# Patient Record
Sex: Male | Born: 1935 | Race: White | Hispanic: No | Marital: Married | Smoking: Former smoker
Health system: Southern US, Community
[De-identification: ages and names within clinical notes are randomized; demographics above are authoritative.]

## PROBLEM LIST (undated history)

## (undated) DIAGNOSIS — I10 Essential (primary) hypertension: Secondary | ICD-10-CM

## (undated) DIAGNOSIS — I471 Supraventricular tachycardia, unspecified: Secondary | ICD-10-CM

## (undated) DIAGNOSIS — Z860100 Personal history of colon polyps, unspecified: Secondary | ICD-10-CM

## (undated) DIAGNOSIS — M1990 Unspecified osteoarthritis, unspecified site: Secondary | ICD-10-CM

## (undated) DIAGNOSIS — Z9289 Personal history of other medical treatment: Secondary | ICD-10-CM

## (undated) DIAGNOSIS — J302 Other seasonal allergic rhinitis: Secondary | ICD-10-CM

## (undated) DIAGNOSIS — C61 Malignant neoplasm of prostate: Secondary | ICD-10-CM

## (undated) DIAGNOSIS — K219 Gastro-esophageal reflux disease without esophagitis: Secondary | ICD-10-CM

## (undated) DIAGNOSIS — K449 Diaphragmatic hernia without obstruction or gangrene: Secondary | ICD-10-CM

## (undated) DIAGNOSIS — E7800 Pure hypercholesterolemia, unspecified: Secondary | ICD-10-CM

## (undated) DIAGNOSIS — Z950 Presence of cardiac pacemaker: Secondary | ICD-10-CM

## (undated) DIAGNOSIS — L309 Dermatitis, unspecified: Secondary | ICD-10-CM

## (undated) DIAGNOSIS — I440 Atrioventricular block, first degree: Secondary | ICD-10-CM

## (undated) HISTORY — DX: Supraventricular tachycardia: I47.1

## (undated) HISTORY — DX: Pure hypercholesterolemia, unspecified: E78.00

## (undated) HISTORY — DX: Supraventricular tachycardia, unspecified: I47.10

## (undated) HISTORY — DX: Essential (primary) hypertension: I10

## (undated) HISTORY — PX: AV NODE ABLATION: SHX1209

## (undated) HISTORY — DX: Personal history of colonic polyps: Z86.010

## (undated) HISTORY — DX: Malignant neoplasm of prostate: C61

## (undated) HISTORY — PX: PROSTATECTOMY: SHX69

## (undated) HISTORY — DX: Unspecified osteoarthritis, unspecified site: M19.90

## (undated) HISTORY — DX: Personal history of colon polyps, unspecified: Z86.0100

## (undated) HISTORY — PX: CATARACT EXTRACTION W/ INTRAOCULAR LENS  IMPLANT, BILATERAL: SHX1307

## (undated) HISTORY — DX: Atrioventricular block, first degree: I44.0

## (undated) HISTORY — DX: Dermatitis, unspecified: L30.9

## (undated) HISTORY — DX: Diaphragmatic hernia without obstruction or gangrene: K44.9

## (undated) HISTORY — PX: TONSILLECTOMY: SUR1361

---

## 1995-07-26 DIAGNOSIS — C61 Malignant neoplasm of prostate: Secondary | ICD-10-CM

## 1995-07-26 HISTORY — DX: Malignant neoplasm of prostate: C61

## 1995-07-26 HISTORY — PX: PROSTATECTOMY: SHX69

## 1999-01-27 ENCOUNTER — Inpatient Hospital Stay (HOSPITAL_COMMUNITY): Admission: EM | Admit: 1999-01-27 | Discharge: 1999-01-28 | Payer: Self-pay | Admitting: Emergency Medicine

## 2001-02-09 ENCOUNTER — Ambulatory Visit (HOSPITAL_COMMUNITY): Admission: RE | Admit: 2001-02-09 | Discharge: 2001-02-10 | Payer: Self-pay | Admitting: Internal Medicine

## 2004-07-06 ENCOUNTER — Encounter: Admission: RE | Admit: 2004-07-06 | Discharge: 2004-07-06 | Payer: Self-pay | Admitting: Family Medicine

## 2005-05-02 ENCOUNTER — Ambulatory Visit: Payer: Self-pay | Admitting: Internal Medicine

## 2005-06-17 ENCOUNTER — Ambulatory Visit: Payer: Self-pay | Admitting: Internal Medicine

## 2005-07-01 ENCOUNTER — Ambulatory Visit: Payer: Self-pay | Admitting: Internal Medicine

## 2005-07-07 ENCOUNTER — Ambulatory Visit: Payer: Self-pay | Admitting: Internal Medicine

## 2005-07-07 ENCOUNTER — Ambulatory Visit (HOSPITAL_COMMUNITY): Admission: AD | Admit: 2005-07-07 | Discharge: 2005-07-08 | Payer: Self-pay | Admitting: Internal Medicine

## 2005-07-12 ENCOUNTER — Ambulatory Visit: Payer: Self-pay | Admitting: Cardiovascular Disease

## 2005-07-12 ENCOUNTER — Ambulatory Visit: Payer: Self-pay

## 2005-07-25 HISTORY — PX: COLONOSCOPY: SHX174

## 2005-07-25 LAB — HM COLONOSCOPY

## 2005-08-23 ENCOUNTER — Ambulatory Visit: Payer: Self-pay | Admitting: Internal Medicine

## 2005-10-17 ENCOUNTER — Encounter: Payer: Self-pay | Admitting: Pulmonary Disease

## 2006-03-06 ENCOUNTER — Ambulatory Visit: Payer: Self-pay | Admitting: Internal Medicine

## 2006-03-09 ENCOUNTER — Ambulatory Visit: Payer: Self-pay | Admitting: Cardiology

## 2006-03-10 ENCOUNTER — Ambulatory Visit: Payer: Self-pay

## 2006-03-22 ENCOUNTER — Ambulatory Visit: Payer: Self-pay | Admitting: Internal Medicine

## 2006-03-31 ENCOUNTER — Ambulatory Visit: Payer: Self-pay | Admitting: Family Medicine

## 2006-04-14 ENCOUNTER — Ambulatory Visit (HOSPITAL_COMMUNITY): Admission: RE | Admit: 2006-04-14 | Discharge: 2006-04-14 | Payer: Self-pay | Admitting: Family Medicine

## 2006-05-23 ENCOUNTER — Ambulatory Visit: Payer: Self-pay | Admitting: Family Medicine

## 2006-10-04 ENCOUNTER — Ambulatory Visit: Payer: Self-pay | Admitting: Family Medicine

## 2006-10-20 ENCOUNTER — Ambulatory Visit: Payer: Self-pay | Admitting: Internal Medicine

## 2007-07-13 ENCOUNTER — Ambulatory Visit: Payer: Self-pay | Admitting: Family Medicine

## 2007-10-09 ENCOUNTER — Ambulatory Visit: Payer: Self-pay | Admitting: Family Medicine

## 2008-03-18 ENCOUNTER — Encounter: Payer: Self-pay | Admitting: Pulmonary Disease

## 2008-03-18 ENCOUNTER — Ambulatory Visit: Payer: Self-pay | Admitting: Family Medicine

## 2008-04-14 ENCOUNTER — Encounter: Payer: Self-pay | Admitting: Pulmonary Disease

## 2008-04-18 DIAGNOSIS — I471 Supraventricular tachycardia, unspecified: Secondary | ICD-10-CM | POA: Insufficient documentation

## 2008-04-18 DIAGNOSIS — R001 Bradycardia, unspecified: Secondary | ICD-10-CM | POA: Insufficient documentation

## 2008-04-18 DIAGNOSIS — I10 Essential (primary) hypertension: Secondary | ICD-10-CM | POA: Insufficient documentation

## 2008-04-21 ENCOUNTER — Ambulatory Visit: Payer: Self-pay | Admitting: Pulmonary Disease

## 2008-04-21 DIAGNOSIS — J309 Allergic rhinitis, unspecified: Secondary | ICD-10-CM | POA: Insufficient documentation

## 2008-04-21 DIAGNOSIS — R05 Cough: Secondary | ICD-10-CM

## 2008-04-21 DIAGNOSIS — E785 Hyperlipidemia, unspecified: Secondary | ICD-10-CM | POA: Insufficient documentation

## 2008-04-21 DIAGNOSIS — R059 Cough, unspecified: Secondary | ICD-10-CM | POA: Insufficient documentation

## 2008-05-05 ENCOUNTER — Ambulatory Visit: Payer: Self-pay | Admitting: Pulmonary Disease

## 2008-05-09 ENCOUNTER — Ambulatory Visit: Payer: Self-pay | Admitting: Pulmonary Disease

## 2008-06-06 ENCOUNTER — Ambulatory Visit: Payer: Self-pay | Admitting: Internal Medicine

## 2008-07-25 HISTORY — PX: TESTICLE SURGERY: SHX794

## 2008-08-01 ENCOUNTER — Ambulatory Visit: Payer: Self-pay | Admitting: Internal Medicine

## 2008-08-26 ENCOUNTER — Ambulatory Visit: Payer: Self-pay | Admitting: Family Medicine

## 2008-09-10 ENCOUNTER — Encounter (INDEPENDENT_AMBULATORY_CARE_PROVIDER_SITE_OTHER): Payer: Self-pay | Admitting: Urology

## 2008-09-10 ENCOUNTER — Ambulatory Visit (HOSPITAL_BASED_OUTPATIENT_CLINIC_OR_DEPARTMENT_OTHER): Admission: RE | Admit: 2008-09-10 | Discharge: 2008-09-10 | Payer: Self-pay | Admitting: Urology

## 2008-12-09 ENCOUNTER — Ambulatory Visit: Payer: Self-pay | Admitting: Family Medicine

## 2009-09-24 ENCOUNTER — Encounter: Admission: RE | Admit: 2009-09-24 | Discharge: 2009-09-24 | Payer: Self-pay | Admitting: Allergy and Immunology

## 2009-12-15 ENCOUNTER — Ambulatory Visit: Payer: Self-pay | Admitting: Family Medicine

## 2010-11-09 LAB — CBC
HCT: 48.2 % (ref 39.0–52.0)
Hemoglobin: 16.1 g/dL (ref 13.0–17.0)
MCHC: 33.5 g/dL (ref 30.0–36.0)
MCV: 90.1 fL (ref 78.0–100.0)
Platelets: 182 10*3/uL (ref 150–400)
RBC: 5.35 MIL/uL (ref 4.22–5.81)
RDW: 13.2 % (ref 11.5–15.5)
WBC: 6.7 10*3/uL (ref 4.0–10.5)

## 2010-11-09 LAB — BASIC METABOLIC PANEL
BUN: 21 mg/dL (ref 6–23)
CO2: 30 mEq/L (ref 19–32)
Calcium: 10.1 mg/dL (ref 8.4–10.5)
Chloride: 101 mEq/L (ref 96–112)
Creatinine, Ser: 1.04 mg/dL (ref 0.4–1.5)
GFR calc Af Amer: >60 mL/min (ref >60)
GFR calc non Af Amer: >60 mL/min (ref >60)
Glucose, Bld: 69 mg/dL — ABNORMAL LOW (ref 70–99)
Potassium: 4.6 mEq/L (ref 3.5–5.1)
Sodium: 139 mEq/L (ref 135–145)

## 2010-11-09 LAB — URINALYSIS, ROUTINE W REFLEX MICROSCOPIC
Bilirubin Urine: NEGATIVE
Glucose, UA: NEGATIVE mg/dL
Hgb urine dipstick: NEGATIVE
Ketones, ur: NEGATIVE mg/dL
Nitrite: NEGATIVE
Protein, ur: NEGATIVE mg/dL
Specific Gravity, Urine: 1.005 (ref 1.005–1.030)
Urobilinogen, UA: 0.2 mg/dL (ref 0.0–1.0)
pH: 5.5 (ref 5.0–8.0)

## 2010-12-07 NOTE — Procedures (Signed)
[facility]                              EXERCISE TREADMILL   NAME:[name], [name]                     MRN:          [id number]  DATE:08/01/2008                            DOB:          08-09-1931    PROCEDURE PERFORMED:  Exercise treadmill testing.   INDICATIONS:  Symptomatic palpitations and exercise-related dizziness  and lightheadedness.   INTRODUCTION:  The patient is a very pleasant 77 year old man with a  long history of SVT, status post ablation x2.  He has marked first-  degree heart block and PR prolongation of 500 milliseconds.  The patient  has recently developed exercise-related dizziness, the etiology of which  is unclear.  He is now referred for exercise treadmill testing, so that  we can see if we can reproduce his symptoms and better understand what  the mechanism of that might be.   PROCEDURE:  After informed consent was obtained, the patient was prepped  in the usual manner.  He was placed on the treadmill where his initial  heart rate was in the 60s and the blood pressure was 170/100.  He  exercised on a Bruce protocol completing stage III, a total of 9 minutes  exercise duration.  The patient's heart rate increased normally until  approximately 8 minutes into exercise when he went into atrial  fibrillation with a rapid ventricular response.  His blood pressure  which initially increased up to over 200 mmHg remained stable and he  after 9 minutes was allowed to stop exercising secondary to MB  discretion and that he had reached his target heart rate.  He was  allowed to recover in the usual manner and within 2 minutes of rest, the  patient went back into sinus rhythm.  His blood pressure decreased in  the normal manner.  His EKG segments demonstrated no acute ST-T wave  changes consistent with ischemia.   COMPLICATIONS:  There were no immediate procedure complications.   RESULTS:  This exercise treadmill test demonstrates no  evidence of  inducible ischemia.  It does demonstrate inducible AFib which stopped  spontaneously within a couple of minutes of exercise completion.  It  also demonstrates a somewhat hypertensive response to exercise.     Doylene Canning. Ladona Ridgel, MD  Electronically Signed    GWT/MedQ  DD: 08/01/2008  DT: 08/02/2008  Job #: [id number]

## 2010-12-07 NOTE — Op Note (Signed)
NAME:  [name], [name]              ACCOUNT NO.:  [id number]   MEDICAL RECORD NO.:  [id number]          PATIENT TYPE:  AMB   LOCATION:  [facility]                         FACILITY:  [facility]   PHYSICIAN:  Courtney Paris, M.D.DATE OF BIRTH:  Sep 30, 1931   DATE OF PROCEDURE:  09/10/2008  DATE OF DISCHARGE:                               OPERATIVE REPORT   PREOPERATIVE DIAGNOSIS:  Left spermatocele.   POSTOPERATIVE DIAGNOSES:  Left spermatocele.   OPERATION:  Left spermatocelectomy.   ANESTHESIA:  General.   SURGEON:  Courtney Paris, M.D.   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  He has had no  evidence of disease since then.  He enters now for an enlarging left  scrotal mass which, on ultrasound, seems to be a spermatocele.  It is  now 10 cm and he wants to have this corrected at this time.   The patient was placed on the operating table in the supine position.  After satisfactory induction of general endotracheal anesthesia, was  prepped and draped in the usual sterile fashion.  He was given IV Ancef.  Time-out was then performed and the patient and the procedure then  reconfirmed.   A midline incision was made over the left hemiscrotal compartment.  The  testis and large spermatocele superior to the testis was delivered.  The  cystic degeneration of the epididymis was noted.  This was completely  excised off the cord, down to the base, but the epididymis was not  removed.  The base was ligated with a 2-0 chromic tie and the sac sent  for pathological examination.   Hemostasis was carefully achieved as we were dissecting with the Bovie.  I placed the left testis back into the left hemiscrotum.  It looked  quite normal.  Closed the tunica vaginalis with a running 3-0 chromic  catgut suture and interrupted 4-0 chromic catgut for the skin.  5 mL of  0.25% Marcaine was injected into the  incision for postoperative pain  relief and  collodion applied.  Light fluff dressings were applied and a  scrotal support.   Sponge, needle and instrument counts were correct.   He was taken to the recovery room in good condition to be later  discharged as an outpatient.      Courtney Paris, M.D.  Electronically Signed    HMK/MEDQ  D:  09/10/2008  T:  09/10/2008  Job:  [id number]

## 2010-12-07 NOTE — Assessment & Plan Note (Signed)
[facility]                         ELECTROPHYSIOLOGY OFFICE NOTE   NAME:BARNESEzekiah, Massie                     MRN:          [id number]  DATE:06/06/2008                            DOB:          1932-04-28    Mr. Mcnulty returns today for followup.  He is a very pleasant 76-year-  old male with a history of SVT status post catheter ablation x2.  He has  prolongation of the PR interval, which continues to prolong.  His PR  interval today was 520 milliseconds.  The patient has not been seen by  me in over a year and half.  He has been stable, but notes over the last  several months, he has had increasing dyspnea and dizziness when he  walks uphill.  The patient plays golf and has a pull cart that he pulls  his golf uphill and when he gets this, he feels dizzy and lightheaded,  and is bothered by a cough for which he has been evaluated by the  pulmonologist, not clear quite with the etiologies.  He had no other  specific complaints today.   MEDICATIONS:  1. Pravachol 40 a day.  2. Norvasc 5 a day.  3. Allegra 180 a day.  4. Multivitamin.  5. Aspirin 81 a day.  6. Fish oil.  7. Astelin nasal spray.   PHYSICAL EXAMINATION:  GENERAL:  The patient is a pleasant 77 year old  man in no distress.  VITAL SIGNS:  Blood pressure was 126/82, the pulse is 48 and regular,  the respirations were 18, and the weight was 161 pounds.  NECK:  No jugular venous distention.  LUNGS:  Clear bilaterally to auscultation.  No wheezes, rales, or  rhonchi are present.  There is no increased work of breathing.  CARDIOVASCULAR:  Regular bradycardia.  Normal S1 and S2.  ABDOMEN:  Soft and nontender.  EXTREMITIES:  No edema.   The EKG demonstrated marked sinus bradycardia at 48 beats per minute.  The PR interval was previously noted at 520 milliseconds.   IMPRESSION:  1. Dyspnea with exertion and dizziness, question secondary to      atrioventricular Wenckebach.  2. History  of hypertension.  3. Dyslipidemia.   DISCUSSION:  Mr. Boger appeared to be present when he is exerting  himself.  It is unclear what is actually going on.  I have asked that he  undergo exercise treadmill testing to see what  happens with his AV conduction when he exercises.  I suspect he has  diastolic MR from his  long PR interval and he may ultimately require permanent pacemaker  insertion.  I will plan to see the patient back in the office after his  treadmill test.     Doylene Canning. Ladona Ridgel, MD  Electronically Signed    GWT/MedQ  DD: 06/06/2008  DT: 06/07/2008  Job #: [id number]

## 2010-12-10 NOTE — Procedures (Signed)
[facility]  Patient:    [name], [name]                     MRN: [id number] Proc. Date: 02/09/01 Adm. Date:  [id number] Attending:  Lewayne Bunting CC:         Ronnald Nian, M.D.  Madolyn Frieze Jens Som, M.D. [facility]   Procedure Report  PROCEDURE:  Electrophysiologic study and radiofrequency catheter ablation of inducible AV node reentrant tachycardia.  INTRODUCTION:  The patient is a very pleasant 77 year old man with a history of hypertension and SVT.  He has a long PR interval at baseline.  His family history is notable for hypertension.  He has had multiple episodes of SVT, increasing in frequency and severity over the last several months, and is now referred for catheter ablation.  DESCRIPTION OF PROCEDURE:  After informed consent was obtained, the patient was taken to the diagnostic EP lab in the fasted state.  After the usual preparation and draping, intravenous fentanyl and midazolam were given for sedation.  A 6 French hexapolar catheter was inserted percutaneously in the right jugular vein and advanced to the coronary sinus.  A 5 French quadripolar catheter was inserted percutaneously in the right femoral vein and advanced to the RV apex.  A 5 French quadripolar catheter was inserted percutaneously in the right femoral vein and advanced to this His bundle region.  After measurement of the basic intervals, rapid ventricular pacing was carried out from the RV apex, and this demonstrated VA dissociation.  This was at a cycle length of 600 milliseconds.  Next, rapid atrial pacing was carried out from the coronary sinus at a pacing cycle length of 490 milliseconds and stepwise decreased down to 420 milliseconds, where AV Wenckebach was observed.  During rapid atrial pacing, the PR interval remained longer than the RR interval, but there was no inducible SVT.  Next, programmed atrial stimulation was carried out from the coronary sinus at a basic  drive cycle length of 562 milliseconds. The S1-S2 interval was stepwise decreased down to 400 milliseconds, and the AV node ERP was observed.  At this point isoproterenol was given at 2 mcg/min. and additional rapid atrial pacing was carried out.  At this time, the AV Wenckebach cycle length was 350 milliseconds.  Once again the PR interval was greater than the RR interval, but there was no SVT.  Programmed atrial stimulation was carried out at a basic drive cycle length of 130 milliseconds and then stepwise decreased down to 300 milliseconds, where the ERP of the AV node was observed.  Next, rapid ventricular pacing was carried out from the RV apex at a pacing cycle length of 500 milliseconds and stepwise decreased down to 400 milliseconds, where a narrow QRS tachycardia was induced.  This was a short VA tachycardia with earliest activation occurring along the left portion of the His bundle region.  This was adjacent to the proximal coronary sinus catheter.  PVCs placed at the time of His bundle refractoriness did not demonstrate pre-excitation, and the atrial activation sequence during SVT was VAV.  At this point, the ablation catheter was maneuvered into Kochs triangle and a total of five RF energy applications were delivered from the sites 6 through 9 in Kochs triangle.  This resulted in an accelerated junctional rhythm.  At this point, additional rapid ventricular pacing and rapid atrial pacing were carried out for over 30 minutes.  This was on isoproterenol. There was no inducible  SVT, and the PR interval was now less than the RR interval.  At this point, the catheters were removed, hemostasis assured, and the patient returned to his room in satisfactory condition.  COMPLICATIONS:  None.  RESULTS:  A. BASELINE ELECTROCARDIOGRAM:  The baseline EKG demonstrates normal sinus    rhythm with first degree AV block.  B. BASELINE INTERVALS:  Sinus node cycle length was 1311 milliseconds.   The    PR interval was 320 milliseconds, the QRS duration 100 milliseconds, the    AH interval 236 milliseconds, and the HV interval 59 milliseconds.  C. RAPID ATRIAL PACING:  Rapid atrial pacing was carried out from the coronary    sinus, demonstrating an AV Wenckebach cycle length of 420 milliseconds.  On    isoproterenol, the AV Wenckebach cycle length was 350 milliseconds.  D. PROGRAMMED ATRIAL STIMULATION:  Programmed atrial stimulation was carried    out from the coronary sinus at basic drive cycle length of 604    milliseconds.  The S1-S2 interval was stepwise decreased down to 400    milliseconds, where the ERP of the AV node was observed.  It should be    noted that during programmed atrial stimulation, there was no evidence of    any fast pathway antegrade conduction until Isuprel was initiated.  E. RAPID VENTRICULAR PACING:  Rapid ventricular pacing was carried out from    the RV apex at a pacing cycle length of 600 milliseconds, and this    demonstrated VA dissociation at baseline.  Following isoproteronol    infusion, rapid atrial pacing was carried out and stepwise decreased down    to 400 milliseconds, where SVT was initiated.  Following catheter ablation    of SVT, the AV Wenckebach cycle length on isoproterenol was 330    milliseconds.  F. PROGRAMMED VENTRICULAR STIMULATION:  Programmed ventricular stimulation    during isoproterenol infusion at a basic drive cycle length of 540    milliseconds was carried out.  The S1-S2 interval was stepwise decreased    down to 240 milliseconds, where ventricular refractoriness was observed.  G. ARRHYTHMIAS OBSERVED:  AV node reentrant tachycardia.  Initiation:  Rapid    ventricular pacing.  Duration:  Sustained.  Termination:  With catheter    ablation and pacing.  H. MAPPING:  Mapping of the patients SVT demonstrated the usual Kochs    triangle anatomy.  I. RADIOFREQUENCY ABLATION:  A total of five RF energy applicatoins  were    subsequently delivered to the slow pathway region, which resulted in     accelerated junctional rhythm and the termination and inability to reinduce    SVT.  CONCLUSION:  This study demonstrates successful RF catheter ablation in a patient with symptomatic, recurrent, medically refractory SVT, with a total of five RF energy applications delivered to Kochs triangle, rendering the tachycardia noninducible.  In addition, there was no residual dual AV nodal physiology. DD:  02/09/01 TD:  02/09/01 Job: [id number]

## 2010-12-10 NOTE — Assessment & Plan Note (Signed)
 [facility]                           ELECTROPHYSIOLOGY OFFICE NOTE   NAME:BARNESAnvay, [name]                     MRN:          [id number]  DATE:03/22/2006                            DOB:          01-11-1932    [name] returns today for followup.  He is a very pleasant 77 year old  male with a history of SVT, status post two ablations with his most recent P-  R interval approximately 400 Msec.  The patient had recurrent chest pain  after I saw him two weeks ago and underwent stress testing by Dr. Antoine Poche  which demonstrated no ischemia or scar, preserved left ventricular function.  The patient did have what looks like Wenckebach and sinus rhythm while he  was exercising.  We cannot exclude a short RP tachycardia simply because we  do not have any evidence where the heart racing actually abruptly stopped or  slowed down and he has baseline marked first-degree AV block so it is  difficult to tell whether his short RP interval is from SVT or from sinus  tachycardia with marked first-degree AV block.  That all being said, he  returns for followup and overall has been well except he does continue to  have palpitations while he is on the golf course.  He denies chest pain.  He  denies peripheral edema.   PHYSICAL EXAMINATION:  GENERAL:  He is a pleasant 77 year old man in no  acute distress.  VITAL SIGNS: Blood pressure 133/75, pulse 64 and regular, respirations 18.  NECK:  No jugular venous distension.  LUNGS:  Clear bilaterally to auscultation.  CARDIOVASCULAR:  Regular rate and rhythm. Normal S1 and S2.  EXTREMITIES:  Demonstrate no edema.   LABORATORY DATA:  EKG demonstrates sinus rhythm of marked first-degree AV  block.   IMPRESSION:  1. Recurrent supraventricular tachycardia.  2. Palpitations.  3. Chest pain with exertion.  4. Status post negative exercise treadmill test.   DISCUSSION:  I suspect much of the patient's symptoms are related  to  intermittent Wenckebach with exertion though I cannot rule out bursts of  nonsustained SVT.  He does have a very markedly long P-R interval but  because he has really not been that much in the way of symptomatic.  I would  like to recommend a period of watchful waiting. If he develops recurrent  palpitations or sensation of rapid heart racing,  then I would recommend him  wear a 30-day monitor.  I will plan to see this patient back in several  months.                                   Doylene Canning. Ladona Ridgel, MD  GWT/MedQ  DD:  03/22/2006 DT:  03/23/2006 Job #:  [id number]   cc:   Sharlot Gowda, MD

## 2010-12-10 NOTE — Discharge Summary (Signed)
[name], [name] NO.:  [id number]   MEDICAL RECORD NO.:  [id number]          PATIENT TYPE:  OIB   LOCATION:  6524                         FACILITY:  [facility]   PHYSICIAN:  Duke Salvia, M.D.  DATE OF BIRTH:  05-Jul-1932   DATE OF ADMISSION:  07/07/2005  DATE OF DISCHARGE:  07/08/2005                                 DISCHARGE SUMMARY   DISCHARGING/ATTENDING:  Dr. Sherryl Manges.   ELECTROPHYSIOLOGIST:  Dr. Lewayne Bunting.   PRIMARY CARE GIVER:  Dr. Sharlot Gowda.   PRINCIPAL DIAGNOSES:  1.  Recurrence of supraventricular tachyarrhythmia (patient is status post      radiofrequency catheter ablation of AV nodal reentry tachycardia in the      past).  2.  Symptomatic tachyarrhythmia with chest pressure, dizziness, and dyspnea.  3.  Discharging day one, status post electrophysiology study, radiofrequency      catheter ablation of AV nodal reentry tachycardia.   SECONDARY DIAGNOSES:  1.  Dyslipidemia.  2.  Hypertension.   ALLERGIES:  CODEINE.   PROCEDURE THIS ADMISSION:  July 07, 2005:  Electrophysiology study,  radiofrequency catheter ablation of AV nodal reentry tachycardia without  complaint and restoring sinus rhythm.  Isopro was used to induce  supraventricular tachycardia, and slow P wave modification was effected.  The patient has had no post-procedural complications.  He does complain of a  stiff back after being in bed all night.   HISTORY AND PHYSICAL:  [name] is a 77 year old male.  He has a history  in the past of tachypalpitations and in the past underwent electrophysiology  study and a radiofrequency catheter ablation of AV nodal reentry  tachycardia.  This was about four years ago.  The patient has had recurrence  of palpitations; these are symptomatic.  They are increasing in frequency  and severity that last from 5 to 30 minutes.  He has no frank syncope, but  they are associated with shortness of breath and some back pressure.  The  patient returns for followup.  Although his symptoms are not quite so  severe, he is symptomatic with his SVT.  The patient has marked first-degree  AV block and beta-blocker and calcium-blocker therapy is contraindicated.  The patient is at risk for bradycardia after any planned ablation and may  require a pacemaker with a trans of 1 to 100.   HOSPITAL COURSE:  Patient presented electively July 07, 2005.  He  underwent EPS/RFCA with a slow P-wave modification by Dr. Lewayne Bunting.  The  patient is in sinus rhythm.  Heart rate in the 50s.  He has been  asymptomatic 16 hours after procedure.  Oxygen saturating 94% on room air.  The patient has no laboratory studies this admission.   DISCHARGE MEDICATIONS:  1.  Pravachol 40 mg daily.  2.  Norvasc 5 mg daily.  3.  Allegra 180 mg daily.  4.  Aspirin 81 mg daily.  5.  Astelin spray twice daily.   FOLLOWUP:  He has followup at [facility], [address]  Dr. Ladona Ridgel, August 23, 2005, 11:45 in the morning.  He may shower.  He is  to call [phone] if he experiences pain or swelling at the cath site.  He is  asked not to drive for the next two days and not lift any heavy weight of  more than 10 pounds for the next two weeks.   DISCHARGE DIET:  Low-salt, low-cholesterol diet.   Thirty minutes for dictation and discussion with patient.      Maple Mirza, P.A.    ______________________________  Duke Salvia, M.D.    GM/MEDQ  D:  07/08/2005  T:  07/11/2005  Job:  [id number]   cc:   Doylene Canning. Ladona Ridgel, M.D.  [address]  [address]  [place]  [place] [address]   Sharlot Gowda, M.D.  Fax: [phone]

## 2010-12-10 NOTE — Assessment & Plan Note (Signed)
[facility]                         ELECTROPHYSIOLOGY OFFICE NOTE   NAME:BARNESSecundino, Ellithorpe                     MRN:          [id number]  DATE:10/20/2006                            DOB:          1932-02-12    Mr. Petrovic returns today for followup.  He is a very pleasant 77-year-  old man with a history of symptomatic SVT status post electrophysiologic  setting catheter ablation x2, who had very marked first degree block  with a PR interval for approximately 440 milliseconds.  The patient  returns today for followup.  He has recently seen Dr. Susann Givens.  The  patient denies chest pain.  He denies shortness of breath.  He has very  minimal amount of palpitations.  He denies any rapid heart racing above  100 beats per minute.  He does have some sinus node dysfunction along  with his AV conduction system disease.   MEDICATIONS:  Include Pravachol, Norvasc, Allegra, multivitamin,  aspirin, methylsulfonymethane, fish oil, and Astelin nasal spray.   PHYSICAL EXAMINATION:  He is a pleasant, well-appearing man in no acute  distress.  The blood pressure was 129/76, the pulse 62 and regular,  respirations 18.  Weight was 159 pounds.  HEENT:  Normocephalic and atraumatic.  Pupils equal and round.  Oropharynx is moist, sclerae are anicteric.  NECK:  No jugular venous distention.  There is no thyromegaly.  Trachea  is midline.  Carotids are two plus and symmetric.  LUNGS:  No wheezes, rales or rhonchi.  There is no increased work of  breathing.  CARDIOVASCULAR:  Regular rate and rhythm with a soft S1 and a normal S2.   IMPRESSION:  1. Palpitations.  2. Sinus node dysfunction.  3. High-grade first degree block.   DISCUSSION:  Overall, Mr. Sheek is stable.  I have asked that he  continue a period of watchful waiting.  He will continue on his present  medical therapy.  There is presently no indication for a permanent  pacemaker insertion, although there may well  be a time soon rather than  later when this is required.     Doylene Canning. Ladona Ridgel, MD  Electronically Signed    GWT/MedQ  DD: 10/20/2006  DT: 10/20/2006  Job #: [id number]   cc:   Sharlot Gowda, M.D.

## 2010-12-10 NOTE — Discharge Summary (Signed)
[facility]  Patient:    [name], [name]                     MRN: [id number] Adm. Date:  [id number] Disc. Date: 02/10/01 Attending:  Lewayne Bunting Dictator:   Delton See, P.A. CC:         Madolyn Frieze. Jens Som, M.D. [facility]  Ronnald Nian, M.D.   Referring Physician Discharge [facility]  DATE OF BIRTH:  2031-12-15  HISTORY ON ADMISSION:  This is a 77 year old male, a patient of Arlys John S. Jens Som, M.D., and Ronnald Nian, M.D., who was referred to Doylene Canning. Ladona Ridgel, M.D., for evaluation of supraventricular tachycardia.  The patient had his first episode approximately three years ago.  In April of 2002, he developed increasing frequency of symptoms.  He was referred to Doylene Canning. Ladona Ridgel, M.D., for further evaluation.  He has not been on AV nodal blocking drugs secondary to his baseline first degree heart block with a PR interval of approximately 300 msec.  Radiofrequency ablation was discussed and arrangements were made to admit the patient for that procedure.  PAST MEDICAL HISTORY:  Significant for hypertension.  SOCIAL HISTORY:  The patient is married.  He is retired.  He does not use alcohol or tobacco at this time.  ALLERGIES:  No known drug allergies.  HOSPITAL COURSE:  As noted, this patient was admitted to [facility] for radiofrequency ablation of supraventricular tachycardia. He was admitted on February 09, 2001.  The procedure was performed on that day. There were no immediate complications and the procedure appeared to be successful.  Arrangements were made to discharge the patient home the following day.  Prior to discharge, the patient did have some bradycardia and some occasional accelerated junctional ventricular rhythms.  These were reviewed by Veneda Melter, M.D., prior to discharge.  It was felt that the patient was probably okay for discharge.  Follow-up was recommended with Doylene Canning. Ladona Ridgel, M.D., at which time the  patient would have an EKG to further evaluate his heart rhythm.  DISCHARGE MEDICATIONS: 1. Norvasc 5 mg daily. 2. Altace 5 mg daily. 3. Pravachol 20 mg daily. 4. Clarinex 5 mg p.r.n. 5. Aspirin 81 mg daily.  SPECIAL INSTRUCTIONS:  It was recommended that the patient be on antibiotics prior to any procedures, including dental work, bowel procedures, or surgery for at least three months.  ACTIVITY:  He was to avoid any heavy lifting or strenuous activity for four days.  He was not to drive for two days.  DIET:  He was to be on a low-salt, low-fat, low-cholesterol diet.  WOUND CARE:  He was to call the office if there were any problems with his groin.  FOLLOW-UP:  He was to follow up with his primary physician as needed or as scheduled.  Doylene Canning. Ladona Ridgel, M.D., was to see the patient on March 12, 2001, at 3:30 p.m.  PROBLEM LIST AT THE TIME OF DISCHARGE: 1. Status post radiofrequency ablation for supraventricular tachycardia. 2. History of hypertension. 3. History of elevated lipids. 4. Postprocedural bradycardia, asymptomatic, as well as occasional accelerated    junctional ventricular rhythms.  The patient was hemodynamically stable at    the time of discharge. DD:  02/10/01 TD:  02/10/01 Job: [id number]

## 2010-12-10 NOTE — Assessment & Plan Note (Signed)
[facility]                              CARDIOLOGY OFFICE NOTE   NAME:[name], [name]                     MRN:          [id number]  DATE:03/09/2006                            DOB:          09/23/1931    PRIMARY PHYSICIAN:  Dr. Sharlot Gowda.   CARDIOLOGIST:  Dr. Ladona Ridgel.   REASON FOR PRESENTATION:  Patient with neck discomfort.   HISTORY OF PRESENT ILLNESS:  The patient is a very pleasant 77 year old  gentleman who saw Dr. Ladona Ridgel earlier this week for palpitations.  He has had  a long history of supraventricular tachycardia.  He is also describing some  chest pressure that had happened about a month before.  He comes back today  added to my scheduled.  He was walking on the golf course yesterday.  He had  pain in the back of his neck.  He had a strange feeling in his chest that  was not like his palpitations.  He felt his heart was a little bit  irregular.  He then had a little difficulty walking up the hill and felt  somewhat short of breath and a little fatigued.  He was slightly dizzy  bending over to pick things up.  He did not have any presyncope or syncope.  He did not have any of his classic palpitations.  He had no chest pressure,  neck discomfort, or arm discomfort.  He felt a little bit poorly the rest of  the day and feels better today.  He had no PND or orthopnea.   PAST MEDICAL HISTORY:  1. Hypertension.  2. Supraventricular tachycardia, ablated.  3. Bradycardia.   ALLERGIES:  CODEINE.   MEDICATIONS:  1. Pravachol 40 mg.  2. Norvasc 5 mg.  3. Allegra 180 mg.  4. Multivitamin.  5. Aspirin 81 mg.  6. Glucosamine chondroitin.  7. Fish oil.  8. Flonase.   REVIEW OF SYSTEMS:  As stated in HPI, otherwise negative for all other  systems.   PHYSICAL EXAMINATION:  GENERAL:  The patient is in no distress.  VITALS:  Blood pressure 140/86.  Heart rate 54 and regular.  Weight 157  pounds.  HEENT:  Eyelids unremarkable.  Pupils  equal round and reactive to light.  Fundi not visualized.  NECK:  No jugular venous distention.  Carotid upstrokes brisk and symmetric.  No bruits or thyromegaly.  LYMPHATICS:  No adenopathy.  LUNGS:  Clear to auscultation bilaterally.  BACK:  No costovertebral angle tenderness.  CHEST:  Unremarkable.  HEART:  PMI not displaced or sustained.  S1 and S2 within normal limits.  No  S3.  No murmurs.  ABDOMEN:  Flat.  Positive bowel sounds.  Normal in frequency and pitch.  No  bruits.  No rebound, no guarding.  Normal midline pulse.  No mass or  organomegaly.  SKIN:  No rashes.  EXTREMITIES:  Pulses 2+, no edema.  EKG sinus bradycardia, rate 54.  Profound first-degree AV block, otherwise  intervals within normal limits.  No acute ST wave changes.   ASSESSMENT AND PLAN:  1. Neck discomfort.  The patient had  neck discomfort and some other      atypical symptoms.  He has minimal cardiovascular risk factors.  He has      had two negative stress perfusion studies in 2000 and 2002.  At this      point I think there is the possibility of coronary disease but think      the __________  probability is only moderate.  Therefore, he would do      best with a perfusion study.  I do not want to give adenosine with his      bradyarrhythmia but will give him an exercise test.  He had been able      to get his heart rate up to 135 a few years ago.  If he cannot exercise      adequately, we will probably have to do a cardiac catheterization.  2. Bradyarrhythmia.  He will follow up soon with Dr. Ladona Ridgel for this and      for his SVT.  3. Dr. Ladona Ridgel will see the patient back.                               Rollene Rotunda, MD, [facility]    JH/MedQ  DD:  03/09/2006  DT:  03/09/2006  Job #:  [id number]   cc:   Doylene Canning. Ladona Ridgel, MD  Sharlot Gowda, MD

## 2010-12-10 NOTE — Op Note (Signed)
[name], [name] NO.:  [id number]   MEDICAL RECORD NO.:  [id number]          PATIENT TYPE:  OIB   LOCATION:  6524                         FACILITY:  [facility]   PHYSICIAN:  Doylene Canning. Ladona Ridgel, M.D.  DATE OF BIRTH:  1931/12/10   DATE OF PROCEDURE:  07/07/2005  DATE OF DISCHARGE:  07/08/2005                                 OPERATIVE REPORT   PROCEDURE PERFORMED:  Electrophysiologic study and radiofrequency catheter  ablation of AV node reentrant tachycardia.   CARDIOLOGIST:  Doylene Canning. Ladona Ridgel, M.D.   INTRODUCTION:  The patient is a 77 year old male with a history of recurrent  tachy palpitations and documented SVT in the past.  In addition, he has had  a prolonged PR interval and underwent electrophysiologic study and catheter  ablation of his SVT several years ago.  At that time inducible AV node  reentry tachycardia at 160 beats per minute and slow pathway modification  was carried out, which demonstrated successful ablation. Following this, the  patient did well for several years but developed recurrent palpitations and  had documented SVT at rates of 130 beats per minute.  This appeared to be a  short RP tachycardia.  He is now referred for redo EP study and RF catheter  ablation all in the setting of first-degree A-V block and a PR interval 340  milliseconds.   DESCRIPTION OF PROCEDURE:  After informed consent was obtained, the patient  was taken to the diagnostic EP Lab in the fasting state.  After the usual  preparation and draping, intravenous fentanyl and midazolam was given for  sedation.  A 6-French Hexapolar catheter was inserted percutaneously in the  right jugular vein and advanced to coronary sinus.  A 5-French quadripolar  catheter was inserted percutaneously in the right femoral vein and advanced  to the RV apex.  A 5-French quadripolar catheter was inserted percutaneously  in the right femoral vein and advanced to the his bundle region.  After  measurement of the basic intervals, rapid ventricular pacing was carried out  from the RV apex demonstrating VA dissociation at 600 milliseconds. Next,  rapid atrial pacing was carried out the coronary sinus demonstrating AV  Wenckebach at 550 milliseconds.  Next programmed atrial stimulation was  carried out from the coronary sinus at base drive cycle length of 782  milliseconds, stepwise decreased down to 440 milliseconds where AV node ERP  was observed.  At this point, isoproterenol  was infused at a rate of 2 mcg  per minute and then 3 mcg per minute.  Additional rapid ventricular pacing  was carried out and then stepwise decreased down to 320 milliseconds where  VA Wenckebach was observed.  During rapid ventricular pacemaker the atrial  activation was midline and decremental.  Programmed ventricular stimulation  was carried out from the RV apex on isoproterenol at a base drive cycle  length of 956 milliseconds and stepwise decreased down to 300 milliseconds  where a retrograde AV node ERP was observed.  During programmed ventricular  stimulation, the atrial activation was midline and decremental. Next, rapid  atrial pacing was carried  out from the coronary sinus at a pacing cycle  length of 600 milliseconds and stepwise decreased down to 320 milliseconds  where AV Wenckebach was observed.  During rapid atrial pacing the PR  interval was equal to R interval but there initially no SVT.  Next,  programmed atrial stimulation was carried out the coronary sinus at a base  drive cycle length of 045 milliseconds.  The S1, S2 interval was stepwise  decreased down to 400 milliseconds resulting in the induction of SVT.  During SVT, rapid ventricular pacing demonstrated the _________  conduction  sequence and PVCs did not preexite the atrium.  Mapping demonstrated midline  atrial activation with a very short VA interval.  With all this  demonstrated, the ablation catheter was maneuvered into the  usual slow  pathway region.  This was markedly decreased in size.  A total of 10 RF  energy applications were delivered during which time there were prolonged  episodes of accelerated junctional rhythm.  Initially following ablation  there was recurrent SVT but after the final RF energy application there was  no inducible SVT and the PR interval was not significantly increased.  The  catheters were removed.  Hemostasis was assured, and the patient was  returned to his room in satisfactory condition.   COMPLICATIONS:  There were no immediate procedure complications.   RESULTS:  1.  Baseline ECG:  The baseline ECG demonstrates a sinus rhythm with marked      first-degree A-V block, 1/4 beat baseline intervals, sinus node cycle      length was 1109 milliseconds, the PR interval 362 milliseconds, the QRS      duration 128 milliseconds.  2.  Rapid ventricular pacing:  Rapid atrial pacing was carried out from the      RV apex at baseline, demonstrating VA dissociation.  On isoproterenol      rapid ventricular pacing demonstrated VA Wenckebach cycle length of 400      and 300 milliseconds.  During rapid ventricular pacing, the atrial      activation was midline decremental on isoproterenol.  3.  Programmed ventricular stimulation:  Programmed ventricular stimulation      on isoproterenol was carried out at base drive cycle length 409      milliseconds. The S1 and S2 interval was stepwise decreased down to 300      milliseconds where the retrograde AV node ERP was observed. During      programmed ventricular stimulation, the atrial activation was midline      and decremental.  4.  Rapid atrial pacing:  Rapid atrial pacing was carried out the coronary      sinus in the high right atrium with a base drive cycle length of 811      milliseconds, stepwise decreased down to 550 milliseconds where AV      Wenckebach was observed.  On isoproterenol, additional rapid atrial     pacing was carried out  demonstrating no inducible SVT.  5.  Programmed atrial stimulation:  Programmed atrial stimulation was      initially carried out the coronary sinus in the high right atrium with a      base drive cycle length of 914 milliseconds.  The S1, S2 interval was      stepwise decreased down to 440 msec where AV node ARP was observed.      During programmed atrial stimulation, there were multiple AH jumps and      echo beats noted.  Following ablation echo beats remained.  On      isoproterenol  programmed atrial stimulation at 500-420 resulted in the      initiation of SVT.  6.  Arrhythmias observed:  AV node reentrant tachycardia.  Initiation      programmed atrial stimulation on isoproterenol.  Duration was sustained.      Cycle length was 423 milliseconds and decreased down to 380      milliseconds.  The duration was sustained.  Termination was with rapid      ventricular pacing.  7.  AH mapping:  Mapping of Koch's triangle demonstrated unusually small      Koch's triangle.  8.  Radiofrequency energy application:  A total of 11 RF energy applications      were delivered which resulted in accelerated junctional rhythm.      Following ablation there is no inducible SVT.   CONCLUSION:  Study demonstrates apparent successful slow pathway  modification in a patient with marked first-degree A-V block at baseline  with inducible SVT on isoproterenol.           ______________________________  Doylene Canning. Ladona Ridgel, M.D.     GWT/MEDQ  D:  07/07/2005  T:  07/10/2005  Job:  [id number]   cc:   Sharlot Gowda, M.D.  Fax: [phone]

## 2010-12-10 NOTE — Assessment & Plan Note (Signed)
[facility]                           ELECTROPHYSIOLOGY OFFICE NOTE   NAME:[name], [name]                     MRN:          [id number]  DATE:03/06/2006                            DOB:          1931-11-14    [name] returns today for followup.  He is a very pleasant 77 year old  male with a history of recurrent SVT in the setting of a very long PR  interval.  He underwent initial catheter ablation back in 2002 and then  developed recurrent SVT back in 2006 and had re-do catheter ablation. He  returns today for followup.  He has rare palpitations lasting 10-15 seconds  at a time. He also noted he had one episode of atypical chest pressure  several weeks ago.  He did not seek medical attention. It was not related to  exertion and he remains quite active and has had no recurrent symptoms. He  denies shortness of breath.  He denies nausea or vomiting.   PHYSICAL EXAMINATION:  GENERAL:  On exam is a pleasant, well-appearing 39-  year-old man in no distress.  VITAL SIGNS:  The blood pressure was 144/90, pulse 56 and regular,  respirations 18, weight 156 pounds.  NECK:  No jugular venous distention.  LUNGS:  Clear bilaterally to auscultation.  CARDIOVASCULAR:  Regular bradycardia, normal S1 and split S2.  EXTREMITIES:  No edema.   The EKG demonstrates sinus bradycardia with marked first degree AV block.   IMPRESSION:  1. Recurrent supraventricular tachycardia status post catheter ablation      x2.  2. Recurrent palpitations (short lived and infrequent).  3. Chest pressure (atypical in a patient with multiple cardiac risk      factors).   DISCUSSION:  [name] is stable at the present time.  If he has recurrent  problems with chest pressure, particularly if they become exertional, then I  would recommend proceeding with catheterization.  He has had two stress  tests, the most recent one five years ago which demonstrated no obvious  blockage  although I suspect exercise-induced chest pressure would equal  ischemia until proven otherwise. If his heart racing returns, we would first  having him wear a cardiac monitor to see if we could capture this and I  would probably lean toward pacing him and trying more beta blockers which he  is unable to  take now as his AV conduction would certainly go away with any  additional catheter ablations for supraventricular tachycardia.                                  Doylene Canning. Ladona Ridgel, MD   GWT/MedQ  DD:  03/06/2006  DT:  03/06/2006  Job #:  [id number]   cc:   Sharlot Gowda, MD

## 2010-12-14 ENCOUNTER — Other Ambulatory Visit: Payer: Self-pay | Admitting: Family Medicine

## 2010-12-14 NOTE — Telephone Encounter (Signed)
Called pt explained he had 2 refill that were sent in 09/17/10 to call pharmacy if they dont have it to call me back

## 2010-12-21 ENCOUNTER — Encounter: Payer: Self-pay | Admitting: Family Medicine

## 2010-12-25 ENCOUNTER — Encounter: Payer: Self-pay | Admitting: Family Medicine

## 2010-12-25 DIAGNOSIS — C61 Malignant neoplasm of prostate: Secondary | ICD-10-CM | POA: Insufficient documentation

## 2010-12-25 DIAGNOSIS — Z86010 Personal history of colonic polyps: Secondary | ICD-10-CM | POA: Insufficient documentation

## 2010-12-25 DIAGNOSIS — K449 Diaphragmatic hernia without obstruction or gangrene: Secondary | ICD-10-CM | POA: Insufficient documentation

## 2010-12-25 DIAGNOSIS — E7800 Pure hypercholesterolemia, unspecified: Secondary | ICD-10-CM | POA: Insufficient documentation

## 2010-12-27 ENCOUNTER — Encounter: Payer: Self-pay | Admitting: Family Medicine

## 2010-12-27 ENCOUNTER — Ambulatory Visit (INDEPENDENT_AMBULATORY_CARE_PROVIDER_SITE_OTHER): Payer: Medicare Other | Admitting: Family Medicine

## 2010-12-27 VITALS — BP 140/90 | HR 62 | Ht 67.0 in | Wt 158.0 lb

## 2010-12-27 DIAGNOSIS — Z0000 Encounter for general adult medical examination without abnormal findings: Secondary | ICD-10-CM

## 2010-12-27 DIAGNOSIS — H259 Unspecified age-related cataract: Secondary | ICD-10-CM

## 2010-12-27 DIAGNOSIS — M1990 Unspecified osteoarthritis, unspecified site: Secondary | ICD-10-CM

## 2010-12-27 DIAGNOSIS — J301 Allergic rhinitis due to pollen: Secondary | ICD-10-CM

## 2010-12-27 DIAGNOSIS — M129 Arthropathy, unspecified: Secondary | ICD-10-CM

## 2010-12-27 DIAGNOSIS — E785 Hyperlipidemia, unspecified: Secondary | ICD-10-CM | POA: Insufficient documentation

## 2010-12-27 DIAGNOSIS — I10 Essential (primary) hypertension: Secondary | ICD-10-CM

## 2010-12-27 DIAGNOSIS — Z79899 Other long term (current) drug therapy: Secondary | ICD-10-CM

## 2010-12-27 DIAGNOSIS — Z9849 Cataract extraction status, unspecified eye: Secondary | ICD-10-CM | POA: Insufficient documentation

## 2010-12-27 DIAGNOSIS — Z8546 Personal history of malignant neoplasm of prostate: Secondary | ICD-10-CM

## 2010-12-27 LAB — COMPREHENSIVE METABOLIC PANEL
ALT: 23 U/L (ref 0–53)
BUN: 20 mg/dL (ref 6–23)
CO2: 28 mEq/L (ref 19–32)
Chloride: 103 mEq/L (ref 96–112)
Creat: 1.02 mg/dL (ref 0.50–1.35)
Glucose, Bld: 88 mg/dL (ref 70–99)
Potassium: 3.9 mEq/L (ref 3.5–5.3)
Sodium: 140 mEq/L (ref 135–145)
Total Bilirubin: 0.5 mg/dL (ref 0.3–1.2)
Total Protein: 6.9 g/dL (ref 6.0–8.3)

## 2010-12-27 LAB — POCT URINALYSIS DIPSTICK
Bilirubin, UA: NEGATIVE
Blood, UA: 5
Glucose, UA: NEGATIVE
Leukocytes, UA: NEGATIVE
Nitrite, UA: NEGATIVE
Protein, UA: NEGATIVE
Spec Grav, UA: 1.025
Urobilinogen, UA: NEGATIVE
pH, UA: NEGATIVE

## 2010-12-27 LAB — CBC WITH DIFFERENTIAL/PLATELET
Basophils Absolute: 0 10*3/uL (ref 0.0–0.1)
Eosinophils Absolute: 0.1 10*3/uL (ref 0.0–0.7)
HCT: 46.8 % (ref 39.0–52.0)
Lymphocytes Relative: 13 % (ref 12–46)
Lymphs Abs: 0.9 10*3/uL (ref 0.7–4.0)
MCH: 29.9 pg (ref 26.0–34.0)
Monocytes Absolute: 0.5 10*3/uL (ref 0.1–1.0)
Monocytes Relative: 7 % (ref 3–12)
Neutro Abs: 5.4 10*3/uL (ref 1.7–7.7)
Neutrophils Relative %: 79 % — ABNORMAL HIGH (ref 43–77)
Platelets: 166 10*3/uL (ref 150–400)
RBC: 5.28 MIL/uL (ref 4.22–5.81)
WBC: 6.8 10*3/uL (ref 4.0–10.5)

## 2010-12-27 LAB — LIPID PANEL
Cholesterol: 184 mg/dL (ref 0–200)
HDL: 53 mg/dL (ref >39)
LDL Cholesterol: 108 mg/dL — ABNORMAL HIGH (ref 0–99)
Total CHOL/HDL Ratio: 3.5 Ratio
Triglycerides: 115 mg/dL (ref <150)
VLDL: 23 mg/dL (ref 0–40)

## 2010-12-27 MED ORDER — AMLODIPINE BESYLATE 5 MG PO TABS: 5.0000 mg | ORAL_TABLET | Freq: Every day | ORAL | Status: DC

## 2010-12-27 MED ORDER — RANITIDINE HCL 300 MG PO TABS: 300.0000 mg | ORAL_TABLET | Freq: Every day | ORAL | Status: DC

## 2010-12-27 NOTE — Progress Notes (Signed)
  Subjective:    Patient ID: [name], male    DOB: 1931/08/22, 77 y.o.   MRN: [id number]  HPI He is here for a complete examination. He does have a lesion on his left ear that he would like me to look at. He continues to be followed by his ophthalmologist and does have cataracts. He also continues to have difficulty with his breathing but seems to respond well to ranitidine and Prilosec. He continues to be followed by his cardiologist for bradycardia. His social history was reviewed. His social history was reviewed His allergies seem to be under good control. He has a remote history of prostate cancer dating to 4. He has no difficulty with his hiatus hernia.   Review of Systems  Constitutional: Negative.   HENT: Negative.   Eyes: Positive for visual disturbance.  Respiratory: Positive for chest tightness and shortness of breath.   Cardiovascular: Negative.   Gastrointestinal: Negative.   Genitourinary: Negative.   Musculoskeletal: Positive for arthralgias.  Neurological: Negative.        Objective:   Physical Exam BP 140/90  Pulse 62  Ht 5\' 7"  (1.702 m)  Wt 158 lb (71.668 kg)  BMI 24.75 kg/m2  General Appearance:    Alert, cooperative, no distress, appears stated age  Head:    Normocephalic, without obvious abnormality, atraumatic  Eyes:    PERRL, conjunctiva/corneas clear, EOM's intact, fundi   Not evaluated due to cataracts   Ears:    Normal TM's and external ear canals.the left upper earlobe does have a small healing lesion present on the.   Nose:   Nares normal, mucosa normal, no drainage or sinus   tenderness  Throat:   Lips, mucosa, and tongue normal; teeth and gums normal  Neck:   Supple, no lymphadenopathy;  thyroid:  no   enlargement/tenderness/nodules; no carotid   bruit or JVD  Back:    Spine nontender, no curvature, ROM normal, no CVA     tenderness  Lungs:     Clear to auscultation bilaterally without wheezes, rales or     ronchi; respirations unlabored    Chest Wall:    No tenderness or deformity   Heart:    slow rate and rhythm, S1 and S2 normal, no murmur, rub   or gallop  Breast Exam:    No chest wall tenderness, masses or gynecomastia  Abdomen:     Soft, non-tender, nondistended, normoactive bowel sounds,    no masses, no hepatosplenomegaly  Genitalia:    Normal male external genitalia without lesions.  Testicles without masses.  No inguinal hernias.     Extremities:   No clubbing, cyanosis or edema  Pulses:   2+ and symmetric all extremities  Skin:   Skin color, texture, turgor normal, no rashes or lesions  Lymph nodes:   Cervical, supraclavicular, and axillary nodes normal  Neurologic:   CNII-XII intact, normal strength, sensation and gait; reflexes 2+ and symmetric throughout          Psych:   Normal mood, affect, hygiene and grooming.           Assessment & Plan:  Left ear lesion. Cataracts. Bradycardia. History of prostate cancer. Arthritis. Hypertension. Routine blood screening. Followup with the ophthalmologist. Continue on present medication regimen and remained quite active. Recheck here in a month if the ear lesion has not cleared.

## 2010-12-27 NOTE — Patient Instructions (Signed)
Continue on your present medications. Return here as needed.

## 2010-12-28 ENCOUNTER — Telehealth: Payer: Self-pay

## 2010-12-28 NOTE — Telephone Encounter (Signed)
Left message that labs are good

## 2010-12-28 NOTE — Telephone Encounter (Signed)
Talked with wife told her I would mail a copy of his labs

## 2011-01-24 ENCOUNTER — Other Ambulatory Visit: Payer: Self-pay

## 2011-01-24 MED ORDER — OMEPRAZOLE 20 MG PO CPDR: 20.0000 mg | DELAYED_RELEASE_CAPSULE | Freq: Every day | ORAL | Status: DC

## 2011-02-03 ENCOUNTER — Ambulatory Visit (INDEPENDENT_AMBULATORY_CARE_PROVIDER_SITE_OTHER): Payer: Medicare Other | Admitting: Family Medicine

## 2011-02-03 ENCOUNTER — Encounter: Payer: Self-pay | Admitting: Family Medicine

## 2011-02-03 VITALS — BP 120/84 | HR 66 | Wt 157.0 lb

## 2011-02-03 DIAGNOSIS — S5011XA Contusion of right forearm, initial encounter: Secondary | ICD-10-CM

## 2011-02-03 DIAGNOSIS — M79609 Pain in unspecified limb: Secondary | ICD-10-CM

## 2011-02-03 DIAGNOSIS — S5010XA Contusion of unspecified forearm, initial encounter: Secondary | ICD-10-CM

## 2011-02-03 DIAGNOSIS — H6190 Disorder of external ear, unspecified, unspecified ear: Secondary | ICD-10-CM

## 2011-02-03 DIAGNOSIS — M79605 Pain in left leg: Secondary | ICD-10-CM

## 2011-02-03 NOTE — Patient Instructions (Signed)
Keep track of his symptoms concerning the leg and call me if this recurs.

## 2011-02-03 NOTE — Progress Notes (Signed)
  Subjective:    Patient ID: [name], male    DOB: 04-30-1932, 77 y.o.   MRN: [id number]  HPI He is here for a consult concerning multiple issues. The lesion on his left ear still present. He also noted difficulty with inability to use his left leg, however it was transient lasting several minutes. During that time he was able to hold onto a shopping cart and move around without difficulty. The symptoms went away entirely. He also notes an episode of right forearm spasm however after this occurred he did go play golf which did slightly interfere with his ability to play. After that he noted some swelling and ecchymosis in the forearm. No history of injury to the elbow or forearm.    Review of Systems      exam of the left ear does show asmall healing lesion present over the earlobe.    Physical Exam     Exam of the left earlobe does show a 0.5 cm lesion superior on the earlobe. It is slightly erythematous. Exam of the right forearm does show an ecchymosis medially as well as on the distal humerus. Full motion of the elbow. No history of recent injury. Left leg exam shows full motion of the hip. Normal sensation and motor.   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 The etiology of his other symptoms is unclear. I reassured him that I did not think he was in any danger with the left leg. It did not sound like TIA or CVA. He could possibly have injured his forearm without knowing it but at this time I would not pursue any further evaluation. Dermatology referral will be made.

## 2011-05-05 ENCOUNTER — Telehealth: Payer: Self-pay | Admitting: Family Medicine

## 2011-05-05 NOTE — Telephone Encounter (Signed)
Called and informed the dad about dr

## 2011-05-05 NOTE — Telephone Encounter (Signed)
Have her try calling some of the rheumatologists. If she has a primary care doctor that's the best place to start

## 2011-06-20 ENCOUNTER — Telehealth: Payer: Self-pay | Admitting: Family Medicine

## 2011-06-20 MED ORDER — PRAVASTATIN SODIUM 40 MG PO TABS: 40.0000 mg | ORAL_TABLET | Freq: Every day | ORAL | Status: DC

## 2011-06-20 NOTE — Telephone Encounter (Signed)
Refilled pt med for 90 day

## 2011-06-30 ENCOUNTER — Telehealth: Payer: Self-pay | Admitting: Family Medicine

## 2011-06-30 MED ORDER — PRAVASTATIN SODIUM 40 MG PO TABS: 40.0000 mg | ORAL_TABLET | Freq: Every day | ORAL | Status: DC

## 2011-06-30 NOTE — Telephone Encounter (Signed)
Let him know that I called in the prescription

## 2011-06-30 NOTE — Telephone Encounter (Signed)
He was given a 30 day supply to hold them until the 90 day prescription comes in.

## 2011-06-30 NOTE — Telephone Encounter (Signed)
PT INFORMED

## 2011-08-08 ENCOUNTER — Encounter (INDEPENDENT_AMBULATORY_CARE_PROVIDER_SITE_OTHER): Payer: Medicare Other | Admitting: Ophthalmology

## 2011-08-11 ENCOUNTER — Encounter (INDEPENDENT_AMBULATORY_CARE_PROVIDER_SITE_OTHER): Payer: Medicare Other | Admitting: Ophthalmology

## 2011-08-11 DIAGNOSIS — H16049 Marginal corneal ulcer, unspecified eye: Secondary | ICD-10-CM

## 2011-08-11 DIAGNOSIS — H43819 Vitreous degeneration, unspecified eye: Secondary | ICD-10-CM

## 2011-08-11 DIAGNOSIS — H33309 Unspecified retinal break, unspecified eye: Secondary | ICD-10-CM

## 2011-08-11 DIAGNOSIS — D3130 Benign neoplasm of unspecified choroid: Secondary | ICD-10-CM

## 2011-09-13 ENCOUNTER — Telehealth: Payer: Self-pay | Admitting: Family Medicine

## 2011-09-13 MED ORDER — PRAVASTATIN SODIUM 40 MG PO TABS: 40.0000 mg | ORAL_TABLET | Freq: Every day | ORAL | Status: DC

## 2011-09-13 NOTE — Telephone Encounter (Signed)
Med sent in.

## 2011-12-13 ENCOUNTER — Telehealth: Payer: Self-pay | Admitting: Family Medicine

## 2011-12-13 MED ORDER — PRAVASTATIN SODIUM 40 MG PO TABS: 40.0000 mg | ORAL_TABLET | Freq: Every day | ORAL | Status: DC

## 2011-12-13 NOTE — Telephone Encounter (Signed)
Pt req refill for Pravastatin 40 mg 1 qd   With 90 day supply to [facility]

## 2011-12-13 NOTE — Telephone Encounter (Signed)
Med sent in.

## 2011-12-13 NOTE — Telephone Encounter (Signed)
Sent med in pt will need office visit before anymore refills

## 2011-12-28 ENCOUNTER — Encounter: Payer: Self-pay | Admitting: Internal Medicine

## 2012-01-03 ENCOUNTER — Telehealth: Payer: Self-pay | Admitting: Family Medicine

## 2012-01-03 ENCOUNTER — Ambulatory Visit (INDEPENDENT_AMBULATORY_CARE_PROVIDER_SITE_OTHER): Payer: Medicare Other | Admitting: Family Medicine

## 2012-01-03 ENCOUNTER — Encounter: Payer: Self-pay | Admitting: Family Medicine

## 2012-01-03 VITALS — BP 122/80 | HR 58 | Ht 67.0 in | Wt 159.0 lb

## 2012-01-03 DIAGNOSIS — M1990 Unspecified osteoarthritis, unspecified site: Secondary | ICD-10-CM

## 2012-01-03 DIAGNOSIS — K219 Gastro-esophageal reflux disease without esophagitis: Secondary | ICD-10-CM

## 2012-01-03 DIAGNOSIS — Z79899 Other long term (current) drug therapy: Secondary | ICD-10-CM

## 2012-01-03 DIAGNOSIS — E785 Hyperlipidemia, unspecified: Secondary | ICD-10-CM

## 2012-01-03 DIAGNOSIS — J301 Allergic rhinitis due to pollen: Secondary | ICD-10-CM

## 2012-01-03 DIAGNOSIS — Z23 Encounter for immunization: Secondary | ICD-10-CM

## 2012-01-03 DIAGNOSIS — M129 Arthropathy, unspecified: Secondary | ICD-10-CM

## 2012-01-03 DIAGNOSIS — I10 Essential (primary) hypertension: Secondary | ICD-10-CM

## 2012-01-03 LAB — POCT URINALYSIS DIPSTICK
Bilirubin, UA: NEGATIVE
Blood, UA: NEGATIVE
Glucose, UA: NEGATIVE
Ketones, UA: NEGATIVE
Nitrite, UA: NEGATIVE
Spec Grav, UA: 1.025

## 2012-01-03 LAB — CBC WITH DIFFERENTIAL/PLATELET
Eosinophils Absolute: 0.1 10*3/uL (ref 0.0–0.7)
Eosinophils Relative: 1 % (ref 0–5)
HCT: 50.5 % (ref 39.0–52.0)
Hemoglobin: 16.6 g/dL (ref 13.0–17.0)
Lymphs Abs: 0.9 10*3/uL (ref 0.7–4.0)
MCH: 29.7 pg (ref 26.0–34.0)
MCHC: 32.9 g/dL (ref 30.0–36.0)
Monocytes Absolute: 0.6 10*3/uL (ref 0.1–1.0)
Monocytes Relative: 9 % (ref 3–12)
Neutro Abs: 5.7 10*3/uL (ref 1.7–7.7)
Neutrophils Relative %: 78 % — ABNORMAL HIGH (ref 43–77)
Platelets: 202 10*3/uL (ref 150–400)
RBC: 5.59 MIL/uL (ref 4.22–5.81)
WBC: 7.3 10*3/uL (ref 4.0–10.5)

## 2012-01-03 MED ORDER — PRAVASTATIN SODIUM 40 MG PO TABS: 40.0000 mg | ORAL_TABLET | Freq: Every day | ORAL | Status: DC

## 2012-01-03 MED ORDER — FLUTICASONE PROPIONATE 50 MCG/ACT NA SUSP: 2.0000 | Freq: Every day | NASAL | Status: DC

## 2012-01-03 MED ORDER — OMEPRAZOLE 20 MG PO CPDR: 20.0000 mg | DELAYED_RELEASE_CAPSULE | Freq: Every day | ORAL | Status: DC

## 2012-01-03 MED ORDER — AMLODIPINE BESYLATE 5 MG PO TABS: 5.0000 mg | ORAL_TABLET | Freq: Every day | ORAL | Status: DC

## 2012-01-03 NOTE — Progress Notes (Signed)
  Subjective:    Patient ID: [name], male    DOB: [birth year], 78 y.o.   MRN: [id number]  HPI He is here for an interval evaluation. He does have daily difficulty with rhinorrhea and sneezing but usually occurs in the morning. He also has arthritis and does use Aleve in low doses with good results. He continues on his blood pressure medication and is having no difficulty with this. His reflux is responding quite nicely to Prilosec. He continues on Pravachol and has had no difficulty with this. Keeps himself quite physically active. His social history was reviewed. He does play a lot of golf. His marriage is going well. He also has remote history of prostate cancer. He also has a history of SVT with ablation.   Review of Systems Negative except as above    Objective:   Physical Exam BP 122/80  Pulse 58  Ht 5\' 7"  (1.702 m)  Wt 159 lb (72.122 kg)  BMI 24.90 kg/m2  SpO2 97%  General Appearance:    Alert, cooperative, no distress, appears stated age  Head:    Normocephalic, without obvious abnormality, atraumatic  Eyes:    PERRL, conjunctiva/corneas clear, EOM's intact, fundi    benign  Ears:    Normal TM's and external ear canals  Nose:   Nares normal, mucosa normal, no drainage or sinus   tenderness  Throat:   Lips, mucosa, and tongue normal; teeth and gums normal  Neck:   Supple, no lymphadenopathy;  thyroid:  no   enlargement/tenderness/nodules; no carotid   bruit or JVD  Back:    Spine nontender, no curvature, ROM normal, no CVA     tenderness  Lungs:     Clear to auscultation bilaterally without wheezes, rales or     ronchi; respirations unlabored  Chest Wall:    No tenderness or deformity   Heart:    Regular rate and rhythm, S1 and S2 normal, no murmur, rub   or gallop  Breast Exam:    No chest wall tenderness, masses or gynecomastia  Abdomen:     Soft, non-tender, nondistended, normoactive bowel sounds,    no masses, no hepatosplenomegaly  Genitalia:   deferred     Rectal:   deferred  Extremities:   No clubbing, cyanosis or edema  Pulses:   2+ and symmetric all extremities  Skin:   Skin color, texture, turgor normal, no rashes or lesions  Lymph nodes:   Cervical, supraclavicular, and axillary nodes normal  Neurologic:   CNII-XII intact, normal strength, sensation and gait; reflexes 2+ and symmetric throughout          Psych:   Normal mood, affect, hygiene and grooming.           Assessment & Plan:   1. HYPERTENSION  CBC with Differential, Comprehensive metabolic panel, amLODipine (NORVASC) 5 MG tablet, POCT Urinalysis Dipstick  2. HYPERLIPIDEMIA  Lipid panel, pravastatin (PRAVACHOL) 40 MG tablet  3. Arthritis    4. Allergic rhinitis due to pollen    5. Encounter for long-term (current) use of other medications  CBC with Differential, Comprehensive metabolic panel, Lipid panel  6. GERD (gastroesophageal reflux disease)  omeprazole (PRILOSEC) 20 MG capsule  Continue with his very active lifestyle and continue on all present medications.

## 2012-01-03 NOTE — Telephone Encounter (Signed)
flonase sent in per pt request

## 2012-01-03 NOTE — Telephone Encounter (Signed)
Med sent in.

## 2012-01-03 NOTE — Telephone Encounter (Signed)
Sent med in 

## 2012-01-03 NOTE — Patient Instructions (Signed)
Keep taking good care of yourself 

## 2012-01-04 LAB — COMPREHENSIVE METABOLIC PANEL
ALT: 26 U/L (ref 0–53)
Albumin: 4.8 g/dL (ref 3.5–5.2)
CO2: 28 mEq/L (ref 19–32)
Glucose, Bld: 90 mg/dL (ref 70–99)
Potassium: 5 mEq/L (ref 3.5–5.3)
Sodium: 143 mEq/L (ref 135–145)
Total Bilirubin: 0.5 mg/dL (ref 0.3–1.2)
Total Protein: 7.5 g/dL (ref 6.0–8.3)

## 2012-01-04 LAB — LIPID PANEL
Cholesterol: 209 mg/dL — ABNORMAL HIGH (ref 0–200)
LDL Cholesterol: 132 mg/dL — ABNORMAL HIGH (ref 0–99)
Total CHOL/HDL Ratio: 3.8 Ratio
Triglycerides: 110 mg/dL (ref <150)
VLDL: 22 mg/dL (ref 0–40)

## 2012-03-20 ENCOUNTER — Telehealth: Payer: Self-pay | Admitting: Family Medicine

## 2012-03-20 MED ORDER — RANITIDINE HCL 300 MG PO TABS: 300.0000 mg | ORAL_TABLET | Freq: Every day | ORAL | Status: DC

## 2012-03-20 NOTE — Telephone Encounter (Signed)
Ranitidine renewed

## 2012-05-16 ENCOUNTER — Telehealth: Payer: Self-pay | Admitting: Family Medicine

## 2012-05-16 NOTE — Telephone Encounter (Signed)
Make sure he is on aspirin and have him set up a followup appointment with me

## 2012-05-17 ENCOUNTER — Ambulatory Visit (INDEPENDENT_AMBULATORY_CARE_PROVIDER_SITE_OTHER): Payer: Medicare Other | Admitting: Family Medicine

## 2012-05-17 ENCOUNTER — Encounter: Payer: Self-pay | Admitting: Family Medicine

## 2012-05-17 VITALS — BP 112/70 | HR 55 | Wt 160.0 lb

## 2012-05-17 DIAGNOSIS — Z8679 Personal history of other diseases of the circulatory system: Secondary | ICD-10-CM

## 2012-05-17 DIAGNOSIS — G459 Transient cerebral ischemic attack, unspecified: Secondary | ICD-10-CM

## 2012-05-17 NOTE — Progress Notes (Signed)
  Subjective:    Patient ID: [name], male    DOB: 08-Aug-1931, 78 y.o.   MRN: [id number]  HPI Two days ago he had the onset of blurred vision and dizziness on the right. He says it lasted approximately 2 minutes. He had no speech difficulty, double vision, weakness, heart rate changes. No headache after the blurred vision. He also has a history of approximately one year ago he had an episode of difficulty using his right leg that again lasted for several minutes. He does have a history of SVT bradycardia but notes no heart rate issues surrounding this. He continues on 81 mg aspirin per day.   Review of Systems     Objective:   Physical Exam alert and in no distress. EOMI. No carotid bruits noted. DTRs normal. Tympanic membranes and canals are normal. Throat is clear. Tonsils are normal. Neck is supple without adenopathy or thyromegaly. Cardiac exam shows a regular sinus rhythm without murmurs or gallops. Lungs are clear to auscultation.        Assessment & Plan:   1. SUPRAVENTRICULAR TACHYCARDIA, HX OF   2. TIA (transient ischemic attack)    suggested possibly increasing his aspirin to adult dosing. Also recommended that if he has symptoms again and last more than an hour, he should go directly to the emergency room

## 2012-08-09 ENCOUNTER — Telehealth: Payer: Self-pay | Admitting: Family Medicine

## 2012-08-09 DIAGNOSIS — K219 Gastro-esophageal reflux disease without esophagitis: Secondary | ICD-10-CM

## 2012-08-09 MED ORDER — OMEPRAZOLE 20 MG PO CPDR: 20.0000 mg | DELAYED_RELEASE_CAPSULE | Freq: Every day | ORAL | Status: DC

## 2012-08-09 NOTE — Telephone Encounter (Signed)
[facility] to [facility] per pt request

## 2012-08-13 ENCOUNTER — Ambulatory Visit (INDEPENDENT_AMBULATORY_CARE_PROVIDER_SITE_OTHER): Payer: Medicare Other | Admitting: Ophthalmology

## 2012-08-17 ENCOUNTER — Telehealth: Payer: Self-pay | Admitting: Internal Medicine

## 2012-08-17 MED ORDER — FLUTICASONE PROPIONATE 50 MCG/ACT NA SUSP: 2.0000 | Freq: Every day | NASAL | Status: DC

## 2012-08-17 NOTE — Telephone Encounter (Signed)
SENT IN [facility] FOR PT

## 2012-09-12 ENCOUNTER — Telehealth: Payer: Self-pay | Admitting: Family Medicine

## 2012-09-12 NOTE — Telephone Encounter (Signed)
Pt called and stated he needs refills on meds. I checked his appointments and he had cpe in June and states he usually just comes in once a year. Pt needs norvasc, zantac, pravastatin and prilosec. He has a NEW PHARMACY. He uses [facility] and the fax number is [phone].

## 2012-09-13 ENCOUNTER — Other Ambulatory Visit: Payer: Self-pay

## 2012-09-13 DIAGNOSIS — K219 Gastro-esophageal reflux disease without esophagitis: Secondary | ICD-10-CM

## 2012-09-13 DIAGNOSIS — I10 Essential (primary) hypertension: Secondary | ICD-10-CM

## 2012-09-13 DIAGNOSIS — E785 Hyperlipidemia, unspecified: Secondary | ICD-10-CM

## 2012-09-13 MED ORDER — AMLODIPINE BESYLATE 5 MG PO TABS: 5.0000 mg | ORAL_TABLET | Freq: Every day | ORAL | Status: DC

## 2012-09-13 MED ORDER — PRAVASTATIN SODIUM 40 MG PO TABS: 40.0000 mg | ORAL_TABLET | Freq: Every day | ORAL | Status: DC

## 2012-09-13 MED ORDER — OMEPRAZOLE 20 MG PO CPDR: 20.0000 mg | DELAYED_RELEASE_CAPSULE | Freq: Every day | ORAL | Status: DC

## 2012-09-13 MED ORDER — RANITIDINE HCL 300 MG PO TABS: 300.0000 mg | ORAL_TABLET | Freq: Every day | ORAL | Status: DC

## 2012-09-13 NOTE — Telephone Encounter (Signed)
SENT MEDS IN  

## 2012-09-21 NOTE — Telephone Encounter (Signed)
done

## 2012-09-26 ENCOUNTER — Telehealth: Payer: Self-pay | Admitting: Family Medicine

## 2012-09-26 MED ORDER — DESONIDE 0.05 % EX CREA: TOPICAL_CREAM | Freq: Two times a day (BID) | CUTANEOUS | Status: DC

## 2012-09-26 NOTE — Telephone Encounter (Signed)
Pt called and stated that he needed a refill on a medication that was originally prescribed by Dr. Terri Piedra. He stated that you have told him in the past the you would take care of all his medications regardless of original pre scriber. The medication is Desonide .05 cream. [facility] uses [facility] on [address].

## 2013-01-15 ENCOUNTER — Ambulatory Visit (INDEPENDENT_AMBULATORY_CARE_PROVIDER_SITE_OTHER): Payer: MEDICARE | Admitting: Family Medicine

## 2013-01-15 ENCOUNTER — Encounter: Payer: Self-pay | Admitting: Family Medicine

## 2013-01-15 VITALS — BP 114/70 | HR 63 | Ht 67.0 in | Wt 163.0 lb

## 2013-01-15 DIAGNOSIS — Z8679 Personal history of other diseases of the circulatory system: Secondary | ICD-10-CM

## 2013-01-15 DIAGNOSIS — R05 Cough: Secondary | ICD-10-CM

## 2013-01-15 DIAGNOSIS — M129 Arthropathy, unspecified: Secondary | ICD-10-CM

## 2013-01-15 DIAGNOSIS — E785 Hyperlipidemia, unspecified: Secondary | ICD-10-CM

## 2013-01-15 DIAGNOSIS — J309 Allergic rhinitis, unspecified: Secondary | ICD-10-CM

## 2013-01-15 DIAGNOSIS — M1990 Unspecified osteoarthritis, unspecified site: Secondary | ICD-10-CM

## 2013-01-15 DIAGNOSIS — K219 Gastro-esophageal reflux disease without esophagitis: Secondary | ICD-10-CM

## 2013-01-15 DIAGNOSIS — Z79899 Other long term (current) drug therapy: Secondary | ICD-10-CM

## 2013-01-15 DIAGNOSIS — Z0000 Encounter for general adult medical examination without abnormal findings: Secondary | ICD-10-CM

## 2013-01-15 DIAGNOSIS — I10 Essential (primary) hypertension: Secondary | ICD-10-CM

## 2013-01-15 DIAGNOSIS — R059 Cough, unspecified: Secondary | ICD-10-CM

## 2013-01-15 LAB — CBC WITH DIFFERENTIAL/PLATELET
Eosinophils Absolute: 0.1 10*3/uL (ref 0.0–0.7)
Eosinophils Relative: 1 % (ref 0–5)
HCT: 45.6 % (ref 39.0–52.0)
Lymphocytes Relative: 14 % (ref 12–46)
MCHC: 34.9 g/dL (ref 30.0–36.0)
MCV: 86 fL (ref 78.0–100.0)
Monocytes Absolute: 0.6 10*3/uL (ref 0.1–1.0)
Platelets: 182 10*3/uL (ref 150–400)
RDW: 13.8 % (ref 11.5–15.5)
WBC: 6.3 10*3/uL (ref 4.0–10.5)

## 2013-01-15 LAB — COMPREHENSIVE METABOLIC PANEL
Albumin: 4.1 g/dL (ref 3.5–5.2)
CO2: 25 mEq/L (ref 19–32)
Chloride: 106 mEq/L (ref 96–112)
Glucose, Bld: 88 mg/dL (ref 70–99)
Potassium: 4.6 mEq/L (ref 3.5–5.3)
Sodium: 137 mEq/L (ref 135–145)
Total Protein: 7 g/dL (ref 6.0–8.3)

## 2013-01-15 LAB — LIPID PANEL
Cholesterol: 197 mg/dL (ref 0–200)
Triglycerides: 120 mg/dL (ref <150)

## 2013-01-15 MED ORDER — AMLODIPINE BESYLATE 5 MG PO TABS: 5.0000 mg | ORAL_TABLET | Freq: Every day | ORAL | Status: DC

## 2013-01-15 MED ORDER — PRAVASTATIN SODIUM 40 MG PO TABS: 40.0000 mg | ORAL_TABLET | Freq: Every day | ORAL | Status: DC

## 2013-01-15 MED ORDER — OMEPRAZOLE 20 MG PO CPDR: 20.0000 mg | DELAYED_RELEASE_CAPSULE | Freq: Every day | ORAL | Status: DC

## 2013-01-15 MED ORDER — FLUTICASONE PROPIONATE 50 MCG/ACT NA SUSP: 2.0000 | Freq: Every day | NASAL | Status: DC

## 2013-01-15 NOTE — Progress Notes (Signed)
  Subjective:    Patient ID: [name], male    DOB: 03/30/1932, 79 y.o.   MRN: [id number]  HPI He is here for a medication check and examination. He has underlying allergies and does use Flonase on an as-needed basis. He keeps quite physically active playing golf and does have some difficulty with arthritis however it doesn't seem to interfere too much with his lifestyle. He has a previous history of cardiac disease but has had no difficulty with weakness, shortness of breath, DOE. He has noted no irregular heart rates. His reflux is under good control with Prilosec. He continues on Pravachol as well as Norvasc. He has been married for 50 years. 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   Review of Systems Negative except as above    Objective:   Physical Exam alert and in no distress. Tympanic membranes and canals are normal. Throat is clear. Tonsils are normal. Neck is supple without adenopathy or thyromegaly. Cardiac exam shows an irregular rhythm was questionable S4 gallop Lungs are clear to auscultation. abdominal exam shows no masses or tenderness with normal bowel sounds EKG shows a bradycardia with a junctional rhythm.       Assessment & Plan:  Routine general medical examination at a health care facility  HYPERLIPIDEMIA - Plan: pravastatin (PRAVACHOL) 40 MG tablet, Lipid panel  HYPERTENSION - Plan: amLODipine (NORVASC) 5 MG tablet, CBC with Differential, Comprehensive metabolic panel  ALLERGIC RHINITIS - Plan: fluticasone (FLONASE) 50 MCG/ACT nasal spray  SUPRAVENTRICULAR TACHYCARDIA, HX OF - Plan: EKG 12-Lead  Cough  Arthritis  GERD (gastroesophageal reflux disease) - Plan: omeprazole (PRILOSEC) 20 MG capsule  Encounter for long-term (current) use of other medications - Plan: CBC with Differential, Comprehensive metabolic panel, Lipid panel He has been seen in the past by Dr. Ladona Ridgel. I will have Dr. Ladona Ridgel evaluate EKG and make appropriate  arrangements after that.

## 2013-01-16 NOTE — Progress Notes (Signed)
Quick Note:  The blood work is normal ______ 

## 2013-01-17 NOTE — Progress Notes (Signed)
Quick Note:  MAILED LETTER ______

## 2013-02-04 ENCOUNTER — Ambulatory Visit (INDEPENDENT_AMBULATORY_CARE_PROVIDER_SITE_OTHER): Payer: MEDICARE | Admitting: Ophthalmology

## 2013-02-04 DIAGNOSIS — H43819 Vitreous degeneration, unspecified eye: Secondary | ICD-10-CM

## 2013-02-04 DIAGNOSIS — I10 Essential (primary) hypertension: Secondary | ICD-10-CM

## 2013-02-04 DIAGNOSIS — D3130 Benign neoplasm of unspecified choroid: Secondary | ICD-10-CM

## 2013-02-04 DIAGNOSIS — H35039 Hypertensive retinopathy, unspecified eye: Secondary | ICD-10-CM

## 2013-02-05 ENCOUNTER — Telehealth: Payer: Self-pay | Admitting: Internal Medicine

## 2013-02-05 NOTE — Telephone Encounter (Signed)
Pt states he was here not to long ago for a physical and you expressed some concern in his EKG and he was never told the results of it. So he would like to know what the results to his EKG was

## 2013-02-05 NOTE — Telephone Encounter (Signed)
This is for you!

## 2013-02-05 NOTE — Telephone Encounter (Signed)
Pt informed word for word pt verbalized understanding

## 2013-02-05 NOTE — Telephone Encounter (Signed)
Tell him that I put a call into his cardiologist concerning the EKG in am waiting to hear back

## 2013-02-07 ENCOUNTER — Telehealth: Payer: Self-pay

## 2013-02-07 MED ORDER — LORATADINE 10 MG PO TABS: 10.0000 mg | ORAL_TABLET | Freq: Every day | ORAL | Status: DC

## 2013-02-07 NOTE — Telephone Encounter (Signed)
REQUESTING TO BE CHANGED FROM ZYRTEC 10MG  TO LORATADINE 10MG  (GENERIC CLARATIN) FOR A WHILE TO SEE HOW HIS ALLERGIES DOES ON THIS MED. WILL NEED A SCRIPT FOR LORATADINE 10MG  #90 SENT TO [facility]  TOGET THIS AS $4 MED  THIS WAS 2ND PART TO PT CALL THE OTHER DAY IS THIS OK PLEASE ADVISE

## 2013-03-18 ENCOUNTER — Telehealth: Payer: Self-pay | Admitting: Family Medicine

## 2013-03-18 NOTE — Telephone Encounter (Signed)
pls call Dr. Taylor/cardiologist office about this.  Dr. Jola Babinski notes state that he contacted cardiology about this.  I don't see any msg or reply back from Dr. Ladona Ridgel.  Please call cardiology to make sure they got the EKG and what have nurse/Dr. Ladona Ridgel speak to me with their thoughts.   Please pull/print the EKG for me.

## 2013-03-19 ENCOUNTER — Telehealth: Payer: Self-pay | Admitting: Internal Medicine

## 2013-03-19 DIAGNOSIS — I10 Essential (primary) hypertension: Secondary | ICD-10-CM

## 2013-03-19 DIAGNOSIS — E785 Hyperlipidemia, unspecified: Secondary | ICD-10-CM

## 2013-03-19 MED ORDER — AMLODIPINE BESYLATE 5 MG PO TABS: 5.0000 mg | ORAL_TABLET | Freq: Every day | ORAL | Status: DC

## 2013-03-19 MED ORDER — PRAVASTATIN SODIUM 40 MG PO TABS: 40.0000 mg | ORAL_TABLET | Freq: Every day | ORAL | Status: DC

## 2013-03-19 NOTE — Telephone Encounter (Signed)
I left a message for the nurse to callback to speak with you. CLS

## 2013-03-19 NOTE — Telephone Encounter (Signed)
New Prob  [name] from [facility] would like to speak with a nurse regarding an EKG for this pt.

## 2013-03-19 NOTE — Telephone Encounter (Signed)
Pt needed a refill on pravastatin 40mg  and amlodipine 5mg  to [facility]

## 2013-03-19 NOTE — Telephone Encounter (Signed)
Ernst Breach, PA, for Dr. Susann Givens at [facility] [phone]) called to follow up with request to Dr. Ladona Ridgel regarding abnormal exam findings and abnormal EKG from June, 2014. This request is not found in the medical record. They are now calling to request if Dr. Ladona Ridgel can please review EKG and offer advisement for this patient. Patient's exam finding included S4 gallup heard upon ascultation. Patient has history of SVT, HTN and hyperlipidemia. EKG dated: 01/15/2013. Thank you!

## 2013-03-19 NOTE — Telephone Encounter (Signed)
i cannot find ECG in epic. GT

## 2013-03-20 NOTE — Telephone Encounter (Signed)
I am out of office Thursday, but if cardiology calls back, have Dr. Ladona Ridgel call my cell [phone]

## 2013-03-27 NOTE — Telephone Encounter (Signed)
Did you ever get anything back from Dr. Ladona Ridgel.  I awaited his call back last week after speaking to his nurse, but never heard back.

## 2013-04-03 NOTE — Telephone Encounter (Signed)
ECG scanned copy located in EPIC, printed and placed in Dr. Lubertha Basque box for review.

## 2013-04-12 ENCOUNTER — Telehealth: Payer: Self-pay | Admitting: Internal Medicine

## 2013-04-12 ENCOUNTER — Encounter: Payer: Self-pay | Admitting: Family Medicine

## 2013-04-12 NOTE — Telephone Encounter (Signed)
[name], This would be a good patient to see on Tuesday in my clinic.

## 2013-04-12 NOTE — Telephone Encounter (Addendum)
Copy of ekg/ and ov note from Dr Susann Givens taken to DOD Dr Tenny Craw, pt needs to be seen in two weeks by EP. Pt informed and will agree to be seen. Will take to EP scheduler. Pt was told to call if he experiences dizziness or cp, pt verbalized understanding.

## 2013-04-12 NOTE — Progress Notes (Signed)
  Subjective:    Patient ID: [name], male    DOB: 10/23/1931, 79 y.o.   MRN: [id number]  HPI    Review of Systems     Objective:   Physical Exam        Assessment & Plan:  His EKG was reviewed and did show sinus bradycardia with complete heart block. I relayed this information to him and recommended that he set up an appoint with Dr. Ladona Ridgel which he will do.

## 2013-04-12 NOTE — Telephone Encounter (Signed)
Patient want to discuss his heart problem.  He said that Dr. Ladona Ridgel told Dr. Susann Givens that he had a blockage.  Patient said that this is the first he has heard of this.

## 2013-04-15 ENCOUNTER — Encounter: Payer: Self-pay | Admitting: Internal Medicine

## 2013-04-15 ENCOUNTER — Ambulatory Visit (INDEPENDENT_AMBULATORY_CARE_PROVIDER_SITE_OTHER): Payer: Medicare Other | Admitting: Internal Medicine

## 2013-04-15 VITALS — BP 156/73 | HR 52 | Ht 67.5 in | Wt 160.0 lb

## 2013-04-15 DIAGNOSIS — I499 Cardiac arrhythmia, unspecified: Secondary | ICD-10-CM

## 2013-04-15 NOTE — Assessment & Plan Note (Signed)
The patient has high-grade conduction disease which is minimally if at all symptomatic. I've recommended that he undergo exercise treadmill testing to better assess chronotropic competence. This be scheduled next few weeks. If he were to have syncope or vision changes, he is instructed to return to the emergency room or call our office.

## 2013-04-15 NOTE — Assessment & Plan Note (Signed)
He has had no recurrent symptoms.

## 2013-04-15 NOTE — Progress Notes (Signed)
HPI [name] returns today after a long absence from our arrhythmia clinic.he is a very pleasant 79 year old man with a history of tachycardia palpitations and documented SVT. We do not have details of his prior ablations but these occurred over 5 years ago. He had recurrent SVT after his initial ablation and underwent a successful second ablation. Following each ablation, he had marked first degree AV block. He was undergoing a routine physical in his primary care physician's office several months ago when he was found to have sinus rhythm with complete heart block. Since then, the patient has felt well. He denies syncope or near-syncope. No visual changes. His activity is good. During the summer, he will walk 18 holes of golf. No peripheral edema. Allergies  Allergen Reactions  . Codeine      Current Outpatient Prescriptions  Medication Sig Dispense Refill  . amLODipine (NORVASC) 5 MG tablet Take 1 tablet (5 mg total) by mouth daily.  90 tablet  3  . aspirin 81 MG tablet Take 81 mg by mouth 2 (two) times daily.       Marland Kitchen desonide (DESOWEN) 0.05 % cream Apply topically 2 (two) times daily.  30 g  11  . fish oil-omega-3 fatty acids 1000 MG capsule Take 1,200 mg by mouth daily.      . fluticasone (FLONASE) 50 MCG/ACT nasal spray Place 2 sprays into the nose daily.  48 g  3  . glucosamine-chondroitin 500-400 MG tablet Take 1 tablet by mouth 3 (three) times daily.        . Multiple Vitamins-Minerals (MULTIVITAMIN WITH MINERALS) tablet Take 1 tablet by mouth daily.        Marland Kitchen omeprazole (PRILOSEC) 20 MG capsule Take 1 capsule (20 mg total) by mouth daily.  90 capsule  3  . pravastatin (PRAVACHOL) 40 MG tablet Take 1 tablet (40 mg total) by mouth daily.  90 tablet  3   No current facility-administered medications for this visit.     Past Medical History  Diagnosis Date  . Hypercholesterolemia   . Allergy   . Prostate cancer 1997  . HH (hiatus hernia)   . History of colonic polyps   . Eczema    . Arthritis   . Hypertension   . Cataracts, bilateral     ROS:   All systems reviewed and negative except as noted in the HPI.   Past Surgical History  Procedure Laterality Date  . Prostate surgery    . Avnode reentrant ablation    . Colonoscopy  2007    [facility]  . Testicle surgery  2010    [name]     No family history on file.   History   Social History  . Marital Status: Married    Spouse Name: N/A    Number of Children: N/A  . Years of Education: N/A   Occupational History  . Not on file.   Social History Main Topics  . Smoking status: Never Smoker   . Smokeless tobacco: Never Used  . Alcohol Use: No  . Drug Use: No  . Sexual Activity: Yes   Other Topics Concern  . Not on file   Social History Narrative  . No narrative on file     BP 156/73  Pulse 52  Ht 5' 7.5" (1.715 m)  Wt 160 lb (72.576 kg)  BMI 24.68 kg/m2  Physical Exam:  Well appearing 79 year old man, NAD HEENT: Unremarkable Neck:  6 cm JVD, no thyromegally Back:  No  CVA tenderness Lungs:  Clear with no wheezes, rales, or rhonchi. HEART:  Regular bradycardia rhythm, no murmurs, no rubs, no clicks Abd:  soft, positive bowel sounds, no organomegally, no rebound, no guarding Ext:  2 plus pulses, no edema, no cyanosis, no clubbing Skin:  No rashes no nodules Neuro:  CN II through XII intact, motor grossly intact  EKG - normal sinus rhythm with sinus bradycardia and first degree AV block with a PR interval of 600 ms.   Assess/Plan:

## 2013-04-15 NOTE — Patient Instructions (Addendum)
Your physician has requested that you have an exercise tolerance test. For further information please visit [email or web address] Please also follow instruction sheet, as given. With Dr. Ladona Ridgel  Your physician recommends that you continue on your current medications as directed. Please refer to the Current Medication list given to you today.  Your physician wants you to follow-up in: 6 months with Dr. Ladona Ridgel. You will receive a reminder letter in the mail two months in advance. If you don't receive a letter, please call our office to schedule the follow-up appointment.

## 2013-05-28 ENCOUNTER — Ambulatory Visit (INDEPENDENT_AMBULATORY_CARE_PROVIDER_SITE_OTHER): Payer: Medicare Other | Admitting: Internal Medicine

## 2013-05-28 DIAGNOSIS — I499 Cardiac arrhythmia, unspecified: Secondary | ICD-10-CM

## 2013-05-28 DIAGNOSIS — R001 Bradycardia, unspecified: Secondary | ICD-10-CM

## 2013-05-28 DIAGNOSIS — I498 Other specified cardiac arrhythmias: Secondary | ICD-10-CM

## 2013-05-28 DIAGNOSIS — I440 Atrioventricular block, first degree: Secondary | ICD-10-CM

## 2013-05-28 NOTE — Progress Notes (Signed)
Exercise Treadmill Test  Pre-Exercise Testing Evaluation Rhythm: sinus bradycardia  Rate: 57     Test  Exercise Tolerance Test Ordering MD: Lewayne Bunting, MD  Interpreting MD: Lewayne Bunting, MD  Unique Test No: 1  Treadmill:  1  Indication for ETT: Arrhythmia, 1st degree AV-block, Bradycardia  Contraindication to ETT: No   Stress Modality: exercise - treadmill  Cardiac Imaging Performed: non   Protocol: standard Bruce - maximal  Max BP:  198/87  Max MPHR (bpm):  141 85% MPR (bpm):  120  MPHR obtained (bpm):  110 % MPHR obtained:  78  Reached 85% MPHR (min:sec):  n/a Total Exercise Time (min-sec):  6:00  Workload in METS:  7 Borg Scale: 13  Reason ETT Terminated:  desired heart rate attained    ST Segment Analysis At Rest: normal ST segments - no evidence of significant ST depression With Exercise: no evidence of significant ST depression  Other Information Arrhythmia:  No Angina during ETT:  absent (0) Quality of ETT:  diagnostic  ETT Interpretation:  normal - no evidence of ischemia by ST analysis  Comments: Mild chronotropic incompetence but no high grade heart block  Recommendations: Watchful waiting

## 2013-05-30 ENCOUNTER — Other Ambulatory Visit: Payer: Self-pay

## 2013-07-08 ENCOUNTER — Telehealth: Payer: Self-pay

## 2013-07-08 NOTE — Telephone Encounter (Signed)
CALLED PT TO SEE IF HE HAD HAD HIS FLU SHOT PT VERBALIZED HE DOESN'T GET THEM B

## 2013-09-16 ENCOUNTER — Telehealth: Payer: Self-pay | Admitting: Family Medicine

## 2013-09-16 DIAGNOSIS — K219 Gastro-esophageal reflux disease without esophagitis: Secondary | ICD-10-CM

## 2013-09-16 DIAGNOSIS — E785 Hyperlipidemia, unspecified: Secondary | ICD-10-CM

## 2013-09-16 DIAGNOSIS — I10 Essential (primary) hypertension: Secondary | ICD-10-CM

## 2013-09-16 MED ORDER — AMLODIPINE BESYLATE 5 MG PO TABS: 5.0000 mg | ORAL_TABLET | Freq: Every day | ORAL | Status: DC

## 2013-09-16 MED ORDER — OMEPRAZOLE 20 MG PO CPDR: 20.0000 mg | DELAYED_RELEASE_CAPSULE | Freq: Every day | ORAL | Status: DC

## 2013-09-16 MED ORDER — PRAVASTATIN SODIUM 40 MG PO TABS: 40.0000 mg | ORAL_TABLET | Freq: Every day | ORAL | Status: DC

## 2013-09-16 NOTE — Telephone Encounter (Signed)
Pt called for refills to his [place] [facility] [phone] for Prevastatin, Amlodipine and Omeprazole.

## 2013-09-17 ENCOUNTER — Telehealth: Payer: Self-pay | Admitting: Internal Medicine

## 2013-09-17 DIAGNOSIS — I10 Essential (primary) hypertension: Secondary | ICD-10-CM

## 2013-09-17 DIAGNOSIS — K219 Gastro-esophageal reflux disease without esophagitis: Secondary | ICD-10-CM

## 2013-09-17 DIAGNOSIS — E785 Hyperlipidemia, unspecified: Secondary | ICD-10-CM

## 2013-09-17 MED ORDER — AMLODIPINE BESYLATE 5 MG PO TABS: 5.0000 mg | ORAL_TABLET | Freq: Every day | ORAL | Status: DC

## 2013-09-17 MED ORDER — OMEPRAZOLE 20 MG PO CPDR: 20.0000 mg | DELAYED_RELEASE_CAPSULE | Freq: Every day | ORAL | Status: DC

## 2013-09-17 MED ORDER — PRAVASTATIN SODIUM 40 MG PO TABS: 40.0000 mg | ORAL_TABLET | Freq: Every day | ORAL | Status: DC

## 2013-09-17 NOTE — Telephone Encounter (Signed)
Called [facility] to cancel his meds that were sent. Pt is not active there so meds wont be sent out. i have sent meds to [facility] instead

## 2013-09-23 ENCOUNTER — Telehealth: Payer: Self-pay | Admitting: Family Medicine

## 2013-09-23 DIAGNOSIS — I10 Essential (primary) hypertension: Secondary | ICD-10-CM

## 2013-09-23 DIAGNOSIS — E785 Hyperlipidemia, unspecified: Secondary | ICD-10-CM

## 2013-09-23 DIAGNOSIS — K219 Gastro-esophageal reflux disease without esophagitis: Secondary | ICD-10-CM

## 2013-09-23 MED ORDER — OMEPRAZOLE 20 MG PO CPDR: 20.0000 mg | DELAYED_RELEASE_CAPSULE | Freq: Every day | ORAL | Status: DC

## 2013-09-23 MED ORDER — PRAVASTATIN SODIUM 40 MG PO TABS: 40.0000 mg | ORAL_TABLET | Freq: Every day | ORAL | Status: DC

## 2013-09-23 MED ORDER — AMLODIPINE BESYLATE 5 MG PO TABS: 5.0000 mg | ORAL_TABLET | Freq: Every day | ORAL | Status: DC

## 2013-09-23 NOTE — Telephone Encounter (Signed)
Medication sent in, pt scheduled physical 01/20/14

## 2013-09-23 NOTE — Telephone Encounter (Signed)
Please call patient, he wants to know why there were no refills on his recent Rx's that were sent in

## 2013-09-23 NOTE — Telephone Encounter (Signed)
He was last seen by me in June and will need an appointment before any more refills. He should have enough for a full year

## 2013-09-23 NOTE — Telephone Encounter (Signed)
Left message for pt TCB regarding medications

## 2013-09-30 ENCOUNTER — Telehealth: Payer: Self-pay | Admitting: Family Medicine

## 2013-09-30 MED ORDER — RANITIDINE HCL 300 MG PO TABS: 300.0000 mg | ORAL_TABLET | Freq: Every day | ORAL | Status: DC

## 2013-09-30 NOTE — Telephone Encounter (Signed)
Pt needs refill on Ranitidine 300 mg sent into [facility].

## 2013-12-10 ENCOUNTER — Telehealth: Payer: Self-pay | Admitting: Family Medicine

## 2013-12-10 NOTE — Telephone Encounter (Signed)
Pt needs refill on desonide sent to [facility]. This is a new pharm for this medication.

## 2013-12-12 MED ORDER — DESONIDE 0.05 % EX CREA: TOPICAL_CREAM | Freq: Two times a day (BID) | CUTANEOUS | Status: DC

## 2013-12-12 NOTE — Telephone Encounter (Signed)
Medication sent in. 

## 2013-12-23 ENCOUNTER — Encounter: Payer: Self-pay | Admitting: Family Medicine

## 2013-12-23 ENCOUNTER — Ambulatory Visit (INDEPENDENT_AMBULATORY_CARE_PROVIDER_SITE_OTHER): Payer: Medicare HMO | Admitting: Family Medicine

## 2013-12-23 VITALS — Wt 156.0 lb

## 2013-12-23 DIAGNOSIS — L723 Sebaceous cyst: Secondary | ICD-10-CM

## 2013-12-23 NOTE — Progress Notes (Signed)
   Subjective:    Patient ID: [name], male    DOB: 07/31/1931, 80 y.o.   MRN: [id number]  HPI He is here for evaluation of 2 lesions present on the bridge of the nose on the left. They're both round and he is concerned over the possibility of them being cancer.  Review of Systems     Objective:   Physical Exam 2 round cystic type lesions are noted on the bridge of the nose on the left. Both of them are approximately 3 mm in size.      Assessment & Plan:  Sebaceous cyst  both of the lesions were unroofed with an 11 blade and the contents were expressed without difficulty. He was a yellowish type of a material. I explained that both of these are sebaceous cysts and nothing to worry about.

## 2014-01-20 ENCOUNTER — Encounter: Payer: Self-pay | Admitting: Family Medicine

## 2014-01-20 ENCOUNTER — Ambulatory Visit (INDEPENDENT_AMBULATORY_CARE_PROVIDER_SITE_OTHER): Payer: Medicare HMO | Admitting: Family Medicine

## 2014-01-20 VITALS — BP 126/82 | HR 52 | Ht 67.0 in | Wt 158.0 lb

## 2014-01-20 DIAGNOSIS — N529 Male erectile dysfunction, unspecified: Secondary | ICD-10-CM

## 2014-01-20 DIAGNOSIS — Z23 Encounter for immunization: Secondary | ICD-10-CM

## 2014-01-20 DIAGNOSIS — Z8679 Personal history of other diseases of the circulatory system: Secondary | ICD-10-CM

## 2014-01-20 DIAGNOSIS — I498 Other specified cardiac arrhythmias: Secondary | ICD-10-CM

## 2014-01-20 DIAGNOSIS — Z8546 Personal history of malignant neoplasm of prostate: Secondary | ICD-10-CM

## 2014-01-20 DIAGNOSIS — Z0000 Encounter for general adult medical examination without abnormal findings: Secondary | ICD-10-CM

## 2014-01-20 DIAGNOSIS — J309 Allergic rhinitis, unspecified: Secondary | ICD-10-CM

## 2014-01-20 DIAGNOSIS — E785 Hyperlipidemia, unspecified: Secondary | ICD-10-CM

## 2014-01-20 DIAGNOSIS — I10 Essential (primary) hypertension: Secondary | ICD-10-CM

## 2014-01-20 DIAGNOSIS — N5231 Erectile dysfunction following radical prostatectomy: Secondary | ICD-10-CM

## 2014-01-20 LAB — CBC WITH DIFFERENTIAL/PLATELET
Basophils Absolute: 0 10*3/uL (ref 0.0–0.1)
Basophils Relative: 0 % (ref 0–1)
EOS ABS: 0.2 10*3/uL (ref 0.0–0.7)
Eosinophils Relative: 2 % (ref 0–5)
HCT: 49.6 % (ref 39.0–52.0)
Hemoglobin: 16.9 g/dL (ref 13.0–17.0)
Lymphocytes Relative: 11 % — ABNORMAL LOW (ref 12–46)
Lymphs Abs: 0.8 10*3/uL (ref 0.7–4.0)
MCH: 30.1 pg (ref 26.0–34.0)
MCHC: 34.1 g/dL (ref 30.0–36.0)
MCV: 88.3 fL (ref 78.0–100.0)
MONO ABS: 0.7 10*3/uL (ref 0.1–1.0)
Monocytes Relative: 9 % (ref 3–12)
Neutro Abs: 5.9 10*3/uL (ref 1.7–7.7)
Neutrophils Relative %: 78 % — ABNORMAL HIGH (ref 43–77)
PLATELETS: 175 10*3/uL (ref 150–400)
RBC: 5.62 MIL/uL (ref 4.22–5.81)
RDW: 14 % (ref 11.5–15.5)
WBC: 7.5 10*3/uL (ref 4.0–10.5)

## 2014-01-20 LAB — COMPREHENSIVE METABOLIC PANEL
ALT: 33 U/L (ref 0–53)
AST: 32 U/L (ref 0–37)
Albumin: 4.7 g/dL (ref 3.5–5.2)
Alkaline Phosphatase: 106 U/L (ref 39–117)
BILIRUBIN TOTAL: 0.9 mg/dL (ref 0.2–1.2)
BUN: 15 mg/dL (ref 6–23)
CO2: 27 meq/L (ref 19–32)
CREATININE: 0.92 mg/dL (ref 0.50–1.35)
Calcium: 9.7 mg/dL (ref 8.4–10.5)
Chloride: 101 mEq/L (ref 96–112)
GLUCOSE: 89 mg/dL (ref 70–99)
Potassium: 4.4 mEq/L (ref 3.5–5.3)
Sodium: 140 mEq/L (ref 135–145)
Total Protein: 7.4 g/dL (ref 6.0–8.3)

## 2014-01-20 LAB — POCT URINALYSIS DIPSTICK
BILIRUBIN UA: NEGATIVE
Glucose, UA: NEGATIVE
Ketones, UA: NEGATIVE
Leukocytes, UA: NEGATIVE
NITRITE UA: NEGATIVE
PH UA: 7.5
Protein, UA: NEGATIVE
RBC UA: NEGATIVE
SPEC GRAV UA: 1.025
Urobilinogen, UA: NEGATIVE

## 2014-01-20 LAB — LIPID PANEL
CHOL/HDL RATIO: 3.5 ratio
CHOLESTEROL: 198 mg/dL (ref 0–200)
HDL: 56 mg/dL (ref >39)
LDL Cholesterol: 114 mg/dL — ABNORMAL HIGH (ref 0–99)
TRIGLYCERIDES: 142 mg/dL (ref <150)
VLDL: 28 mg/dL (ref 0–40)

## 2014-01-20 MED ORDER — PRAVASTATIN SODIUM 40 MG PO TABS: 40.0000 mg | ORAL_TABLET | Freq: Every day | ORAL | Status: DC

## 2014-01-20 MED ORDER — AMLODIPINE BESYLATE 5 MG PO TABS: 5.0000 mg | ORAL_TABLET | Freq: Every day | ORAL | Status: DC

## 2014-01-20 NOTE — Progress Notes (Signed)
Subjective:    Patient ID: [name], male    DOB: 1931-11-08, 80 y.o.   MRN: [id number]  HPI He is here for complete examination. He has no particular concerns or complaints. He continues on his blood pressure medication as well as pravastatin and is having no difficulty with that. His allergies are under good control. He has seen his cardiologist. That record was reviewed. He does have a previous history that was most consistent with a TIA. He has been on aspirin since then and has not had anymore symptoms. He did have a radical prostatectomy and since then has had difficulty with erectile dysfunction. He has tried various medications and was given a pump to try. He does have a form that he would like me to fill out so he can get a different one. He has no other concerns or complaints. Her main social history were reviewed. He does keep himself in good physical condition.   Review of Systems  All other systems reviewed and are negative.      Objective:   Physical Exam BP 126/82  Pulse 52  Ht 5\' 7"  (1.702 m)  Wt 158 lb (71.668 kg)  BMI 24.74 kg/m2  General Appearance:    Alert, cooperative, no distress, appears stated age  Head:    Normocephalic, without obvious abnormality, atraumatic  Eyes:    PERRL, conjunctiva/corneas clear, EOM's intact, fundi    benign  Ears:    Normal TM's and external ear canals  Nose:   Nares normal, mucosa normal, no drainage or sinus   tenderness  Throat:   Lips, mucosa, and tongue normal; teeth and gums normal  Neck:   Supple, no lymphadenopathy;  thyroid:  no   enlargement/tenderness/nodules; no carotid   bruit or JVD  Back:    Spine nontender, no curvature, ROM normal, no CVA     tenderness  Lungs:     Clear to auscultation bilaterally without wheezes, rales or     ronchi; respirations unlabored  Chest Wall:    No tenderness or deformity   Heart:    Regular rate and rhythm, S1 and S2 normal, no murmur, rub   or gallop  Breast Exam:    No  chest wall tenderness, masses or gynecomastia  Abdomen:     Soft, non-tender, nondistended, normoactive bowel sounds,    no masses, no hepatosplenomegaly        Extremities:   No clubbing, cyanosis or edema  Pulses:   2+ and symmetric all extremities  Skin:   Skin color, texture, turgor normal, no rashes or lesions  Lymph nodes:   Cervical, supraclavicular, and axillary nodes normal  Neurologic:   CNII-XII intact, normal strength, sensation and gait; reflexes 2+ and symmetric throughout          Psych:   Normal mood, affect, hygiene and grooming.          Assessment & Plan:  Routine general medical examination at a health care facility - Plan: Pneumococcal conjugate vaccine 13-valent, CBC with Differential, Comprehensive metabolic panel, Lipid panel  HYPERTENSION - Plan: POCT Urinalysis Dipstick, amLODipine (NORVASC) 5 MG tablet, CBC with Differential, Comprehensive metabolic panel, Lipid panel  HYPERLIPIDEMIA - Plan: pravastatin (PRAVACHOL) 40 MG tablet, Lipid panel  ALLERGIC RHINITIS  BRADYCARDIA  SUPRAVENTRICULAR TACHYCARDIA, HX OF - Plan: CBC with Differential, Comprehensive metabolic panel, Lipid panel  History of prostate cancer  Erectile dysfunction following radical prostatectomy  I discussed Zostavax with him however at this point he  is holding off on this. I will also sign a form for him to get the erectile device. He will continue on his present medications.

## 2014-01-20 NOTE — Progress Notes (Signed)
   Subjective:    Patient ID: [name], male    DOB: 1932-04-25, 80 y.o.   MRN: [id number]  HPI    Review of Systems     Objective:   Physical Exam The cardiac exam showed a 2/6 SEM       Assessment & Plan:

## 2014-01-27 ENCOUNTER — Telehealth: Payer: Self-pay | Admitting: Internal Medicine

## 2014-01-27 ENCOUNTER — Other Ambulatory Visit: Payer: Self-pay

## 2014-01-27 MED ORDER — OMEPRAZOLE 20 MG PO CPDR: 20.0000 mg | DELAYED_RELEASE_CAPSULE | Freq: Every day | ORAL | Status: DC

## 2014-01-27 NOTE — Telephone Encounter (Signed)
Pt needs a refill on omeprazole 20mg  to [facility].

## 2014-02-07 ENCOUNTER — Encounter: Payer: Self-pay | Admitting: Family Medicine

## 2014-02-11 ENCOUNTER — Ambulatory Visit (INDEPENDENT_AMBULATORY_CARE_PROVIDER_SITE_OTHER): Payer: Medicare Other | Admitting: Ophthalmology

## 2014-02-28 ENCOUNTER — Telehealth: Payer: Self-pay | Admitting: Family Medicine

## 2014-02-28 NOTE — Telephone Encounter (Signed)
Pt called and stated that he has a vein in his neck and is large and pulses. He has actually had people recently comment on it recently. He states it is very noticeable. He just wants to make sure that it is ok and normal.

## 2014-02-28 NOTE — Telephone Encounter (Signed)
PT SAID THAT HE WASN'T CONCERNED ABOUT IT JUST HIS WIFE WANTED TO KNOW WHY BUT HE SAID HE FEELS FINE AND DOSENT WANT TO COME IN FOR AN APPOINTMENT

## 2014-02-28 NOTE — Telephone Encounter (Signed)
If he has any question about it have him make an appointment

## 2014-03-12 ENCOUNTER — Other Ambulatory Visit: Payer: Self-pay | Admitting: Family Medicine

## 2014-03-18 ENCOUNTER — Ambulatory Visit (INDEPENDENT_AMBULATORY_CARE_PROVIDER_SITE_OTHER): Payer: Medicare HMO | Admitting: Ophthalmology

## 2014-03-18 DIAGNOSIS — H35039 Hypertensive retinopathy, unspecified eye: Secondary | ICD-10-CM

## 2014-03-18 DIAGNOSIS — H33309 Unspecified retinal break, unspecified eye: Secondary | ICD-10-CM

## 2014-03-18 DIAGNOSIS — H43819 Vitreous degeneration, unspecified eye: Secondary | ICD-10-CM

## 2014-03-18 DIAGNOSIS — I10 Essential (primary) hypertension: Secondary | ICD-10-CM

## 2014-03-18 DIAGNOSIS — D3130 Benign neoplasm of unspecified choroid: Secondary | ICD-10-CM

## 2014-04-09 ENCOUNTER — Telehealth: Payer: Self-pay | Admitting: Family Medicine

## 2014-04-09 ENCOUNTER — Other Ambulatory Visit: Payer: Self-pay

## 2014-04-09 DIAGNOSIS — J309 Allergic rhinitis, unspecified: Secondary | ICD-10-CM

## 2014-04-09 MED ORDER — FLUTICASONE PROPIONATE 50 MCG/ACT NA SUSP: 2.0000 | Freq: Every day | NASAL | Status: DC

## 2014-04-09 NOTE — Telephone Encounter (Signed)
DONE

## 2014-05-09 ENCOUNTER — Other Ambulatory Visit: Payer: Self-pay

## 2014-06-12 ENCOUNTER — Encounter: Payer: Self-pay | Admitting: Family Medicine

## 2014-06-12 ENCOUNTER — Ambulatory Visit (INDEPENDENT_AMBULATORY_CARE_PROVIDER_SITE_OTHER): Payer: Medicare HMO | Admitting: Family Medicine

## 2014-06-12 VITALS — BP 120/84 | HR 60 | Temp 97.6°F

## 2014-06-12 DIAGNOSIS — K219 Gastro-esophageal reflux disease without esophagitis: Secondary | ICD-10-CM

## 2014-06-12 DIAGNOSIS — R011 Cardiac murmur, unspecified: Secondary | ICD-10-CM

## 2014-06-12 DIAGNOSIS — R010 Benign and innocent cardiac murmurs: Secondary | ICD-10-CM

## 2014-06-12 NOTE — Progress Notes (Signed)
Subjective:     Patient ID: [name], male   DOB: 05/24/1932, 80 y.o.   MRN: [id number]  HPI  This is an 80 year old male with a history of hiatal hernia w/ significant PMH including GERD, hypertension, and hyperlipidemia who presents with a 6 month history of chest congestion and dizziness while eating.  He reports that 6 months ago he began to develop a "heavy feeling" in his chest that he notices most while he is eating at night.  During these episodes he becomes dizzy and feels that if he doesn't stop eating he will pass out.  During the same period he has begun to experience episodes in the afternoon where, after laying down for a nap, he will have to expectorate thin mucus 6-7x.  He has been taking ranitidine and omeprazole once daily.  He has a remote 15 pack year history and rarely drinks.  His allergic rhinitis has been bothering him minimally recently.  Review of Systems  Denies fever, chils, headaches, otalgia, rhinorrhea, purulent sputum, dyspnea, chest pain, vomiting, or nausea.    Objective:   Physical Exam  Filed Vitals:   06/12/14 1555  BP: 120/84  Pulse: 60  Temp: 97.6 F (36.4 C)   General: well-appearing man in NAD HEENT: Tympanic membranes normal.  Nasal sinuses moist, non-erythematous.  Oropharynx without significant erythema or exudates.  No cervical lymphadenopathy, masses, or thyromegaly CV: Regular Rate & Rhythm with pansystolic grade 2/6 murmur best heard at the left midclavicular line.  S4 present. Pulm: Normal work of breathing.  Bibasilar inspiratory crackles present. Peripheral: 2+ symmetric radial pulses.  No cyanosis or clubbing of phalanges.  Minimal pitting edema to the mid-tibia.  CBC, CMP from 12/2013 wnl.  Lipid panel from 12/2013 only remarkable for LDL 114 mg/dL.  EKG today shows: 1st degree AV block with sinus bradycardia unchanged from prior EKG in 03/2013    Assessment:     Mr. Sorter symptoms and physical exam from today are most  consistent with GERD.  His episodes of lightheadedness when eating may represent vagal stimulation exacerbating his bradycardia.  While he does not endorse dyspepsia, GERD can occur without significant pain.  We will address this by escalating his omeprazole and monitoring response to therapy.  The finding of an S4 sound on exam with a systolic murmur suggesting mitral regurgitation is concerning for diastolic dysfunction and should be assessed with echocardiography.  It is reassuring that he has not had EKG changes from one year ago.  Furthermore he does not have significant symptoms of dyspnea on exertion.    Plan:     Undiagnosed cardiac murmurs - Plan: EKG 12-Lead, 2D Echocardiogram without contrast  Gastroesophageal reflux disease without esophagitis Increase omeprazole to 20 mg BID Monitor bradycardic response during periods of diziness

## 2014-06-16 ENCOUNTER — Telehealth: Payer: Self-pay | Admitting: Family Medicine

## 2014-06-16 NOTE — Telephone Encounter (Signed)
He called and stated that the 40 mg of Prilosec has eliminated all of his symptoms. I recommended that he stay on this dosing for at least a week and then if he can cut back without symptoms, that would be okay

## 2014-06-24 ENCOUNTER — Ambulatory Visit (INDEPENDENT_AMBULATORY_CARE_PROVIDER_SITE_OTHER): Payer: Medicare HMO | Admitting: Family Medicine

## 2014-06-24 DIAGNOSIS — M1990 Unspecified osteoarthritis, unspecified site: Secondary | ICD-10-CM

## 2014-06-24 NOTE — Patient Instructions (Signed)
Pain relief starting with Tylenol and then either Advil or Aleve if he use Advil or Aleve you can double the dosing. Watch her pain gets bad enough that it interferes with what you want to do on a daily basis, then its time to consider surgery

## 2014-06-24 NOTE — Progress Notes (Signed)
   Subjective:    Patient ID: [name], male    DOB: 09-Apr-1932, 80 y.o.   MRN: [id number]  HPI He is here for consult concerning difficulty with right knee pain. He was seen recently and x-rays did show degenerative changes and he states they told it was bone-on-bone. Fluid was drawn off and he was given a steroid however it did not last very long. He has questions concerning proper care of this.   Review of Systems     Objective:   Physical Exam Alert and in no distress. Slight effusion is noted in the right knee.       Assessment & Plan:  Arthritis  I discussed care of degenerative point disease with him. Since he does have significant arthritic changes that are start negative for with that quality of his life, he should consider at some point in the near future having surgical procedure. Discussed with Tylenol followed by Advil or Aleve and then potentially more potent medication. Did explain that if he gets that point then he should strongly consider getting a knee replacement. All his questions were answered.

## 2014-08-04 ENCOUNTER — Telehealth: Payer: Self-pay | Admitting: Family Medicine

## 2014-08-04 ENCOUNTER — Other Ambulatory Visit: Payer: Self-pay

## 2014-08-04 MED ORDER — OMEPRAZOLE 20 MG PO CPDR: 20.0000 mg | DELAYED_RELEASE_CAPSULE | Freq: Two times a day (BID) | ORAL | Status: DC

## 2014-08-04 NOTE — Telephone Encounter (Signed)
Pt called and stated he needs a refill on Omeprazole 20 mg. He states his dose changed to 20 mg TWICE a day. JCL changed it at last appt. He also has a NEW PHARMACY. NEW PHARMACY IS [facility]. Pt can be reach at [phone].

## 2014-08-04 NOTE — Telephone Encounter (Signed)
Dr.Lalonde did not change his pharmacy i did and sent his med bid

## 2014-08-06 ENCOUNTER — Telehealth: Payer: Self-pay | Admitting: Internal Medicine

## 2014-08-06 ENCOUNTER — Other Ambulatory Visit: Payer: Self-pay

## 2014-08-06 DIAGNOSIS — I10 Essential (primary) hypertension: Secondary | ICD-10-CM

## 2014-08-06 DIAGNOSIS — E785 Hyperlipidemia, unspecified: Secondary | ICD-10-CM

## 2014-08-06 DIAGNOSIS — Z9109 Other allergy status, other than to drugs and biological substances: Secondary | ICD-10-CM

## 2014-08-06 MED ORDER — AMLODIPINE BESYLATE 5 MG PO TABS: 5.0000 mg | ORAL_TABLET | Freq: Every day | ORAL | Status: DC

## 2014-08-06 MED ORDER — PRAVASTATIN SODIUM 40 MG PO TABS: 40.0000 mg | ORAL_TABLET | Freq: Every day | ORAL | Status: DC

## 2014-08-06 MED ORDER — FLUTICASONE PROPIONATE 50 MCG/ACT NA SUSP: 2.0000 | Freq: Every day | NASAL | Status: DC

## 2014-08-06 NOTE — Telephone Encounter (Signed)
Sent meds to [facility] as per pt request

## 2014-08-06 NOTE — Telephone Encounter (Signed)
Request for amlodipine, pravastatin, fluticasone to [facility]

## 2014-08-06 NOTE — Telephone Encounter (Signed)
done

## 2014-09-01 DIAGNOSIS — H40013 Open angle with borderline findings, low risk, bilateral: Secondary | ICD-10-CM | POA: Diagnosis not present

## 2014-09-01 DIAGNOSIS — H35033 Hypertensive retinopathy, bilateral: Secondary | ICD-10-CM | POA: Diagnosis not present

## 2014-09-01 DIAGNOSIS — H43813 Vitreous degeneration, bilateral: Secondary | ICD-10-CM | POA: Diagnosis not present

## 2014-09-01 DIAGNOSIS — Z961 Presence of intraocular lens: Secondary | ICD-10-CM | POA: Diagnosis not present

## 2014-09-02 ENCOUNTER — Telehealth: Payer: Self-pay | Admitting: Family Medicine

## 2014-09-02 NOTE — Telephone Encounter (Signed)
Pt called and stated that he heard on the news last night that long term use of anti-acids is harmful. He would like to talk to you and discuss this with you. Pt can be reached at [phone].

## 2014-09-03 DIAGNOSIS — M545 Low back pain: Secondary | ICD-10-CM | POA: Diagnosis not present

## 2014-09-03 DIAGNOSIS — M419 Scoliosis, unspecified: Secondary | ICD-10-CM | POA: Diagnosis not present

## 2014-09-03 DIAGNOSIS — M47816 Spondylosis without myelopathy or radiculopathy, lumbar region: Secondary | ICD-10-CM | POA: Diagnosis not present

## 2014-09-15 DIAGNOSIS — M4134 Thoracogenic scoliosis, thoracic region: Secondary | ICD-10-CM | POA: Diagnosis not present

## 2014-09-15 DIAGNOSIS — M419 Scoliosis, unspecified: Secondary | ICD-10-CM | POA: Diagnosis not present

## 2014-09-15 DIAGNOSIS — M4306 Spondylolysis, lumbar region: Secondary | ICD-10-CM | POA: Diagnosis not present

## 2014-09-22 DIAGNOSIS — M4134 Thoracogenic scoliosis, thoracic region: Secondary | ICD-10-CM | POA: Diagnosis not present

## 2014-09-22 DIAGNOSIS — M419 Scoliosis, unspecified: Secondary | ICD-10-CM | POA: Diagnosis not present

## 2014-09-22 DIAGNOSIS — M4306 Spondylolysis, lumbar region: Secondary | ICD-10-CM | POA: Diagnosis not present

## 2014-10-06 DIAGNOSIS — M4134 Thoracogenic scoliosis, thoracic region: Secondary | ICD-10-CM | POA: Diagnosis not present

## 2014-10-06 DIAGNOSIS — M4306 Spondylolysis, lumbar region: Secondary | ICD-10-CM | POA: Diagnosis not present

## 2014-10-06 DIAGNOSIS — M419 Scoliosis, unspecified: Secondary | ICD-10-CM | POA: Diagnosis not present

## 2014-10-22 ENCOUNTER — Other Ambulatory Visit: Payer: Self-pay

## 2014-10-22 ENCOUNTER — Telehealth: Payer: Self-pay | Admitting: Family Medicine

## 2014-10-22 MED ORDER — DESONIDE 0.05 % EX CREA: TOPICAL_CREAM | Freq: Two times a day (BID) | CUTANEOUS | Status: DC

## 2014-10-22 NOTE — Telephone Encounter (Signed)
done

## 2014-10-22 NOTE — Telephone Encounter (Signed)
Pt called and needs refill Desonide .05% sent to his mail order/  Pt ph [phone]

## 2014-10-24 ENCOUNTER — Telehealth: Payer: Self-pay | Admitting: Family Medicine

## 2014-10-24 NOTE — Telephone Encounter (Signed)
Pt states Desonide cream with his new ins is Tier 4 & cost is $100, wants to know if there is anything else OTC or less expensive that he could use for his Eczema.  If not I can try for Tier exception, please advise.

## 2014-10-24 NOTE — Telephone Encounter (Signed)
Patient informed word for word and verbalized understanding

## 2014-10-24 NOTE — Telephone Encounter (Signed)
He can certainly try over-the-counter cortisone cream

## 2014-11-18 ENCOUNTER — Telehealth: Payer: Self-pay | Admitting: Family Medicine

## 2014-11-18 DIAGNOSIS — Z9109 Other allergy status, other than to drugs and biological substances: Secondary | ICD-10-CM

## 2014-11-18 MED ORDER — FLUTICASONE PROPIONATE 50 MCG/ACT NA SUSP: 2.0000 | Freq: Every day | NASAL | Status: DC

## 2014-11-18 NOTE — Telephone Encounter (Signed)
done

## 2014-11-18 NOTE — Telephone Encounter (Signed)
Requesting refill on Fluticasone 32mcg

## 2014-11-19 NOTE — Telephone Encounter (Signed)
Pt called & requested Tier exception.  This was done & faxed

## 2014-11-20 NOTE — Telephone Encounter (Signed)
P.A. Isabelle Course, pt must try formulary alternatives.   Alclometasone, Fluticasone, Mometasone, Triderm or Triamcinolone acetonide  Faxed appeal letter

## 2014-11-21 ENCOUNTER — Telehealth: Payer: Self-pay | Admitting: Family Medicine

## 2014-11-21 NOTE — Telephone Encounter (Signed)
Pt wants to try the Alclometasone 0.5 % cream that insurance will pay for 15 gms for $20.  Please send new Rx into [facility]

## 2014-11-22 NOTE — Telephone Encounter (Signed)
I think that the spelling is wrong. Send the correct one

## 2014-11-24 ENCOUNTER — Telehealth: Payer: Self-pay | Admitting: Family Medicine

## 2014-11-24 NOTE — Telephone Encounter (Signed)
Pt notified but wants to know how long he can take this med?  Like shortterm or what can he do for longterm?

## 2014-11-24 NOTE — Telephone Encounter (Signed)
Recv'd faxed refill request for Alclometasone cream, need directions, form in your folder This is preferred medication and pt wants to try this

## 2014-11-24 NOTE — Telephone Encounter (Signed)
For couple weeks that he'll need to come in and discuss this further with me

## 2014-11-24 NOTE — Telephone Encounter (Signed)
Pt called and stated that he takes  2 200mg  ibuprofen  Twice a day. He wants to know how much can he take daily and for how long can he take it. Please call pt at [phone]

## 2014-11-24 NOTE — Telephone Encounter (Signed)
He can take 4 tablets 3 times per day

## 2014-11-24 NOTE — Telephone Encounter (Signed)
Please check on this. i am not sure what he is talking about?

## 2014-11-25 NOTE — Telephone Encounter (Signed)
Pt was switched to

## 2014-11-25 NOTE — Telephone Encounter (Signed)
Pt notified and will call back to schedule appt.

## 2014-11-26 NOTE — Telephone Encounter (Signed)
Pt was switched to Alclometasone .05% cream apply bid per fax

## 2014-12-08 ENCOUNTER — Telehealth: Payer: Self-pay | Admitting: Family Medicine

## 2014-12-08 NOTE — Telephone Encounter (Signed)
Appeal for Desonide has now been approved til 07/25/15, Pt informed

## 2014-12-19 ENCOUNTER — Telehealth: Payer: Self-pay | Admitting: Family Medicine

## 2014-12-19 ENCOUNTER — Other Ambulatory Visit: Payer: Self-pay | Admitting: *Deleted

## 2014-12-19 MED ORDER — DESONIDE 0.05 % EX CREA: TOPICAL_CREAM | Freq: Two times a day (BID) | CUTANEOUS | Status: DC

## 2014-12-19 NOTE — Telephone Encounter (Signed)
Error LJ 5-27

## 2014-12-29 ENCOUNTER — Telehealth: Payer: Self-pay | Admitting: Family Medicine

## 2014-12-29 NOTE — Telephone Encounter (Signed)
Please call patient  Re: desonide cream Rx Apparently he was give wrong Rx and would like someone to call him

## 2014-12-29 NOTE — Telephone Encounter (Signed)
Called [facility] spoke with rosalind [phone] the patient wanted what was sent in 15g this was sent in on 12/19/14 by V he said they sent him 60g with no refill he said that he couldn't [place] that much and he wanted the 15 g  So I informed the pharmacist Mackie Pai that is what the patient wanted she said she would put it in to be sent out patient has been informed

## 2014-12-29 NOTE — Telephone Encounter (Signed)
Called pt left message to call me back it looks like that is what his ins. Would pay for

## 2015-02-02 ENCOUNTER — Ambulatory Visit
Admission: RE | Admit: 2015-02-02 | Discharge: 2015-02-02 | Disposition: A | Discharge status: Home / Self Care | Payer: Medicare Other | Source: Ambulatory Visit | Attending: Family Medicine | Admitting: Family Medicine

## 2015-02-02 ENCOUNTER — Ambulatory Visit (INDEPENDENT_AMBULATORY_CARE_PROVIDER_SITE_OTHER): Payer: Medicare Other | Admitting: Family Medicine

## 2015-02-02 ENCOUNTER — Encounter: Payer: Self-pay | Admitting: Family Medicine

## 2015-02-02 VITALS — BP 150/80 | HR 60 | Temp 98.1°F | Ht 67.0 in | Wt 162.8 lb

## 2015-02-02 DIAGNOSIS — R010 Benign and innocent cardiac murmurs: Secondary | ICD-10-CM

## 2015-02-02 DIAGNOSIS — R042 Hemoptysis: Secondary | ICD-10-CM

## 2015-02-02 DIAGNOSIS — R05 Cough: Secondary | ICD-10-CM

## 2015-02-02 DIAGNOSIS — R011 Cardiac murmur, unspecified: Secondary | ICD-10-CM

## 2015-02-02 DIAGNOSIS — R059 Cough, unspecified: Secondary | ICD-10-CM

## 2015-02-02 MED ORDER — AZITHROMYCIN 250 MG PO TABS: ORAL_TABLET | ORAL | Status: DC

## 2015-02-02 NOTE — Progress Notes (Signed)
Chief Complaint  Patient presents with  . Cough    had a cold 01/13/15 that stated in his head and chest, lasted for couple days. Has stayed somewhat in his head and chest since. Has been coughing up a thick mucus that appears to have some blood in it. (brought in to show). Sneezing all day long.    Patient has been sick with URI symptoms since 6/21.  It started in his chest, moved into his chest.  Phlegm has been yellow, then became clear, then back to yellow.  This morning he noticed some blood mixed in with the phlegm.  He has h/o allergies, but he has a lot more mucus in his head than normal.  He continues to have intermittent sinus congestions. He doesn't normally have chest congestion like this. He has been using Mucinex DM which only temporarily helps.   Denies fevers, chills. No sick contacts.  Denies any pain with breathing or shortness of breath.  PMH, PSH. SH reviewed.  Outpatient Encounter Prescriptions as of 02/02/2015  Medication Sig Note  . amLODipine (NORVASC) 5 MG tablet Take 1 tablet (5 mg total) by mouth daily.   Marland Kitchen aspirin 81 MG tablet Take 81 mg by mouth 2 (two) times daily.    . cetirizine (ZYRTEC) 10 MG tablet Take 10 mg by mouth daily.   Marland Kitchen desonide (DESOWEN) 0.05 % cream Apply topically 2 (two) times daily.   . fish oil-omega-3 fatty acids 1000 MG capsule Take 1,200 mg by mouth daily.   . fluticasone (FLONASE) 50 MCG/ACT nasal spray Place 2 sprays into both nostrils daily.   Marland Kitchen glucosamine-chondroitin 500-400 MG tablet Take 1 tablet by mouth 3 (three) times daily.     . GuaiFENesin (MUCINEX PO) Take 1 tablet by mouth daily. 02/02/2015: Using prn, once daily (recently tried his wife's Mucinex DM)  . Multiple Vitamins-Minerals (MULTIVITAMIN WITH MINERALS) tablet Take 1 tablet by mouth daily.     Marland Kitchen omeprazole (PRILOSEC) 20 MG capsule Take 1 capsule (20 mg total) by mouth 2 (two) times daily before a meal.   . pravastatin (PRAVACHOL) 40 MG tablet Take 1 tablet (40 mg total) by  mouth daily.   . [DISCONTINUED] ranitidine (ZANTAC) 300 MG tablet Take 1 tablet (300 mg total) by mouth at bedtime.   . [DISCONTINUED] HYDROcodone-acetaminophen (NORCO/VICODIN) 5-325 MG per tablet TAKE 1 TABLET BY MOUTH EVERY 4 TO 6 HOURS AS NEEDED FOR PAIN 02/02/2015: Received from: External Pharmacy   No facility-administered encounter medications on file as of 02/02/2015.   ROS: no fever, chills, headaches, dizziness, sinus pain, sore throat.  Denies chest pain, palpitations, shortness of breath.  Denies nausea, vomiting, bowel changes, fatigue, bleeding, bruising, rash or other complaints  PHYSICAL EXAM: BP 150/80 mmHg  Pulse 60  Temp(Src) 98.1 F (36.7 C) (Tympanic)  Ht 5\' 7"  (1.702 m)  Wt 162 lb 12.8 oz (73.846 kg)  BMI 25.49 kg/m2  Well developed, well-appearing male, appearing stated in age, in no distress HEENT: PERRL, EOMI, conjunctiva clear.  Clear mucus in the left nares R nares has some superficial recent bleed anteriorly. Sinuses are nontender. OP clear without erythema Neck: no lymphadenopathy,  thyromegaly or mass Heart: regular rate and rhythm with a blowing murmur loudest at the apex Lungs clear bilaterally, with good air movement Abdomen: soft, nontender, no mass Extremities: no edema Neuro: alert and oriented. Normal gait strength  ASSESSMENT/PLAN:  Cough - suspect sinobronchitis.  treat with amox; continue mucinex. - Plan: DG Chest 2 View, azithromycin (  ZITHROMAX) 250 MG tablet  Hemoptysis - suspect source of blood is from his nose. discussed proper use of flonase, and use of saline nasally prn. May need further evali f persits.  check CXR - Plan: DG Chest 2 View  Heart murmur previously undiagnosed - echo (previously ordered by [facility] in November--will get it scheduled) - Plan: DG Chest 2 View  Heart murmur--hasn't seen cardiologist since 05/2013. Check echo (as previously ordered); may need to f/u with cardiologist Looks like echo was ordered in 2015--I don't  see any result, just future order. Ordered in November for murmur and S4, never done  CXR today Set up for Echo  Treat with zpak for sinobronchitis. Risks/side effects and OTC's were reviewed in detail  Drink plenty of fluids. Continue the flonase and zyrtec. When using the flonase, try and aim away from the septum (more slightly towards the outside of the nose rather than the middle).  Use a saline spray frequently to the nostrils, and this should help prevent any bleeding. Continue the mucinex to keep the phlegm and mucus thin. Take the antibiotic as directed--you only take it for 5 days, but it works for 10 days.  Return next week if you are not any better. Go to [facility] today (either [address] or [place]) for a chest x-ray. I would like for you to get the echocardiogram (ultrasound of the heart) that Dr. Redmond School had recommended back in November, to evaluate your heart murmur.

## 2015-02-02 NOTE — Patient Instructions (Signed)
  Drink plenty of fluids. Continue the flonase and zyrtec. When using the flonase, try and aim away from the septum (more slightly towards the outside of the nose rather than the middle).  Use a saline spray frequently to the nostrils, and this should help prevent any bleeding. Continue the mucinex to keep the phlegm and mucus thin. Take the antibiotic as directed--you only take it for 5 days, but it works for 10 days.  Return next week if you are not any better. Go to [facility] today (either [address] or [place]) for a chest x-ray. I would like for you to get the echocardiogram (ultrasound of the heart) that Dr. Redmond School had recommended back in November, to evaluate your heart murmur.  Seek immediate care if shortness of breath, chest pain, significant bleeding noticed.

## 2015-02-10 ENCOUNTER — Other Ambulatory Visit: Payer: Self-pay | Admitting: Family Medicine

## 2015-02-10 ENCOUNTER — Ambulatory Visit (HOSPITAL_COMMUNITY)
Admission: RE | Admit: 2015-02-10 | Discharge: 2015-02-10 | Disposition: A | Discharge status: Home / Self Care | Payer: Medicare Other | Source: Ambulatory Visit | Attending: Family Medicine | Admitting: Family Medicine

## 2015-02-10 ENCOUNTER — Telehealth: Payer: Self-pay | Admitting: Internal Medicine

## 2015-02-10 DIAGNOSIS — R011 Cardiac murmur, unspecified: Secondary | ICD-10-CM

## 2015-02-10 DIAGNOSIS — I340 Nonrheumatic mitral (valve) insufficiency: Secondary | ICD-10-CM | POA: Insufficient documentation

## 2015-02-10 DIAGNOSIS — I059 Rheumatic mitral valve disease, unspecified: Secondary | ICD-10-CM | POA: Insufficient documentation

## 2015-02-10 DIAGNOSIS — R010 Benign and innocent cardiac murmurs: Secondary | ICD-10-CM

## 2015-02-10 DIAGNOSIS — I351 Nonrheumatic aortic (valve) insufficiency: Secondary | ICD-10-CM | POA: Insufficient documentation

## 2015-02-10 NOTE — Telephone Encounter (Signed)
Called and did Pre-cert for pt to get Echo done today. Pt is approved today-03/27/15. Auth # [id number]. Butch Penny @ echo was aware of approval. Phone # for Dyan Schwab [phone]

## 2015-02-10 NOTE — Progress Notes (Signed)
  Echocardiogram 2D Echocardiogram has been performed.  Tametra Ahart 02/10/2015, 11:18 AM

## 2015-02-18 ENCOUNTER — Ambulatory Visit (INDEPENDENT_AMBULATORY_CARE_PROVIDER_SITE_OTHER): Payer: Medicare Other | Admitting: Family Medicine

## 2015-02-18 VITALS — BP 104/60 | Wt 162.0 lb

## 2015-02-18 DIAGNOSIS — R059 Cough, unspecified: Secondary | ICD-10-CM

## 2015-02-18 DIAGNOSIS — J209 Acute bronchitis, unspecified: Secondary | ICD-10-CM

## 2015-02-18 DIAGNOSIS — R05 Cough: Secondary | ICD-10-CM

## 2015-02-18 MED ORDER — AZITHROMYCIN 500 MG PO TABS: 500.0000 mg | ORAL_TABLET | Freq: Every day | ORAL | Status: DC

## 2015-02-18 NOTE — Progress Notes (Signed)
   Subjective:    Patient ID: [name], male    DOB: 1932/05/22, 81 y.o.   MRN: [id number]  HPI He was seen on July 11 and treated with azithromycin. He states that it did help with his head congestion and drainage however he is still having a slight cough that is intermittently productive. No fever, chills, sore throat or earache.   Review of Systems     Objective:   Physical Exam Alert and in no distress. Tympanic membranes and canals are normal. Pharyngeal area is normal. Neck is supple without adenopathy or thyromegaly. Cardiac exam shows a regular sinus rhythm with a systolic murmur, no gallops. Lungs are clear to auscultation.        Assessment & Plan:  Cough - Plan: azithromycin (ZITHROMAX) 500 MG tablet  Acute bronchitis, unspecified organism - Plan: azithromycin (ZITHROMAX) 500 MG tablet  he is to call after 7-10 days if not totally back to normal. May possibly need to be switched to a different anabiotic if no real improvement. He does have a follow-up appointment with cardiology concerning the murmur.

## 2015-02-18 NOTE — Patient Instructions (Addendum)
Take the antibiotic but if not back to normal in 10 days give me a call.

## 2015-02-22 ENCOUNTER — Encounter: Payer: Self-pay | Admitting: Nurse Practitioner

## 2015-02-22 NOTE — Progress Notes (Addendum)
Electrophysiology Office Note Date: 02/23/2015  ID:  ARIZ TERRONES, DOB 1932-01-19, MRN [id number]  PCP: Wyatt Haste, MD Electrophysiologist: Lovena Le  CC: discuss echocardiogram results  [name] is a 81 y.o. male seen today for Dr Lovena Le.  He was last seen in 2014 for follow up of SVT and high grade conduction system disease.  He has undergone previous ablations and has had abnormal conduction since that has been asymptomatic.  An echocardiogram was recently obtained by his PCP which demonstrated normal LVEF, mild AR, and mild MR for which he was referred back today. Since last being seen in our clinic, the patient reports doing very well.  He denies chest pain, palpitations, dyspnea, PND, orthopnea, nausea, vomiting, dizziness, syncope.  He remains very active working in the yard and playing golf without symptoms.   Past Medical History  Diagnosis Date  . Hypercholesterolemia   . Allergy   . Prostate cancer 1997  . HH (hiatus hernia)   . History of colonic polyps   . Eczema   . Arthritis   . Hypertension   . Cataracts, bilateral   . SVT (supraventricular tachycardia)   . 1St degree AV block    Past Surgical History  Procedure Laterality Date  . Prostate surgery    . Avnode reentrant ablation    . Colonoscopy  2007    [facility]  . Testicle surgery  2010    [facility]    Current Outpatient Prescriptions  Medication Sig Dispense Refill  . amLODipine (NORVASC) 5 MG tablet Take 1 tablet (5 mg total) by mouth daily. 90 tablet 3  . aspirin 81 MG tablet Take 81 mg by mouth 2 (two) times daily.     Marland Kitchen aspirin 81 MG tablet Take 81 mg by mouth daily. Pt takes 2 tablets by mouth daily    . cetirizine (ZYRTEC) 10 MG tablet Take 10 mg by mouth daily.    Marland Kitchen desonide (DESOWEN) 0.05 % cream Apply topically 2 (two) times daily. 15 g 1  . fish oil-omega-3 fatty acids 1000 MG capsule Take 1,200 mg by mouth daily.    . fluticasone (FLONASE) 50 MCG/ACT nasal spray Place  2 sprays into both nostrils daily. 16 g 1  . glucosamine-chondroitin 500-400 MG tablet Take 1 tablet by mouth 3 (three) times daily.      . GuaiFENesin (MUCINEX PO) Take 1 tablet by mouth as needed.     . Multiple Vitamins-Minerals (MULTIVITAMIN WITH MINERALS) tablet Take 1 tablet by mouth daily.      Marland Kitchen omeprazole (PRILOSEC) 20 MG capsule Take 1 capsule (20 mg total) by mouth 2 (two) times daily before a meal. 180 capsule 3  . pravastatin (PRAVACHOL) 40 MG tablet Take 1 tablet (40 mg total) by mouth daily. 90 tablet 3   No current facility-administered medications for this visit.    Allergies:   Codeine   Social History: History   Social History  . Marital Status: Married    Spouse Name: N/A  . Number of Children: N/A  . Years of Education: N/A   Occupational History  . Not on file.   Social History Main Topics  . Smoking status: Never Smoker   . Smokeless tobacco: Never Used  . Alcohol Use: No  . Drug Use: No  . Sexual Activity: Yes   Other Topics Concern  . Not on file   Social History Narrative    Family History: Family History  Problem Relation Age of Onset  .  Cancer Mother   . Alcoholism Father     Review of Systems: All other systems reviewed and are otherwise negative except as noted above.   Physical Exam: VS:  BP 162/84 mmHg  Pulse 52  Ht 5\' 7"  (1.702 m)  Wt 162 lb 12.8 oz (73.846 kg)  BMI 25.49 kg/m2 , BMI Body mass index is 25.49 kg/(m^2). Wt Readings from Last 3 Encounters:  02/23/15 162 lb 12.8 oz (73.846 kg)  02/18/15 162 lb (73.483 kg)  02/02/15 162 lb 12.8 oz (73.846 kg)    GEN- The patient is well appearing, alert and oriented x 3 today.   HEENT: normocephalic, atraumatic; sclera clear, conjunctiva pink; hearing intact; oropharynx clear; neck supple  Lungs- Clear to ausculation bilaterally, normal work of breathing.  No wheezes, rales, rhonchi Heart- Bradycardic regular rate and rhythm  GI- soft, non-tender, non-distended, bowel sounds  present Extremities- no clubbing, cyanosis, or edema; DP/PT/radial pulses 2+ bilaterally MS- no significant deformity or atrophy, +right knee effusion Skin- warm and dry, no rash or lesion  Psych- euthymic mood, full affect Neuro- strength and sensation are intact   EKG:  EKG is ordered today. The ekg ordered today shows sinus bradycardia, rate 52, 1st degree AV block, poor R wave progression  Recent Labs: No results found for requested labs within last 365 days.    Other studies Reviewed: Additional studies/ records that were reviewed today include:Dr [place] office notes, echo  Assessment and Plan: 1.  AR/MR The patient has been found to have mild AR and mild MR on recent echo He is asymptomatic  Reassurance given today  2. SVT No recent recurrence  3.  Conduction system disease The patient has longstanding conduction system disease which has been asymptomatic.  No further work up planned at this time.  He has been advised to seek further evaluation if he develops symptoms of dizziness, pre-syncope, or syncope.   4.  HTN BP elevated today, he does not follow at home No changes made today, defer management to PCP   Current medicines are reviewed at length with the patient today.   The patient does not have concerns regarding his medicines.  The following changes were made today:  none  Labs/ tests ordered today include:none   Disposition:   Follow up with cardiology as needed   Signed, Chanetta Marshall, NP 02/23/2015 12:17 PM   [facility] [address] [place] [address] [phone] (office) [phone] (fax)

## 2015-02-23 ENCOUNTER — Encounter: Payer: Self-pay | Admitting: Nurse Practitioner

## 2015-02-23 ENCOUNTER — Ambulatory Visit (INDEPENDENT_AMBULATORY_CARE_PROVIDER_SITE_OTHER): Payer: Medicare Other | Admitting: Nurse Practitioner

## 2015-02-23 ENCOUNTER — Telehealth: Payer: Self-pay | Admitting: Family Medicine

## 2015-02-23 VITALS — BP 162/84 | HR 52 | Ht 67.0 in | Wt 162.8 lb

## 2015-02-23 DIAGNOSIS — I340 Nonrheumatic mitral (valve) insufficiency: Secondary | ICD-10-CM | POA: Diagnosis not present

## 2015-02-23 DIAGNOSIS — I440 Atrioventricular block, first degree: Secondary | ICD-10-CM | POA: Diagnosis not present

## 2015-02-23 DIAGNOSIS — I471 Supraventricular tachycardia: Secondary | ICD-10-CM | POA: Diagnosis not present

## 2015-02-23 NOTE — Patient Instructions (Signed)
Medication Instructions:   Your physician recommends that you continue on your current medications as directed. Please refer to the Current Medication list given to you today.    Labwork:   Testing/Procedures:   Follow-Up:  AS NEEDED FOR ANY CARDIAC RELATED SYMPTOMS  Any Other Special Instructions Will Be Listed Below (If Applicable).

## 2015-02-23 NOTE — Telephone Encounter (Signed)
Pt called and stated that he went to the cardiologist office today and saw a PA. He states it was a complete waste of time. She told him everything was fine and she could have done that over the phone. Pt doesn't understand why he was sent over there in the first place. He would like you to call him. He stated he would like to talk to you and not Cheri. Please call pt at [phone].

## 2015-03-03 DIAGNOSIS — M1711 Unilateral primary osteoarthritis, right knee: Secondary | ICD-10-CM | POA: Diagnosis not present

## 2015-03-10 ENCOUNTER — Ambulatory Visit (INDEPENDENT_AMBULATORY_CARE_PROVIDER_SITE_OTHER): Payer: Medicare Other | Admitting: Family Medicine

## 2015-03-10 ENCOUNTER — Encounter: Payer: Self-pay | Admitting: Family Medicine

## 2015-03-10 VITALS — BP 120/70 | HR 50 | Ht 67.0 in | Wt 163.0 lb

## 2015-03-10 DIAGNOSIS — E785 Hyperlipidemia, unspecified: Secondary | ICD-10-CM

## 2015-03-10 DIAGNOSIS — Z136 Encounter for screening for cardiovascular disorders: Secondary | ICD-10-CM | POA: Diagnosis not present

## 2015-03-10 DIAGNOSIS — Z0000 Encounter for general adult medical examination without abnormal findings: Secondary | ICD-10-CM | POA: Diagnosis not present

## 2015-03-10 DIAGNOSIS — K449 Diaphragmatic hernia without obstruction or gangrene: Secondary | ICD-10-CM

## 2015-03-10 DIAGNOSIS — Z8546 Personal history of malignant neoplasm of prostate: Secondary | ICD-10-CM | POA: Diagnosis not present

## 2015-03-10 DIAGNOSIS — I348 Other nonrheumatic mitral valve disorders: Secondary | ICD-10-CM | POA: Diagnosis not present

## 2015-03-10 DIAGNOSIS — I340 Nonrheumatic mitral (valve) insufficiency: Secondary | ICD-10-CM | POA: Diagnosis not present

## 2015-03-10 DIAGNOSIS — J3089 Other allergic rhinitis: Secondary | ICD-10-CM | POA: Diagnosis not present

## 2015-03-10 DIAGNOSIS — R001 Bradycardia, unspecified: Secondary | ICD-10-CM

## 2015-03-10 DIAGNOSIS — I10 Essential (primary) hypertension: Secondary | ICD-10-CM

## 2015-03-10 DIAGNOSIS — M1990 Unspecified osteoarthritis, unspecified site: Secondary | ICD-10-CM | POA: Diagnosis not present

## 2015-03-10 DIAGNOSIS — M129 Arthropathy, unspecified: Secondary | ICD-10-CM | POA: Diagnosis not present

## 2015-03-10 DIAGNOSIS — I351 Nonrheumatic aortic (valve) insufficiency: Secondary | ICD-10-CM | POA: Diagnosis not present

## 2015-03-10 DIAGNOSIS — I359 Nonrheumatic aortic valve disorder, unspecified: Secondary | ICD-10-CM | POA: Diagnosis not present

## 2015-03-10 LAB — POCT URINALYSIS DIPSTICK
Bilirubin, UA: NEGATIVE
Blood, UA: NEGATIVE
Glucose, UA: NEGATIVE
KETONES UA: NEGATIVE
Leukocytes, UA: NEGATIVE
Nitrite, UA: NEGATIVE
Protein, UA: NEGATIVE
SPEC GRAV UA: 1.015
UROBILINOGEN UA: NEGATIVE
pH, UA: 5.5

## 2015-03-10 NOTE — Progress Notes (Signed)
Subjective:    Patient ID: [name], male    DOB: 02/22/1932, 81 y.o.   MRN: [id number]  HPI He is here for a complete examination. He continues have difficulty with chronic postnasal drainage and continues on his present allergy medications. He also has arthritis and has had a knee injection with good results. He states that he was told he can get these every 3 months. He has noted some difficulty with decreased range of motion of the left arm. No history of injury to that. He does have a history of hiatus hernia and is taking Prilosec. Other than clearing his throat, he has no other symptoms of reflux. He has been seen recently by cardiology and does have evidence of MR and AR.He has a long history of  bradycardia And hypertension.He has a previous history of prostate cancer and is now past 5 years.He also has remote history of smoking back when he was in his late 23s.He has no chest pain, shortness of breath, nausea, abdominal pain. Immunizations, health maintenance, social and family history was reviewed and is unchanged.He does have an advanced directive.  Review of Systems  All other systems reviewed and are negative.      Objective:   Physical Exam BP 120/70 mmHg  Pulse 50  Ht 5\' 7"  (1.702 m)  Wt 163 lb (73.936 kg)  BMI 25.52 kg/m2  SpO2 98%  General Appearance:    Alert, cooperative, no distress, appears stated age  Head:    Normocephalic, without obvious abnormality, atraumatic  Eyes:    PERRL, conjunctiva/corneas clear, EOM's intact, fundi    benign  Ears:    Normal TM's and external ear canals  Nose:   Nares normal, mucosa normal, no drainage or sinus   tenderness  Throat:   Lips, mucosa, and tongue normal; teeth and gums normal  Neck:   Supple, no lymphadenopathy;  thyroid:  no   enlargement/tenderness/nodules; no carotid   bruit or JVD  Back:    Spine nontender, no curvature, ROM normal, no CVA     tenderness  Lungs:     Clear to auscultation bilaterally without  wheezes, rales or     ronchi; respirations unlabored  Chest Wall:    No tenderness or deformity   Heart:    Regular rate and rhythm, S1 and S2 normal, 2/6 ? Systolic murmur is noted, no rub.   or gallop  Breast Exam:    No chest wall tenderness, masses or gynecomastia  Abdomen:     Soft, non-tender, nondistended, normoactive bowel sounds,    no masses, no hepatosplenomegaly        Extremities:   No clubbing, cyanosis or edema.Slight decreased range of motion of the left shoulder is noted. No popping or grinding.  Pulses:   2+ and symmetric all extremities  Skin:   Skin color, texture, turgor normal, no rashes or lesions  Lymph nodes:   Cervical, supraclavicular, and axillary nodes normal  Neurologic:   CNII-XII intact, normal strength, sensation and gait; reflexes 2+ and symmetric throughout          Psych:   Normal mood, affect, hygiene and grooming.          Assessment & Plan:  Routine general medical examination at a health care facility - Plan: POCT Urinalysis Dipstick, CBC with Differential/Platelet, Comprehensive metabolic panel, Lipid panel, US Aorta Initial Medicare Screen  Hyperlipidemia LDL goal <130 - Plan: Lipid panel  Arthritis - Plan: CBC with Differential/Platelet, Comprehensive  metabolic panel  HH (hiatus hernia)  Other allergic rhinitis  MR (mitral regurgitation) - Plan: CBC with Differential/Platelet, Comprehensive metabolic panel, Lipid panel  Aortic regurgitation - Plan: CBC with Differential/Platelet, Comprehensive metabolic panel, Lipid panel  Screening for AAA (abdominal aortic aneurysm) - Plan: US Aorta Initial Medicare Screen  History of prostate cancer  Essential hypertension  Bradycardia He will continue on his present medication regimen. Did recommend working on range of motion of the left shoulder.  Discussed getting another shingles vaccine and did give him a prescription.

## 2015-03-11 LAB — COMPREHENSIVE METABOLIC PANEL
ALT: 28 U/L (ref 9–46)
AST: 21 U/L (ref 10–35)
Albumin: 4.2 g/dL (ref 3.6–5.1)
Alkaline Phosphatase: 106 U/L (ref 40–115)
BUN: 20 mg/dL (ref 7–25)
CO2: 29 mmol/L (ref 20–31)
CREATININE: 0.96 mg/dL (ref 0.70–1.11)
Calcium: 9.6 mg/dL (ref 8.6–10.3)
Chloride: 102 mmol/L (ref 98–110)
Glucose, Bld: 84 mg/dL (ref 65–99)
Potassium: 4.2 mmol/L (ref 3.5–5.3)
SODIUM: 139 mmol/L (ref 135–146)
Total Bilirubin: 0.6 mg/dL (ref 0.2–1.2)
Total Protein: 6.7 g/dL (ref 6.1–8.1)

## 2015-03-11 LAB — CBC WITH DIFFERENTIAL/PLATELET
BASOS PCT: 0 % (ref 0–1)
Basophils Absolute: 0 10*3/uL (ref 0.0–0.1)
Eosinophils Absolute: 0.1 10*3/uL (ref 0.0–0.7)
Eosinophils Relative: 1 % (ref 0–5)
HCT: 48.2 % (ref 39.0–52.0)
Hemoglobin: 15.9 g/dL (ref 13.0–17.0)
Lymphocytes Relative: 17 % (ref 12–46)
Lymphs Abs: 1.2 10*3/uL (ref 0.7–4.0)
MCH: 29.4 pg (ref 26.0–34.0)
MCHC: 33 g/dL (ref 30.0–36.0)
MCV: 89.1 fL (ref 78.0–100.0)
MONO ABS: 0.5 10*3/uL (ref 0.1–1.0)
MONOS PCT: 8 % (ref 3–12)
MPV: 10 fL (ref 8.6–12.4)
Neutro Abs: 5 10*3/uL (ref 1.7–7.7)
Neutrophils Relative %: 74 % (ref 43–77)
PLATELETS: 179 10*3/uL (ref 150–400)
RBC: 5.41 MIL/uL (ref 4.22–5.81)
RDW: 13.8 % (ref 11.5–15.5)
WBC: 6.8 10*3/uL (ref 4.0–10.5)

## 2015-03-11 LAB — LIPID PANEL
Cholesterol: 198 mg/dL (ref 125–200)
HDL: 57 mg/dL (ref >=40)
LDL CALC: 118 mg/dL (ref <130)
Total CHOL/HDL Ratio: 3.5 Ratio (ref <=5.0)
Triglycerides: 117 mg/dL (ref <150)
VLDL: 23 mg/dL (ref <30)

## 2015-03-23 ENCOUNTER — Ambulatory Visit (HOSPITAL_COMMUNITY): Payer: Medicare Other

## 2015-03-31 ENCOUNTER — Other Ambulatory Visit: Payer: Self-pay

## 2015-03-31 ENCOUNTER — Telehealth: Payer: Self-pay | Admitting: Family Medicine

## 2015-03-31 DIAGNOSIS — Z9109 Other allergy status, other than to drugs and biological substances: Secondary | ICD-10-CM

## 2015-03-31 MED ORDER — FLUTICASONE PROPIONATE 50 MCG/ACT NA SUSP: 2.0000 | Freq: Every day | NASAL | Status: DC

## 2015-03-31 NOTE — Telephone Encounter (Signed)
Pt called and was wanting a refill for his flonase nose spray pt uses [facility],

## 2015-04-06 ENCOUNTER — Ambulatory Visit (INDEPENDENT_AMBULATORY_CARE_PROVIDER_SITE_OTHER): Payer: Medicare Other | Admitting: Ophthalmology

## 2015-04-06 DIAGNOSIS — D3131 Benign neoplasm of right choroid: Secondary | ICD-10-CM | POA: Diagnosis not present

## 2015-04-06 DIAGNOSIS — I10 Essential (primary) hypertension: Secondary | ICD-10-CM | POA: Diagnosis not present

## 2015-04-06 DIAGNOSIS — H35033 Hypertensive retinopathy, bilateral: Secondary | ICD-10-CM

## 2015-04-06 DIAGNOSIS — H43813 Vitreous degeneration, bilateral: Secondary | ICD-10-CM | POA: Diagnosis not present

## 2015-04-10 ENCOUNTER — Telehealth: Payer: Self-pay

## 2015-04-10 NOTE — Telephone Encounter (Signed)
PATIENT LEFT A MESSAGE THAT HE HAD AN AORTA US SCHEDULED HE CANCELED THE APPOINTMENT DUE TO SOME TEETH ISSUES BEFORE HE RESCHEDULES HE WANTS YOU TO CALL HIM AND DISCUSS THIS WITH YOU [phone]

## 2015-04-27 ENCOUNTER — Ambulatory Visit (HOSPITAL_COMMUNITY)
Admission: RE | Admit: 2015-04-27 | Discharge: 2015-04-27 | Disposition: A | Discharge status: Home / Self Care | Payer: Medicare Other | Source: Ambulatory Visit | Attending: Family Medicine | Admitting: Family Medicine

## 2015-04-27 DIAGNOSIS — F17200 Nicotine dependence, unspecified, uncomplicated: Secondary | ICD-10-CM | POA: Insufficient documentation

## 2015-04-27 DIAGNOSIS — Z136 Encounter for screening for cardiovascular disorders: Secondary | ICD-10-CM | POA: Diagnosis not present

## 2015-04-27 DIAGNOSIS — F17210 Nicotine dependence, cigarettes, uncomplicated: Secondary | ICD-10-CM | POA: Diagnosis not present

## 2015-04-27 DIAGNOSIS — Z0000 Encounter for general adult medical examination without abnormal findings: Secondary | ICD-10-CM

## 2015-06-04 DIAGNOSIS — M4306 Spondylolysis, lumbar region: Secondary | ICD-10-CM | POA: Diagnosis not present

## 2015-06-04 DIAGNOSIS — M6281 Muscle weakness (generalized): Secondary | ICD-10-CM | POA: Diagnosis not present

## 2015-06-04 DIAGNOSIS — M419 Scoliosis, unspecified: Secondary | ICD-10-CM | POA: Diagnosis not present

## 2015-06-04 DIAGNOSIS — M4134 Thoracogenic scoliosis, thoracic region: Secondary | ICD-10-CM | POA: Diagnosis not present

## 2015-07-15 ENCOUNTER — Telehealth: Payer: Self-pay | Admitting: Family Medicine

## 2015-07-15 NOTE — Telephone Encounter (Signed)
Have him treat his symptoms and if he needs something for the vomiting with me know

## 2015-07-15 NOTE — Telephone Encounter (Signed)
She can use the Imodium. He can eat anything he is comfortable eating. He can start back on his meds once his stomach is quieted down

## 2015-07-15 NOTE — Telephone Encounter (Signed)
1. He is over the vomiting and diarrhea but his wife has the same thing now and he wants to know if its ok for her to take Imodium right now? 2. What do you recommend eating when you have this? 3. When can he start back on his meds?

## 2015-07-15 NOTE — Telephone Encounter (Signed)
Pt started with stomach bug at 4:00 am and it has been constant since then. He has been throwing up, diarrhea and now just throwing up bile so pt does not think he can come in for an appointment. He wants to know what he can do to help

## 2015-07-15 NOTE — Telephone Encounter (Signed)
Pt informed

## 2015-07-28 ENCOUNTER — Other Ambulatory Visit: Payer: Self-pay | Admitting: Family Medicine

## 2015-07-29 ENCOUNTER — Telehealth: Payer: Self-pay | Admitting: Family Medicine

## 2015-07-29 MED ORDER — OMEPRAZOLE 20 MG PO CPDR: 20.0000 mg | DELAYED_RELEASE_CAPSULE | Freq: Two times a day (BID) | ORAL | Status: DC

## 2015-07-29 NOTE — Telephone Encounter (Signed)
Called in to [facility]. 

## 2015-07-29 NOTE — Telephone Encounter (Signed)
Pt called for refill of omeprazole. Please send to [facility]. Pt can be reached at [phone].

## 2015-09-03 DIAGNOSIS — M545 Low back pain: Secondary | ICD-10-CM | POA: Diagnosis not present

## 2015-09-03 DIAGNOSIS — M47816 Spondylosis without myelopathy or radiculopathy, lumbar region: Secondary | ICD-10-CM | POA: Diagnosis not present

## 2015-09-03 DIAGNOSIS — M419 Scoliosis, unspecified: Secondary | ICD-10-CM | POA: Diagnosis not present

## 2015-09-08 ENCOUNTER — Other Ambulatory Visit: Payer: Self-pay | Admitting: Family Medicine

## 2015-09-09 DIAGNOSIS — Z961 Presence of intraocular lens: Secondary | ICD-10-CM | POA: Diagnosis not present

## 2015-09-09 DIAGNOSIS — H40013 Open angle with borderline findings, low risk, bilateral: Secondary | ICD-10-CM | POA: Diagnosis not present

## 2015-09-09 DIAGNOSIS — H02403 Unspecified ptosis of bilateral eyelids: Secondary | ICD-10-CM | POA: Diagnosis not present

## 2015-09-09 DIAGNOSIS — H35033 Hypertensive retinopathy, bilateral: Secondary | ICD-10-CM | POA: Diagnosis not present

## 2015-10-27 ENCOUNTER — Other Ambulatory Visit: Payer: Self-pay | Admitting: Family Medicine

## 2015-11-03 DIAGNOSIS — M1711 Unilateral primary osteoarthritis, right knee: Secondary | ICD-10-CM | POA: Diagnosis not present

## 2015-11-16 ENCOUNTER — Telehealth: Payer: Self-pay | Admitting: Family Medicine

## 2015-11-16 NOTE — Telephone Encounter (Signed)
Pt has appointment 

## 2015-11-16 NOTE — Telephone Encounter (Signed)
Pt called and states that he feels like his heart beat is getting slower,  And that he is getting tired easier. And wants your opinion and wants to know if you think he should come in for a appt. Pt can be reached at [phone]

## 2015-11-16 NOTE — Telephone Encounter (Signed)
Have him set up an appointment whenever it is convenient

## 2015-11-17 ENCOUNTER — Ambulatory Visit (INDEPENDENT_AMBULATORY_CARE_PROVIDER_SITE_OTHER): Payer: Medicare Other | Admitting: Family Medicine

## 2015-11-17 ENCOUNTER — Encounter: Payer: Self-pay | Admitting: Family Medicine

## 2015-11-17 VITALS — BP 128/70 | HR 62

## 2015-11-17 DIAGNOSIS — R5383 Other fatigue: Secondary | ICD-10-CM | POA: Diagnosis not present

## 2015-11-17 DIAGNOSIS — R001 Bradycardia, unspecified: Secondary | ICD-10-CM

## 2015-11-17 DIAGNOSIS — I498 Other specified cardiac arrhythmias: Secondary | ICD-10-CM | POA: Diagnosis not present

## 2015-11-17 DIAGNOSIS — E785 Hyperlipidemia, unspecified: Secondary | ICD-10-CM | POA: Diagnosis not present

## 2015-11-17 LAB — COMPREHENSIVE METABOLIC PANEL
ALBUMIN: 4.6 g/dL (ref 3.6–5.1)
ALT: 24 U/L (ref 9–46)
AST: 22 U/L (ref 10–35)
Alkaline Phosphatase: 108 U/L (ref 40–115)
BILIRUBIN TOTAL: 0.5 mg/dL (ref 0.2–1.2)
BUN: 18 mg/dL (ref 7–25)
CHLORIDE: 102 mmol/L (ref 98–110)
CO2: 26 mmol/L (ref 20–31)
CREATININE: 1.04 mg/dL (ref 0.70–1.11)
Calcium: 9.6 mg/dL (ref 8.6–10.3)
Glucose, Bld: 83 mg/dL (ref 65–99)
Potassium: 4.3 mmol/L (ref 3.5–5.3)
SODIUM: 140 mmol/L (ref 135–146)
TOTAL PROTEIN: 7.2 g/dL (ref 6.1–8.1)

## 2015-11-17 LAB — CBC WITH DIFFERENTIAL/PLATELET
BASOS ABS: 0 {cells}/uL (ref 0–200)
Basophils Relative: 0 %
EOS PCT: 1 %
Eosinophils Absolute: 95 cells/uL (ref 15–500)
HCT: 49.5 % (ref 38.5–50.0)
Hemoglobin: 16.6 g/dL (ref 13.2–17.1)
Lymphocytes Relative: 12 %
Lymphs Abs: 1140 cells/uL (ref 850–3900)
MCH: 30 pg (ref 27.0–33.0)
MCHC: 33.5 g/dL (ref 32.0–36.0)
MCV: 89.4 fL (ref 80.0–100.0)
MPV: 10 fL (ref 7.5–12.5)
Monocytes Absolute: 665 cells/uL (ref 200–950)
Monocytes Relative: 7 %
NEUTROS ABS: 7600 {cells}/uL (ref 1500–7800)
Neutrophils Relative %: 80 %
PLATELETS: 165 10*3/uL (ref 140–400)
RBC: 5.54 MIL/uL (ref 4.20–5.80)
RDW: 13.5 % (ref 11.0–15.0)
WBC: 9.5 10*3/uL (ref 4.0–10.5)

## 2015-11-17 LAB — LIPID PANEL
CHOLESTEROL: 221 mg/dL — AB (ref 125–200)
HDL: 61 mg/dL (ref >=40)
LDL Cholesterol: 112 mg/dL (ref <130)
TRIGLYCERIDES: 239 mg/dL — AB (ref <150)
Total CHOL/HDL Ratio: 3.6 Ratio (ref <=5.0)
VLDL: 48 mg/dL — AB (ref <30)

## 2015-11-17 NOTE — Progress Notes (Signed)
   Subjective:    Patient ID: [name], male    DOB: 1931-12-04, 82 y.o.   MRN: [id number]  HPI He is here for a consultation concerning a 6 month history of decreased stamina. He says that now with any physical activity it takes him much longer to recuperate. He has had no chest pain, shortness of breath, rapid heart rate, PND or dizziness. He actually notes a slow heart rate. Review the record indicates that he has had a bradycardia for quite some time. He also has a previous history of echo proven aortic regurg as well as mitral regurg. This test was done in August 2016. He also has a previous EKG that did show evidence of a junctional rhythm.Apparently he was also being considered for a pacemaker  Review of Systems     Objective:   Physical Exam Alert and in no distress. Cardiac exam shows a bradycardia with a very mild diastolic murmur heard best along the left sternal border and a mild systolic murmur heard in the nipple area. Lungs are clear to auscultation. EKG shows a nodal rhythm which was the same as on his last EKG.       Assessment & Plan:  Decreased stamina - Plan: EKG 12-Lead, CBC with Differential/Platelet, Comprehensive metabolic panel, Lipid panel, Echocardiogram  Bradycardia - Plan: EKG 12-Lead, CBC with Differential/Platelet, Comprehensive metabolic panel, Lipid panel, Echocardiogram  Difficult to say whether this is a major change or not. I will get another echocardiogram to see if there is any change and also do blood work.

## 2015-11-18 ENCOUNTER — Telehealth: Payer: Self-pay

## 2015-11-18 NOTE — Telephone Encounter (Signed)
Patient informed Echo 11/26/15 at 9 am at [facility]  I did call his ins. They said no prior [name] is needed they gave me ref# [id number] to prove I called and asked

## 2015-11-26 ENCOUNTER — Other Ambulatory Visit (HOSPITAL_COMMUNITY): Payer: Medicare Other

## 2015-11-30 ENCOUNTER — Ambulatory Visit (HOSPITAL_COMMUNITY)
Admission: RE | Admit: 2015-11-30 | Discharge: 2015-11-30 | Disposition: A | Discharge status: Home / Self Care | Payer: Medicare Other | Source: Ambulatory Visit | Attending: Family Medicine | Admitting: Family Medicine

## 2015-11-30 DIAGNOSIS — I351 Nonrheumatic aortic (valve) insufficiency: Secondary | ICD-10-CM | POA: Insufficient documentation

## 2015-11-30 DIAGNOSIS — E785 Hyperlipidemia, unspecified: Secondary | ICD-10-CM | POA: Diagnosis not present

## 2015-11-30 DIAGNOSIS — I340 Nonrheumatic mitral (valve) insufficiency: Secondary | ICD-10-CM | POA: Insufficient documentation

## 2015-11-30 DIAGNOSIS — R5383 Other fatigue: Secondary | ICD-10-CM

## 2015-11-30 DIAGNOSIS — R001 Bradycardia, unspecified: Secondary | ICD-10-CM | POA: Diagnosis not present

## 2015-11-30 DIAGNOSIS — R011 Cardiac murmur, unspecified: Secondary | ICD-10-CM | POA: Diagnosis present

## 2015-11-30 DIAGNOSIS — I10 Essential (primary) hypertension: Secondary | ICD-10-CM | POA: Diagnosis not present

## 2015-11-30 NOTE — Progress Notes (Signed)
  Echocardiogram 2D Echocardiogram has been performed.  Jennette Dubin 11/30/2015, 9:54 AM

## 2015-12-15 ENCOUNTER — Other Ambulatory Visit: Payer: Self-pay

## 2015-12-15 ENCOUNTER — Telehealth: Payer: Self-pay | Admitting: Family Medicine

## 2015-12-15 MED ORDER — AMLODIPINE BESYLATE 5 MG PO TABS: ORAL_TABLET | ORAL | Status: DC

## 2015-12-15 NOTE — Telephone Encounter (Signed)
Who ever sent this in on 10/27/15 didn't update directions so it did not go but resent today

## 2015-12-15 NOTE — Telephone Encounter (Signed)
Pt called and states that needs a refill on his Norvasc pt states that he only has 5 left. Pt states that he has not got any since feb. Pt uses [facility] he states that he never got a rx in the mail.

## 2016-02-02 ENCOUNTER — Other Ambulatory Visit: Payer: Self-pay | Admitting: Family Medicine

## 2016-02-03 DIAGNOSIS — M1711 Unilateral primary osteoarthritis, right knee: Secondary | ICD-10-CM | POA: Diagnosis not present

## 2016-02-17 ENCOUNTER — Other Ambulatory Visit: Payer: Self-pay | Admitting: Family Medicine

## 2016-02-19 ENCOUNTER — Telehealth: Payer: Self-pay | Admitting: Family Medicine

## 2016-02-19 NOTE — Telephone Encounter (Signed)
[name] notified. [name]

## 2016-02-19 NOTE — Telephone Encounter (Signed)
Pt is thinking about having knee replacement surgery with Dr Erlinda Hong at [facility]. Pt wants to know Dr Lanice Shirts opinion on Dr Erlinda Hong in general and on doing knee replacements

## 2016-02-19 NOTE — Telephone Encounter (Signed)
I don't know him that well but the group he is in, I know very well so I have no problem with him having the surgery done by them

## 2016-02-22 DIAGNOSIS — M1712 Unilateral primary osteoarthritis, left knee: Secondary | ICD-10-CM | POA: Diagnosis not present

## 2016-02-22 DIAGNOSIS — M1711 Unilateral primary osteoarthritis, right knee: Secondary | ICD-10-CM | POA: Diagnosis not present

## 2016-03-09 ENCOUNTER — Encounter: Payer: Self-pay | Admitting: Family Medicine

## 2016-03-09 ENCOUNTER — Ambulatory Visit (INDEPENDENT_AMBULATORY_CARE_PROVIDER_SITE_OTHER): Payer: Medicare Other | Admitting: Family Medicine

## 2016-03-09 VITALS — BP 122/78 | HR 64 | Temp 97.4°F | Wt 159.6 lb

## 2016-03-09 DIAGNOSIS — R1310 Dysphagia, unspecified: Secondary | ICD-10-CM | POA: Diagnosis not present

## 2016-03-09 DIAGNOSIS — M1990 Unspecified osteoarthritis, unspecified site: Secondary | ICD-10-CM

## 2016-03-09 NOTE — Progress Notes (Signed)
   Subjective:    Patient ID: [name], male    DOB: May 10, 1932, 82 y.o.   MRN: [id number]  HPI He states that he woke up 4 days ago and noted right sided pain with swallowing. No fever, chills, sore throat. He does have underlying allergies. He also has been having difficulty with arthritis mainly of the right knee and apparently is scheduled for knee replacement.   Review of Systems     Objective:   Physical Exam Alert and in no distress. TMs are normal. Throat does show a raw erythematous area on the anterior tonsillar pillar. Neck is supple without any adenopathy.       Assessment & Plan:  Arthritis  Odynophagia  I will look at the report from orthopedics. Discussed the possibility of him getting a partial knee replacement as opposed to a full 1. I will discuss that with him after I reviewed the report. Also the area on the right tonsil does appear to be raw and irritated however I will wait a couple weeks and reevaluate. If he continues have difficulty will probably refer to ENT for possible biopsy

## 2016-03-14 ENCOUNTER — Ambulatory Visit (INDEPENDENT_AMBULATORY_CARE_PROVIDER_SITE_OTHER): Payer: Medicare Other | Admitting: Family Medicine

## 2016-03-14 ENCOUNTER — Encounter: Payer: Self-pay | Admitting: Family Medicine

## 2016-03-14 VITALS — BP 130/80 | HR 60 | Ht 67.0 in | Wt 157.0 lb

## 2016-03-14 DIAGNOSIS — E785 Hyperlipidemia, unspecified: Secondary | ICD-10-CM

## 2016-03-14 DIAGNOSIS — K449 Diaphragmatic hernia without obstruction or gangrene: Secondary | ICD-10-CM

## 2016-03-14 DIAGNOSIS — I471 Supraventricular tachycardia: Secondary | ICD-10-CM

## 2016-03-14 DIAGNOSIS — R001 Bradycardia, unspecified: Secondary | ICD-10-CM

## 2016-03-14 DIAGNOSIS — I10 Essential (primary) hypertension: Secondary | ICD-10-CM | POA: Diagnosis not present

## 2016-03-14 DIAGNOSIS — K219 Gastro-esophageal reflux disease without esophagitis: Secondary | ICD-10-CM

## 2016-03-14 DIAGNOSIS — Z8546 Personal history of malignant neoplasm of prostate: Secondary | ICD-10-CM | POA: Diagnosis not present

## 2016-03-14 DIAGNOSIS — J3089 Other allergic rhinitis: Secondary | ICD-10-CM | POA: Diagnosis not present

## 2016-03-14 DIAGNOSIS — H259 Unspecified age-related cataract: Secondary | ICD-10-CM | POA: Diagnosis not present

## 2016-03-14 DIAGNOSIS — Z0000 Encounter for general adult medical examination without abnormal findings: Secondary | ICD-10-CM

## 2016-03-14 DIAGNOSIS — M1990 Unspecified osteoarthritis, unspecified site: Secondary | ICD-10-CM

## 2016-03-14 NOTE — Progress Notes (Signed)
Subjective:   HPI  [name] is a 82 y.o. male who presents for a complete physical.  Medical care team includes:  Dr.Magod GI  Dr.Taylor cardio.  [id number] eyes  Dr.Heacker opth.   Preventative care: Last ophthalmology visit:2016 goes once a year Last dental visit:8 months ago Last colonoscopy:07/25/2005 Last prostate exam: dont have one per pt Last EKG:11/17/15 Last labs:11/17/15  Prior vaccinations: TD or Tdap:01/03/12 Influenza: Pneumococcal:23: 01/03/12 13: 01/20/14 Shingles/Zostavax: Other:   Advanced directive:Yes  Concerns: He states that he is on his reflux medicines on a regular basis. He notes a when he changed positions the pain he was having in his throat and neck area got much better. He does have underlying arthritis especially of the right knee and is using Tylenol as well as ibuprofen but only once or twice per week with the ibuprofen. He is tentatively scheduled for knee replacement in another month. He does have GERD and presently uses Prilosec on a daily basis for control of this. He continues on pravastatin. His allergies are under good control on his present medication regimen. He is over 10 years since his prostate cancer diagnosis. He does have a previous history of SVT but has not had any recent difficulty. He does have cataracts in both eyes.   Reviewed their medical, surgical, family, social, medication, and allergy history and updated chart as appropriate.    Review of Systems Constitutional: -fever, -chills, -sweats, -unexpected weight change, -decreased appetite, -fatigue Allergy: -sneezing, -itching, -congestion Dermatology: -changing moles, --rash, -lumps ENT: -runny nose, -ear pain, -sore throat, -hoarseness, -sinus pain, -teeth pain, - ringing in ears, -hearing loss, -nosebleeds Cardiology: -chest pain, -palpitations, -swelling, -difficulty breathing when lying flat, -waking up short of breath Respiratory: -cough, -shortness of breath,  -difficulty breathing with exercise or exertion, -wheezing, -coughing up blood Gastroenterology: -abdominal pain, -nausea, -vomiting, -diarrhea, -constipation, -blood in stool, -changes in bowel movement, -difficulty swallowing or eating Hematology: -bleeding, -bruising  Musculoskeletal: -joint aches, -muscle aches, -joint swelling, -back pain, -neck pain, -cramping, -changes in gait Ophthalmology: denies vision changes, eye redness, itching, discharge Urology: -burning with urination, -difficulty urinating, -blood in urine, -urinary frequency, -urgency, -incontinence Neurology: -headache, -weakness, -tingling, -numbness, -memory loss, -falls, -dizziness Psychology: -depressed mood, -agitation, -sleep problems     Objective:   Physical Exam   General appearance: alert, no distress, WD/WN,  Skin: Normal  HEENT: normocephalic, conjunctiva/corneas normal, sclerae anicteric, PERRLA, EOMi, nares patent, no discharge or erythema, pharynx normal Oral cavity: MMM, tongue normal, teeth normal Neck: supple, no lymphadenopathy, no thyromegaly, no masses, normal ROM Chest: non tender, normal shape and expansion Heart: RRR, normal S1, S2, no murmurs Lungs: CTA bilaterally, no wheezes, rhonchi, or rales Abdomen: +bs, soft, non tender, non distended, no masses, no hepatomegaly, no splenomegaly, no bruits  Musculoskeletal: upper extremities non tender, no obvious deformity, normal ROM throughout, lower extremities non tender, no obvious deformity, normal ROM throughout Extremities: no edema, no cyanosis, no clubbing Pulses: 2+ symmetric, upper and lower extremities, normal cap refill Neurological: alert, oriented x 3, CN2-12 intact, strength normal upper extremities and lower extremities, sensation normal throughout, DTRs 2+ throughout, no cerebellar signs, gait normal Psychiatric: normal affect, behavior normal, pleasant     Assessment and Plan :   HH (hiatus hernia)  Other allergic  rhinitis  Essential hypertension  Arthritis  Hyperlipidemia LDL goal <130  Bradycardia  Senile cataracts of both eyes  History of prostate cancer  SVT (supraventricular tachycardia) (HCC)  Gastroesophageal reflux disease, esophagitis presence not specified  Physical exam - discussed healthy lifestyle, diet, exercise, preventative care, vaccinations, and addressed their concerns.   Overall he is doing quite well. I will follow-up with him concerning the knee surgery. Belchy of him getting a partial replacement. Follow-up PRN

## 2016-03-14 NOTE — Patient Instructions (Signed)
  [name] , Thank you for taking time to come for your Medicare Wellness Visit. I appreciate your ongoing commitment to your health goals. Please review the following plan we discussed and let me know if I can assist you in the future.   These are the goals we discussed: Goals    None      This is a list of the screening recommended for you and due dates:  Health Maintenance  Topic Date Due  . Shingles Vaccine  09/03/1993  . Flu Shot  02/23/2016  . Tetanus Vaccine  01/02/2022  . Pneumonia vaccines  Completed

## 2016-03-22 ENCOUNTER — Telehealth: Payer: Self-pay | Admitting: Family Medicine

## 2016-03-22 NOTE — Telephone Encounter (Signed)
Pt wants you to call him and discuss the Xrays and what you recommend

## 2016-03-24 ENCOUNTER — Telehealth: Payer: Self-pay | Admitting: Family Medicine

## 2016-03-24 NOTE — Telephone Encounter (Signed)
Pt called and states that the pain in the knee was in the inside of the knee and not in the outside and wanted to let you know that he was sorry if he confused you, he states that you sent him for X-rays, said if you needed to call him that you could [phone]

## 2016-04-04 DIAGNOSIS — M1711 Unilateral primary osteoarthritis, right knee: Secondary | ICD-10-CM | POA: Diagnosis not present

## 2016-04-06 ENCOUNTER — Other Ambulatory Visit: Payer: Self-pay | Admitting: Family Medicine

## 2016-04-11 ENCOUNTER — Encounter: Payer: Self-pay | Admitting: Family Medicine

## 2016-04-11 ENCOUNTER — Ambulatory Visit (INDEPENDENT_AMBULATORY_CARE_PROVIDER_SITE_OTHER): Payer: Medicare Other | Admitting: Family Medicine

## 2016-04-11 VITALS — BP 120/84 | HR 68 | Temp 98.3°F | Ht 67.0 in | Wt 158.6 lb

## 2016-04-11 DIAGNOSIS — L739 Follicular disorder, unspecified: Secondary | ICD-10-CM | POA: Diagnosis not present

## 2016-04-11 DIAGNOSIS — L259 Unspecified contact dermatitis, unspecified cause: Secondary | ICD-10-CM | POA: Diagnosis not present

## 2016-04-11 MED ORDER — DOXYCYCLINE HYCLATE 100 MG PO TABS
100.0000 mg | ORAL_TABLET | Freq: Two times a day (BID) | ORAL | 0 refills | Status: DC
Start: 2016-04-11 — End: 2016-07-26

## 2016-04-11 NOTE — Progress Notes (Signed)
Chief Complaint  Patient presents with  . Rash    on left thigh x 7 days. Unsure of whether or not this is a bug bite or rash. Started with an itching sensation. Not painful-has used hydrocortisone cream. Not sure if it's a spider bite?  Marland Kitchen Flu Vaccine    declined.    Over a week ago he started itching at his left hip, thigh, in a wide area. He then noticed a rash starting on the front of the right thigh.  The rash is itchy, has never been painful.  Plays golf, in the yard--no known new exposures. No known bites, ticks, no new soaps, detergents, creams.    He has been using antibiotic cream x 1-2 days, didn't notice a difference so then used cortisone cream.  That helped with the itching, but the rash itself hasn't changed at all.  Using 1% HC cream. He has h/o eczema, hasn't tried the prescription cream he has, just the OTC. He has developed an irritation to the tape that was used to keep the gauze in place.   PMH, PSH, SH reviewed.  Outpatient Encounter Prescriptions as of 04/11/2016  Medication Sig  . amLODipine (NORVASC) 5 MG tablet Take 1 tablet by mouth  daily  . aspirin 81 MG tablet Take 81 mg by mouth 2 (two) times daily.   . cetirizine (ZYRTEC) 10 MG tablet Take 10 mg by mouth daily.  Marland Kitchen desonide (DESOWEN) 0.05 % cream Apply two times daily  topically  . fish oil-omega-3 fatty acids 1000 MG capsule Take 1,200 mg by mouth daily.  . fluticasone (FLONASE) 50 MCG/ACT nasal spray Use 2 sprays in each  nostril daily  . glucosamine-chondroitin 500-400 MG tablet Take 1 tablet by mouth 3 (three) times daily.    . Multiple Vitamins-Minerals (MULTIVITAMIN WITH MINERALS) tablet Take 1 tablet by mouth daily.    Marland Kitchen omeprazole (PRILOSEC) 20 MG capsule Take 1 capsule by mouth  twice daily before a meal  . pravastatin (PRAVACHOL) 40 MG tablet Take 1 tablet by mouth  daily   No facility-administered encounter medications on file as of 04/11/2016.    Allergies  Allergen Reactions  . Codeine     ROS: no fever, chills, myalgias, arthralgias, headache or other rash, except as noted in HPI. No URI symptoms, shortness of breath, chest pain, edema, bleeding, bruising, or other concerns.  PHYSICAL EXAM:  BP 120/84 (BP Location: Left Arm, Patient Position: Sitting, Cuff Size: Normal)   Pulse 68   Temp 98.3 F (36.8 C) (Tympanic)   Ht 5\' 7"  (1.702 m)   Wt 158 lb 9.6 oz (71.9 kg)   BMI 24.84 kg/m   Pleasant, well-appearing male in no distress  Skin: Left upper anterior thigh: Area of erythema (with many red lesions clustered together) measuring 2cm, but there is a larger, 5cm area of cluster of erythematous macules which appear to be inflamed follicles.  There is no induration, crusting, drainage, tenderness.  At the four corners of where gauze had been taped there are 4 areas of mild erythematous patches (from tape irritation/reaction).  ASSESSMENT/PLAN:  Contact dermatitis - possible, versus insect bites initially  Folliculitis - start doxycycline if not improving with steroid treatments - Plan: doxycycline (VIBRA-TABS) 100 MG tablet    Use the desonide that you have at home to the affected area of skin on your right thigh 2-3 times/day for up to a week.  This is a little stronger than the over-the-counter cortisone cream you have tried.  If it is very itchy, feel free to use antihistamines (zyrtec, claritin, allegra, benadryl) to help with the itching (especially if you find yourself scratching while sleeping.  You can also use topical medications for the itching, like calamine lotions, etc.  If the redness increases or spreads, please start the antibiotic. If taking the antibiotic, take it for the full 10 day course. Be sure to use sunscreen carefully when on the antibiotic.  Follow up if persistent/worsening despite theses measures.

## 2016-04-11 NOTE — Patient Instructions (Signed)
  Use the desonide that you have at home to the affected area of skin on your right thigh 2-3 times/day for up to a week.  This is a little stronger than the over-the-counter cortisone cream you have tried. If it is very itchy, feel free to use antihistamines (zyrtec, claritin, allegra, benadryl) to help with the itching (especially if you find yourself scratching while sleeping.  You can also use topical medications for the itching, like calamine lotions, etc.  If the redness increases or spreads, please start the antibiotic. If taking the antibiotic, take it for the full 10 day course. Be sure to use sunscreen carefully when on the antibiotic.  Follow up if persistent/worsening despite theses measures.

## 2016-04-12 ENCOUNTER — Ambulatory Visit (INDEPENDENT_AMBULATORY_CARE_PROVIDER_SITE_OTHER): Payer: Medicare Other | Admitting: Ophthalmology

## 2016-04-12 DIAGNOSIS — H43813 Vitreous degeneration, bilateral: Secondary | ICD-10-CM

## 2016-04-12 DIAGNOSIS — I10 Essential (primary) hypertension: Secondary | ICD-10-CM

## 2016-04-12 DIAGNOSIS — H35033 Hypertensive retinopathy, bilateral: Secondary | ICD-10-CM

## 2016-04-12 DIAGNOSIS — D3131 Benign neoplasm of right choroid: Secondary | ICD-10-CM | POA: Diagnosis not present

## 2016-04-30 ENCOUNTER — Other Ambulatory Visit: Payer: Self-pay | Admitting: Orthopaedic Surgery

## 2016-05-02 ENCOUNTER — Ambulatory Visit (INDEPENDENT_AMBULATORY_CARE_PROVIDER_SITE_OTHER): Payer: Medicare Other | Admitting: Physical Medicine and Rehabilitation

## 2016-05-02 ENCOUNTER — Ambulatory Visit (INDEPENDENT_AMBULATORY_CARE_PROVIDER_SITE_OTHER): Payer: Medicare Other | Admitting: Orthopaedic Surgery

## 2016-05-02 DIAGNOSIS — M25552 Pain in left hip: Secondary | ICD-10-CM

## 2016-05-02 DIAGNOSIS — M1612 Unilateral primary osteoarthritis, left hip: Secondary | ICD-10-CM | POA: Diagnosis not present

## 2016-05-09 ENCOUNTER — Ambulatory Visit (INDEPENDENT_AMBULATORY_CARE_PROVIDER_SITE_OTHER): Payer: Medicare Other | Admitting: Family Medicine

## 2016-05-09 ENCOUNTER — Ambulatory Visit (INDEPENDENT_AMBULATORY_CARE_PROVIDER_SITE_OTHER): Payer: Medicare Other | Admitting: Orthopaedic Surgery

## 2016-05-09 VITALS — BP 120/70 | HR 58

## 2016-05-09 DIAGNOSIS — M79675 Pain in left toe(s): Secondary | ICD-10-CM | POA: Diagnosis not present

## 2016-05-09 DIAGNOSIS — G8929 Other chronic pain: Secondary | ICD-10-CM | POA: Diagnosis not present

## 2016-05-09 DIAGNOSIS — L600 Ingrowing nail: Secondary | ICD-10-CM | POA: Diagnosis not present

## 2016-05-09 DIAGNOSIS — M25552 Pain in left hip: Secondary | ICD-10-CM | POA: Diagnosis not present

## 2016-05-09 NOTE — Progress Notes (Signed)
   Subjective:    Patient ID: [name], male    DOB: [birth year], 82 y.o.   MRN: [id number]  HPI He was scheduled for right knee replacement however this was postponed due to an infection in his great toe. He presently is on antibiotics for this. He is also concerned about onychomycosis. He also has had some difficulty with left fourth toe pain. He has been putting toilet paper in between the third and fourth toes to help with that.   Review of Systems     Objective:   Physical Exam Her tendon no distress. The right great toe does show slight erythema at the base of the toe but no granulation tissue is noted. The toenail is slightly thickened and discolored. Left foot exam does show a medially deviating fourth toe however there is no evidence of trauma to the third toe.       Assessment & Plan:  Ingrown right big toenail  Toe pain, chronic, left Commended continuing on the antibiotic and he will schedule his surgery for the beginning of the year. Discussed using a spacer between the toes on the left to help take away the discomfort. Discussed possible surgery but recommended using a spacer and if that takes care of the problemor surgical intervention would be needed.

## 2016-05-09 NOTE — Patient Instructions (Signed)
Tell your wife " yes Dear, it's a good idea"

## 2016-05-10 ENCOUNTER — Encounter: Payer: Self-pay | Admitting: Family Medicine

## 2016-05-11 ENCOUNTER — Encounter (HOSPITAL_COMMUNITY): Payer: Self-pay | Admitting: Vascular Surgery

## 2016-05-11 NOTE — Progress Notes (Signed)
Anesthesia Chart Review: Patient is a 82 year old male scheduled for right TKA. Surgery was scheduled for 05/12/16 by Dr. Erlinda Hong, but surgery is being postponed due to a great toe infection.   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   PCP is Dr. Jill Alexanders. EP cardiologist is Dr. [name], last visit with Chanetta Marshall, NP on 02/23/15. At that time PRN follow-up was recommended unless he developed symptoms of dizziness, presyncope, syncope that might indicate worsening cardiac conduction.   11/17/15 EKG: Unconfirmed report is read as: Junctional rhythm, LAD, septal infarct (age undetermined). However, in review of his records and prior EKGs, I think it is actually SR with high grade first degree AV block (V3 and V4 show best, otherwise most of p waves seems to fall just after T wave) with PR interval of 640 ms.  11/30/15 Echo: Study Conclusions - Left ventricle: The cavity size was normal. Wall thickness was   normal. Systolic function was normal. The estimated ejection   fraction was in the range of 55% to 60%. - Aortic valve: There was mild regurgitation. - Mitral valve: There was mild regurgitation.  05/28/13 ETT:  ETT Interpretation:  normal - no evidence of ischemia by ST analysis Comments: Mild chronotropic incompetence but no high grade heart block Recommendations: Watchful waiting  04/27/15 Abdominal aortic U/S: IMPRESSION: No evidence of aortic aneurysm.  Unclear at this point when surgery will be rescheduled, but I did go ahead and review  known information with anesthesiologist Dr. Maryland Pink. Patient has an impressive first degree AVB that has been discussed going back to after his ablation in 2002. He has had several exercise tolerance tests, last being within the past three years, that did not show any high degree heart block and continued watchful waiting recommended. According to EP notes, PPM is not anticipated unless he becomes symptomatic (ie, dizziness, syncope, presyncope). Last year PRN EP follow-up was recommend unless he does develop new symptoms. Since EKG findings are chronic for > 10 years, and most recently he has been asymptomatic then it is anticipated that he could eventually proceed with surgery once infection is cleared. May want to consider pacing pads intraoperatively.   George Hugh [facility] Stay [facility]/Anesthesiology Phone [phone] 05/11/2016 3:27 PM

## 2016-05-11 NOTE — Progress Notes (Signed)
Pt stated that he had an office visit with surgeon this week and surgery was canceled. LVM with Sherrie to make aware.

## 2016-05-12 ENCOUNTER — Encounter (HOSPITAL_COMMUNITY): Admission: RE | Payer: Self-pay | Source: Ambulatory Visit

## 2016-05-12 ENCOUNTER — Inpatient Hospital Stay (HOSPITAL_COMMUNITY): Admission: RE | Admit: 2016-05-12 | Payer: Medicare Other | Source: Ambulatory Visit | Admitting: Orthopaedic Surgery

## 2016-05-12 SURGERY — ARTHROPLASTY, KNEE, TOTAL
Anesthesia: Spinal | Site: Knee | Laterality: Right

## 2016-05-16 ENCOUNTER — Telehealth (INDEPENDENT_AMBULATORY_CARE_PROVIDER_SITE_OTHER): Payer: Self-pay | Admitting: Orthopaedic Surgery

## 2016-05-17 ENCOUNTER — Other Ambulatory Visit: Payer: Self-pay | Admitting: Family Medicine

## 2016-05-17 NOTE — Telephone Encounter (Signed)
I will be mailing him some hip exercises.

## 2016-05-26 ENCOUNTER — Ambulatory Visit (INDEPENDENT_AMBULATORY_CARE_PROVIDER_SITE_OTHER): Payer: Medicare Other | Admitting: Orthopaedic Surgery

## 2016-06-07 ENCOUNTER — Telehealth: Payer: Self-pay | Admitting: Family Medicine

## 2016-06-07 NOTE — Telephone Encounter (Signed)
He can take 800 mg 3 times per day

## 2016-06-07 NOTE — Telephone Encounter (Signed)
Pt called and stated that he is taking 2 tablets of  500 mg of Ib profen once a day. He wants to know if he can take it more than once a day. Please advise pt can be reached at [phone].

## 2016-06-07 NOTE — Telephone Encounter (Signed)
Pt informed and verbalized understaning

## 2016-06-13 ENCOUNTER — Telehealth: Payer: Self-pay | Admitting: Family Medicine

## 2016-06-13 MED ORDER — DESONIDE 0.05 % EX CREA: TOPICAL_CREAM | Freq: Two times a day (BID) | CUTANEOUS | 1 refills | Status: DC

## 2016-06-13 NOTE — Telephone Encounter (Signed)
Rcvd refill request for Alclometasone Cr from [facility]

## 2016-06-15 ENCOUNTER — Other Ambulatory Visit: Payer: Self-pay

## 2016-06-15 ENCOUNTER — Telehealth: Payer: Self-pay

## 2016-06-15 NOTE — Telephone Encounter (Signed)
This should've been taking care of the other day

## 2016-06-15 NOTE — Telephone Encounter (Signed)
Sent in 06/13/16

## 2016-06-15 NOTE — Telephone Encounter (Signed)
Fax request from [facility] for Desowen Cream. [name]

## 2016-07-26 ENCOUNTER — Encounter: Payer: Self-pay | Admitting: Family Medicine

## 2016-07-26 ENCOUNTER — Ambulatory Visit (INDEPENDENT_AMBULATORY_CARE_PROVIDER_SITE_OTHER): Payer: Medicare Other | Admitting: Family Medicine

## 2016-07-26 VITALS — BP 114/60 | HR 50 | Wt 163.0 lb

## 2016-07-26 DIAGNOSIS — M25512 Pain in left shoulder: Secondary | ICD-10-CM | POA: Diagnosis not present

## 2016-07-26 DIAGNOSIS — L299 Pruritus, unspecified: Secondary | ICD-10-CM | POA: Diagnosis not present

## 2016-07-26 NOTE — Patient Instructions (Signed)
Use cortisone cream for the itching

## 2016-07-26 NOTE — Progress Notes (Signed)
   Subjective:    Patient ID: [name], male    DOB: April 15, 1932, 83 y.o.   MRN: [id number]  HPI He complains of a 6 week history of left shoulder pain especially with external rotation and abduction. No history of injury. No popping, locking or grinding. No other joints are involved. He did get acupuncture on the shoulder recently and apparently did get some temporary relief. He also has difficulty with his right shin causing some itching and noticed some scratch marks in that area.   Review of Systems     Objective:   Physical Exam Left shoulder exam shows motion. No crepitus. Negative drop arm test. Supraspinatus testing normal. Negative sulcus sign. Some discomfort with abduction and external rotation however near's and Hawkins test is negative. Exam of the right shin does show evidence of breech and abrasions linearly in nature indicating scratches. The skin is not red hot or tender.       Assessment & Plan:  Pruritus  Acute pain of left shoulder  Recommend cortisone cream for the skin is he does seem to cause some trauma from itching. Also demonstrated proper exercises for range of motion and strengthening. Discussed the possibility of sending him to physical therapy. Follow-up as needed.

## 2016-08-02 ENCOUNTER — Ambulatory Visit (INDEPENDENT_AMBULATORY_CARE_PROVIDER_SITE_OTHER): Payer: Medicare Other | Admitting: Orthopaedic Surgery

## 2016-08-02 ENCOUNTER — Ambulatory Visit (INDEPENDENT_AMBULATORY_CARE_PROVIDER_SITE_OTHER): Payer: Medicare Other

## 2016-08-02 DIAGNOSIS — M25512 Pain in left shoulder: Secondary | ICD-10-CM | POA: Insufficient documentation

## 2016-08-02 DIAGNOSIS — M7542 Impingement syndrome of left shoulder: Secondary | ICD-10-CM

## 2016-08-02 MED ORDER — METHYLPREDNISOLONE ACETATE 40 MG/ML IJ SUSP
40.0000 mg | INTRAMUSCULAR | Status: AC | PRN
  Administered 2016-08-02: 40 mg via INTRA_ARTICULAR

## 2016-08-02 MED ORDER — PREDNISONE 10 MG (21) PO TBPK: ORAL_TABLET | ORAL | 0 refills | Status: DC

## 2016-08-02 MED ORDER — BUPIVACAINE HCL 0.5 % IJ SOLN
3.0000 mL | INTRAMUSCULAR | Status: AC | PRN
  Administered 2016-08-02: 3 mL via INTRA_ARTICULAR

## 2016-08-02 MED ORDER — LIDOCAINE HCL 1 % IJ SOLN
3.0000 mL | INTRAMUSCULAR | Status: AC | PRN
  Administered 2016-08-02: 3 mL

## 2016-08-02 NOTE — Progress Notes (Signed)
Office Visit Note   Patient: [name]           Date of Birth: 07-Nov-1931           MRN: [id number] Visit Date: 08/02/2016              Requested by: Denita Lung, MD [address] [place], [place] [address] PCP: Wyatt Haste, MD   Assessment & Plan: Visit Diagnoses:  1. Impingement syndrome of left shoulder     Plan: Impression is left shoulder bursitis. Home exercises were given. We did perform subacromial injection today  Follow-Up Instructions: Return if symptoms worsen or fail to improve.   Orders:  Orders Placed This Encounter  Procedures  . XR Shoulder Left   Meds ordered this encounter  Medications  . predniSONE (STERAPRED UNI-PAK 21 TAB) 10 MG (21) TBPK tablet    Sig: Take as directed    Dispense:  21 tablet    Refill:  0      Procedures: Large Joint Inj Date/Time: 08/02/2016 12:50 PM Performed by: Leandrew Koyanagi Authorized by: Leandrew Koyanagi   Consent Given by:  Patient Timeout: prior to procedure the correct patient, procedure, and site was verified   Location:  Shoulder Site:  L subacromial bursa Prep: patient was prepped and draped in usual sterile fashion   Needle Size:  22 G Approach:  Posterior Ultrasound Guidance: No   Fluoroscopic Guidance: No   Arthrogram: No   Medications:  3 mL lidocaine 1 %; 3 mL bupivacaine 0.5 %; 40 mg methylPREDNISolone acetate 40 MG/ML     Clinical Data: No additional findings.   Subjective: Chief Complaint  Patient presents with  . Left Shoulder - Pain    Patient comes in today with 1/2-2 month history of left shoulder pain that is constant and only when reaching backwards along the belt line. Denies any radiation of pain or injury.    Review of Systems Complete review of systems negative except for history of present illness  Objective: Vital Signs: There were no vitals taken for this visit.  Physical Exam  Ortho Exam Well-developed nourished acute distress Exam of the  left shoulder shows mildly positive impingement signs otherwise the stem is normal. Specialty Comments:  No specialty comments available.  Imaging: Xr Shoulder Left  Result Date: 08/02/2016 No acute findings    PMFS History: Patient Active Problem List   Diagnosis Date Noted  . Acute pain of left shoulder 08/02/2016  . History of prostate cancer 12/27/2010  . Arthritis 12/27/2010  . Senile cataracts of both eyes 12/27/2010  . HH (hiatus hernia)   . History of colonic polyps   . Hyperlipidemia LDL goal <130 04/21/2008  . Allergic rhinitis 04/21/2008  . COUGH 04/21/2008  . Essential hypertension 04/18/2008  . Bradycardia 04/18/2008  . SVT (supraventricular tachycardia) ([place]) 04/18/2008   Past Medical History:  Diagnosis Date  . 1st degree AV block   . Allergy   . Arthritis   . Cataracts, bilateral   . Eczema   . HH (hiatus hernia)   . History of colonic polyps   . Hypercholesterolemia   . Hypertension   . Prostate cancer ([place]) 1997  . SVT (supraventricular tachycardia) (HCC)     Family History  Problem Relation Age of Onset  . Cancer Mother   . Alcoholism Father     Past Surgical History:  Procedure Laterality Date  . AVNODE REENTRANT ABLATION    . COLONOSCOPY  2007  MAGOD  . PROSTATE SURGERY    . TESTICLE SURGERY  2010   LEFT [facility]   Social History   Occupational History  . Not on file.   Social History Main Topics  . Smoking status: Never Smoker  . Smokeless tobacco: Never Used  . Alcohol use No  . Drug use: No  . Sexual activity: Yes

## 2016-08-09 ENCOUNTER — Other Ambulatory Visit: Payer: Self-pay | Admitting: Family Medicine

## 2016-09-13 ENCOUNTER — Other Ambulatory Visit: Payer: Self-pay | Admitting: Family Medicine

## 2016-09-22 ENCOUNTER — Telehealth: Payer: Self-pay

## 2016-09-22 DIAGNOSIS — H35033 Hypertensive retinopathy, bilateral: Secondary | ICD-10-CM | POA: Diagnosis not present

## 2016-09-22 DIAGNOSIS — H348122 Central retinal vein occlusion, left eye, stable: Secondary | ICD-10-CM | POA: Diagnosis not present

## 2016-09-22 DIAGNOSIS — H40013 Open angle with borderline findings, low risk, bilateral: Secondary | ICD-10-CM | POA: Diagnosis not present

## 2016-09-22 DIAGNOSIS — H3562 Retinal hemorrhage, left eye: Secondary | ICD-10-CM | POA: Diagnosis not present

## 2016-09-22 NOTE — Telephone Encounter (Signed)
Pt states that he wants you to call him back. He had an eye exam and has a busted blood vessel. He states they told him it was about his BP. Advised pt need for OV, but he was adamant that you call him back. [name] December [phone]

## 2016-10-06 ENCOUNTER — Encounter (INDEPENDENT_AMBULATORY_CARE_PROVIDER_SITE_OTHER): Payer: Medicare Other | Admitting: Ophthalmology

## 2016-10-06 DIAGNOSIS — H43813 Vitreous degeneration, bilateral: Secondary | ICD-10-CM | POA: Diagnosis not present

## 2016-10-06 DIAGNOSIS — H35033 Hypertensive retinopathy, bilateral: Secondary | ICD-10-CM

## 2016-10-06 DIAGNOSIS — H34832 Tributary (branch) retinal vein occlusion, left eye, with macular edema: Secondary | ICD-10-CM | POA: Diagnosis not present

## 2016-10-06 DIAGNOSIS — D3131 Benign neoplasm of right choroid: Secondary | ICD-10-CM

## 2016-10-06 DIAGNOSIS — H33301 Unspecified retinal break, right eye: Secondary | ICD-10-CM | POA: Diagnosis not present

## 2016-10-06 DIAGNOSIS — I10 Essential (primary) hypertension: Secondary | ICD-10-CM

## 2016-11-07 ENCOUNTER — Encounter (INDEPENDENT_AMBULATORY_CARE_PROVIDER_SITE_OTHER): Payer: Medicare Other | Admitting: Ophthalmology

## 2016-11-07 DIAGNOSIS — H348322 Tributary (branch) retinal vein occlusion, left eye, stable: Secondary | ICD-10-CM

## 2016-11-07 DIAGNOSIS — H35033 Hypertensive retinopathy, bilateral: Secondary | ICD-10-CM

## 2016-11-07 DIAGNOSIS — I10 Essential (primary) hypertension: Secondary | ICD-10-CM

## 2016-11-07 DIAGNOSIS — H43813 Vitreous degeneration, bilateral: Secondary | ICD-10-CM

## 2016-11-07 DIAGNOSIS — H33301 Unspecified retinal break, right eye: Secondary | ICD-10-CM | POA: Diagnosis not present

## 2016-11-07 DIAGNOSIS — D3131 Benign neoplasm of right choroid: Secondary | ICD-10-CM | POA: Diagnosis not present

## 2016-11-15 ENCOUNTER — Other Ambulatory Visit: Payer: Self-pay | Admitting: Family Medicine

## 2016-12-26 ENCOUNTER — Encounter: Payer: Self-pay | Admitting: Family Medicine

## 2016-12-26 ENCOUNTER — Encounter (INDEPENDENT_AMBULATORY_CARE_PROVIDER_SITE_OTHER): Payer: Medicare Other | Admitting: Ophthalmology

## 2016-12-26 ENCOUNTER — Ambulatory Visit (INDEPENDENT_AMBULATORY_CARE_PROVIDER_SITE_OTHER): Payer: Medicare Other | Admitting: Family Medicine

## 2016-12-26 VITALS — BP 110/70 | HR 60 | Wt 160.2 lb

## 2016-12-26 DIAGNOSIS — I471 Supraventricular tachycardia: Secondary | ICD-10-CM

## 2016-12-26 DIAGNOSIS — R001 Bradycardia, unspecified: Secondary | ICD-10-CM

## 2016-12-26 NOTE — Progress Notes (Signed)
   Subjective:    Patient ID: [name], male    DOB: 1931-12-17, 83 y.o.   MRN: [id number]  HPI He is here for consult concerning low pulse rate. He does have a previous history of AR/MR as well as bradycardia. He has been seen in the past by cardiology and the recommendation of watchful waiting was given. He recently got a machine to check his pulse and on several occasions has noted the pulse in the 30s. When this occurs he would sometimes notes some fatigue but then at other times when he would check it he states that he had no symptoms at all. He does have a remote history of an ablation for SVT.   Review of Systems     Objective:   Physical Exam Alert and in no distress. Cardiac exam does show a bradycardia that was slightly irregular. The EKG shows what appears to be a possible junctional rhythm. I could not get the P waves to march out. He does have a left axis.       Assessment & Plan:  Bradycardia - Plan: EKG 12-Lead, Ambulatory referral to Cardiology  SVT (supraventricular tachycardia) ([place]) - Plan: Ambulatory referral to Cardiology I will refer him back to Dr. Lovena Le who he has seen in the past for further evaluation.

## 2016-12-29 ENCOUNTER — Telehealth: Payer: Self-pay | Admitting: Family Medicine

## 2016-12-29 NOTE — Telephone Encounter (Signed)
Pt states called & Dr. Lovena Le heart doctor can't see him until 03/23/27 but they offered appt with P.A. But he doesn't think he needs to see P.A. & wants to know what you think.  Is it ok for him to wait for Dr. Lovena Le 03/22/17 or should he see the P.A. Before then?  Please advise pt

## 2016-12-29 NOTE — Telephone Encounter (Signed)
Let's go ahead and have him see the PA and that can help expedite getting him in to see Dr. Lovena Le

## 2016-12-29 NOTE — Telephone Encounter (Signed)
Informed pt wife word for word and she said she would tell mr Swingle

## 2016-12-30 ENCOUNTER — Encounter: Payer: Self-pay | Admitting: Physician Assistant

## 2016-12-30 ENCOUNTER — Ambulatory Visit (INDEPENDENT_AMBULATORY_CARE_PROVIDER_SITE_OTHER): Payer: Medicare Other | Admitting: Physician Assistant

## 2016-12-30 VITALS — BP 152/64 | HR 53 | Ht 67.0 in | Wt 161.0 lb

## 2016-12-30 DIAGNOSIS — R001 Bradycardia, unspecified: Secondary | ICD-10-CM

## 2016-12-30 DIAGNOSIS — I10 Essential (primary) hypertension: Secondary | ICD-10-CM | POA: Diagnosis not present

## 2016-12-30 DIAGNOSIS — I471 Supraventricular tachycardia: Secondary | ICD-10-CM

## 2016-12-30 NOTE — Patient Instructions (Addendum)
Medication Instructions:  Your physician recommends that you continue on your current medications as directed. Please refer to the Current Medication list given to you today.   Labwork: Your physician recommends that you return for lab work today for BMET, CBC, PT/INR  Testing/Procedures: Your physician has recommended that you have a pacemaker inserted. A pacemaker is a small device that is placed under the skin of your chest or abdomen to help control abnormal heart rhythms. This device uses electrical pulses to prompt the heart to beat at a normal rate. Pacemakers are used to treat heart rhythms that are too slow. Wire (leads) are attached to the pacemaker that goes into the chambers of you heart. This is done in the hospital and usually requires and overnight stay. Please see the instruction sheet given to you today for more information.  Possible Dates: 6/18, 6/20, 6/26, 6/27, 6/29, 7/3  Please call back with date - [phone]    Follow-Up:   Any Other Special Instructions Will Be Listed Below (If Applicable). Gave instruction page "Preparing for Surgery" and bottle of CHG Soap.     If you need a refill on your cardiac medications before your next appointment, please call your pharmacy.  Thank you for choosing [facility]

## 2016-12-30 NOTE — Progress Notes (Signed)
Cardiology Office Note Date:  12/30/2016  Patient ID:  [name] [birth year], MRN [id number] PCP:  Denita Lung, MD  Cardiologist:  Dr. Lovena Le    Chief Complaint: bradycardia  History of Present Illness: [name] is a 83 y.o. male with history of SVT ablated (x3?), and bradycardia, referred back with patient observation of HR as low as 30's, he saw his PMD and recommended to revisit, PMH othersiw noted for HLD, HTN, mild VHD.  She was last seen by EP service July 2016 by A. Seiler, NP< at that time was feeling well, known condustion sytem disease without symptoms and recommended to continue conservative management unless he became symptomatic.  Dr. Lovena Le saw him in 2014, noted CHB at a PMD visit it seems incidentally and completely asymptomatic, and marked 1st degree AVBlock at that visit, planned for stress test to assess further finding mild chronotropic incomp, no high AVBlock.  The patient comes today noting that while he feels he has had some slow generalized decrease in energy over the years, in this last year has noticed times of profound exhaustion very uncharacteristic for him.  He remains very active, participating in many activities, particularly shooting basketball and golfing.  He will get home from golf and almost immediately need to go lay down, sometime while setaed and feeling OK doing nothing in particular will suddenly feel like he is completely worn and need to go lay down.  Not lightheaded, no near syncope or syncope, but completely drained of energy.  He has not hay kind of CP, palpitations, no SOB, no symptoms of PND or orthopnea and no DOE.  He started monitoring his pulse knowing his history and often finds it in the 40's and dips into 30's at times as well, this my manual carotid pulse and his pulse ox device  Past Medical History:  Diagnosis Date  . 1st degree AV block   . Allergy   . Arthritis   . Cataracts, bilateral   . Eczema   . HH  (hiatus hernia)   . History of colonic polyps   . Hypercholesterolemia   . Hypertension   . Prostate cancer ([place]) 1997  . SVT (supraventricular tachycardia) (HCC)     Past Surgical History:  Procedure Laterality Date  . AVNODE REENTRANT ABLATION    . COLONOSCOPY  2007   [facility]  . PROSTATE SURGERY    . TESTICLE SURGERY  2010   LEFT [facility]    Current Outpatient Prescriptions  Medication Sig Dispense Refill  . amLODipine (NORVASC) 5 MG tablet TAKE 1 TABLET BY MOUTH  DAILY 90 tablet 1  . aspirin 81 MG tablet Take 81 mg by mouth 2 (two) times daily.     . cetirizine (ZYRTEC) 10 MG tablet Take 10 mg by mouth daily.    Marland Kitchen desonide (DESOWEN) 0.05 % cream Apply topically 2 (two) times daily. 60 g 1  . fish oil-omega-3 fatty acids 1000 MG capsule Take 1,200 mg by mouth daily.    . fluticasone (FLONASE) 50 MCG/ACT nasal spray Use 2 sprays in each  nostril daily 48 g 1  . glucosamine-chondroitin 500-400 MG tablet Take 1 tablet by mouth 3 (three) times daily.      . Multiple Vitamins-Minerals (MULTIVITAMIN WITH MINERALS) tablet Take 1 tablet by mouth daily.      Marland Kitchen omeprazole (PRILOSEC) 20 MG capsule TAKE 1 CAPSULE BY MOUTH  TWICE DAILY BEFORE MEALS 180 capsule 2  . pravastatin (PRAVACHOL) 40 MG tablet  TAKE 1 TABLET BY MOUTH  DAILY 90 tablet 1   No current facility-administered medications for this visit.     Allergies:   Codeine   Social History:  The patient  reports that he has never smoked. He has never used smokeless tobacco. He reports that he does not drink alcohol or use drugs.   Family History:  The patient's family history includes Alcoholism in his father; Cancer in his mother.  ROS:  Please see the history of present illness.   All other systems are reviewed and otherwise negative.   PHYSICAL EXAM:  VS:  BP (!) 152/64 (BP Location: Left Arm)   Pulse (!) 53   Ht 5\' 7"  (1.702 m)   Wt 161 lb (73 kg)   BMI 25.22 kg/m  BMI: Body mass index is 25.22 kg/m. Well nourished,  well developed, in no acute distress  HEENT: normocephalic, atraumatic  Neck: no JVD, carotid bruits or masses Cardiac:  RRR; bradycardic, no significant murmurs, no rubs, or gallops Lungs:  CTA b/l , no wheezing, rhonchi or rales  Abd: soft, nontender MS: no deformity, age appropriate atrophy Ext: no edema  Skin: warm and dry, no rash Neuro:  No gross deficits appreciated Psych: euthymic mood, full affect  EKG:  Done today and reviewed by myself is SB marked 1st degree AVblock with period of high AVblock/CHB  11/30/15: TTE Study Conclusions - Left ventricle: The cavity size was normal. Wall thickness was   normal. Systolic function was normal. The estimated ejection   fraction was in the range of 55% to 60%. - Aortic valve: There was mild regurgitation. - Mitral valve: There was mild regurgitation.   Recent Labs: No results found for requested labs within last 8760 hours.  No results found for requested labs within last 8760 hours.   CrCl cannot be calculated (Patient's most recent lab result is older than the maximum 21 days allowed.).   Wt Readings from Last 3 Encounters:  12/30/16 161 lb (73 kg)  12/26/16 160 lb 3.2 oz (72.7 kg)  07/26/16 163 lb (73.9 kg)     Other studies reviewed: Additional studies/records reviewed today include: summarized above  ASSESSMENT AND PLAN:   The patient was seen as well by Dr. Lovena Le   1. Symptomatic bradycardia     Known longstanding hx of conduction system disease     Recommend PPM implant     Dr. Lovena Le discussed PPM procedure, Risks, benefits, he is agreeable to proceed, wanted to review withhis wife and call back to schedule  2. Hx of SVT ablated   3. HTN     Somewhat elevated here, no changes for now, will revisit post pacer   Disposition: The patient was given a few dats for PPM implant he will review with his wife and call to schedule.    Current medicines are reviewed at length with the patient today.  The patient did  not have any concerns regarding medicines.  Joseph Lasso, PA-C 12/30/2016 4:39 PM     [place] [id number] [phone] (office)  [phone] (fax)  EP Attending  Patient seen and examined. I have discussed insertion of DDD PM with the patient and he wishes to proceed. He has symptomatic bradycardia, with intermittent high grade AV block.  Mikle Bosworth.D.

## 2016-12-31 LAB — BASIC METABOLIC PANEL WITH GFR
BUN/Creatinine Ratio: 18 (ref 10–24)
BUN: 18 mg/dL (ref 8–27)
CO2: 23 mmol/L (ref 18–29)
Calcium: 9.8 mg/dL (ref 8.6–10.2)
Chloride: 103 mmol/L (ref 96–106)
Creatinine, Ser: 1.01 mg/dL (ref 0.76–1.27)
GFR calc Af Amer: 79 mL/min/{1.73_m2}
GFR calc non Af Amer: 68 mL/min/{1.73_m2}
Glucose: 80 mg/dL (ref 65–99)
Potassium: 4.5 mmol/L (ref 3.5–5.2)
Sodium: 144 mmol/L (ref 134–144)

## 2016-12-31 LAB — CBC
Hematocrit: 45.6 % (ref 37.5–51.0)
Hemoglobin: 15.5 g/dL (ref 13.0–17.7)
MCH: 29.6 pg (ref 26.6–33.0)
MCHC: 34 g/dL (ref 31.5–35.7)
MCV: 87 fL (ref 79–97)
Platelets: 208 10*3/uL (ref 150–379)
RBC: 5.24 x10E6/uL (ref 4.14–5.80)
RDW: 13.6 % (ref 12.3–15.4)
WBC: 7.7 10*3/uL (ref 3.4–10.8)

## 2016-12-31 LAB — PROTIME-INR
INR: 1 (ref 0.8–1.2)
PROTHROMBIN TIME: 10.8 s (ref 9.1–12.0)

## 2017-01-01 NOTE — Telephone Encounter (Signed)
done

## 2017-01-02 ENCOUNTER — Telehealth: Payer: Self-pay | Admitting: Internal Medicine

## 2017-01-02 NOTE — Telephone Encounter (Signed)
New message    Pt is calling about scheduling his procedure for a pacemaker on June 20th.

## 2017-01-03 NOTE — Telephone Encounter (Signed)
Called, spoke with pt. Informed pt is scheduled for PPM on 01/11/17, arriving at 11:00 AM to the [facility]. Pt does NOT need to come in for pre-procedure labs. Labs were drawn on 12/30/16. Nothing to eat or drink after midnight the night prior to procedure. Do not take medication the morning of procedure. Use CHG Soap the night prior to and morning of procedure (pt has soap already. I reviewed ALL instructions using soap). Informed pt needs to come in to the office for a wound check 10-14 days after procedure and 91 days with Dr. Lovena Le. Informed to call our office with questions or concerns.

## 2017-01-03 NOTE — Telephone Encounter (Signed)
F/U call :  Patient calling to schedule pacemaker procedure on 01-11-17. Thanks.

## 2017-01-05 ENCOUNTER — Telehealth: Payer: Self-pay

## 2017-01-05 NOTE — Telephone Encounter (Signed)
Pt calling with questions regarding upcoming procedure with Lovena Le

## 2017-01-05 NOTE — Telephone Encounter (Signed)
Called,spoke with pt. Reviewed pre-procedure instructions for PPM implant on 01/11/17. Pt verbalized understanding.

## 2017-01-06 ENCOUNTER — Encounter (INDEPENDENT_AMBULATORY_CARE_PROVIDER_SITE_OTHER): Payer: Medicare Other | Admitting: Ophthalmology

## 2017-01-06 DIAGNOSIS — H35033 Hypertensive retinopathy, bilateral: Secondary | ICD-10-CM | POA: Diagnosis not present

## 2017-01-06 DIAGNOSIS — I10 Essential (primary) hypertension: Secondary | ICD-10-CM | POA: Diagnosis not present

## 2017-01-06 DIAGNOSIS — H33301 Unspecified retinal break, right eye: Secondary | ICD-10-CM

## 2017-01-06 DIAGNOSIS — D3131 Benign neoplasm of right choroid: Secondary | ICD-10-CM

## 2017-01-06 DIAGNOSIS — H348322 Tributary (branch) retinal vein occlusion, left eye, stable: Secondary | ICD-10-CM

## 2017-01-11 ENCOUNTER — Ambulatory Visit (HOSPITAL_COMMUNITY)
Admission: RE | Admit: 2017-01-11 | Discharge: 2017-01-12 | Disposition: A | Discharge status: Home / Self Care | Payer: Medicare Other | Source: Ambulatory Visit | Attending: Internal Medicine | Admitting: Internal Medicine

## 2017-01-11 ENCOUNTER — Encounter (HOSPITAL_COMMUNITY): Payer: Self-pay | Admitting: General Practice

## 2017-01-11 ENCOUNTER — Encounter (HOSPITAL_COMMUNITY)
Admission: RE | Disposition: A | Discharge status: Home / Self Care | Payer: Self-pay | Source: Ambulatory Visit | Attending: Internal Medicine

## 2017-01-11 ENCOUNTER — Ambulatory Visit (HOSPITAL_BASED_OUTPATIENT_CLINIC_OR_DEPARTMENT_OTHER): Payer: Medicare Other

## 2017-01-11 DIAGNOSIS — I442 Atrioventricular block, complete: Secondary | ICD-10-CM | POA: Diagnosis not present

## 2017-01-11 DIAGNOSIS — E785 Hyperlipidemia, unspecified: Secondary | ICD-10-CM | POA: Diagnosis not present

## 2017-01-11 DIAGNOSIS — Z7982 Long term (current) use of aspirin: Secondary | ICD-10-CM | POA: Diagnosis not present

## 2017-01-11 DIAGNOSIS — R001 Bradycardia, unspecified: Secondary | ICD-10-CM | POA: Diagnosis not present

## 2017-01-11 DIAGNOSIS — L7632 Postprocedural hematoma of skin and subcutaneous tissue following other procedure: Secondary | ICD-10-CM | POA: Diagnosis not present

## 2017-01-11 DIAGNOSIS — I350 Nonrheumatic aortic (valve) stenosis: Secondary | ICD-10-CM | POA: Diagnosis not present

## 2017-01-11 DIAGNOSIS — E7800 Pure hypercholesterolemia, unspecified: Secondary | ICD-10-CM | POA: Insufficient documentation

## 2017-01-11 DIAGNOSIS — M1990 Unspecified osteoarthritis, unspecified site: Secondary | ICD-10-CM | POA: Insufficient documentation

## 2017-01-11 DIAGNOSIS — I97791 Other intraoperative cardiac functional disturbances during other surgery: Secondary | ICD-10-CM | POA: Diagnosis not present

## 2017-01-11 DIAGNOSIS — I10 Essential (primary) hypertension: Secondary | ICD-10-CM | POA: Diagnosis not present

## 2017-01-11 DIAGNOSIS — I959 Hypotension, unspecified: Secondary | ICD-10-CM | POA: Diagnosis not present

## 2017-01-11 DIAGNOSIS — Z7951 Long term (current) use of inhaled steroids: Secondary | ICD-10-CM | POA: Insufficient documentation

## 2017-01-11 DIAGNOSIS — Z950 Presence of cardiac pacemaker: Secondary | ICD-10-CM

## 2017-01-11 DIAGNOSIS — Y838 Other surgical procedures as the cause of abnormal reaction of the patient, or of later complication, without mention of misadventure at the time of the procedure: Secondary | ICD-10-CM | POA: Diagnosis not present

## 2017-01-11 DIAGNOSIS — I441 Atrioventricular block, second degree: Secondary | ICD-10-CM

## 2017-01-11 HISTORY — PX: INSERT / REPLACE / REMOVE PACEMAKER: SUR710

## 2017-01-11 HISTORY — DX: Gastro-esophageal reflux disease without esophagitis: K21.9

## 2017-01-11 HISTORY — DX: Personal history of other medical treatment: Z92.89

## 2017-01-11 HISTORY — PX: PACEMAKER IMPLANT: EP1218

## 2017-01-11 HISTORY — DX: Other seasonal allergic rhinitis: J30.2

## 2017-01-11 HISTORY — DX: Presence of cardiac pacemaker: Z95.0

## 2017-01-11 LAB — SURGICAL PCR SCREEN
MRSA, PCR: NEGATIVE
Staphylococcus aureus: NEGATIVE

## 2017-01-11 LAB — ECHOCARDIOGRAM LIMITED
HEIGHTINCHES: 67 in
WEIGHTICAEL: 2528 [oz_av]

## 2017-01-11 SURGERY — PACEMAKER IMPLANT

## 2017-01-11 MED ORDER — MUPIROCIN 2 % EX OINT
TOPICAL_OINTMENT | CUTANEOUS | Status: AC
  Administered 2017-01-11: 1 via TOPICAL
  Filled 2017-01-11: qty 22

## 2017-01-11 MED ORDER — ADULT MULTIVITAMIN W/MINERALS CH
1.0000 | ORAL_TABLET | Freq: Every day | ORAL | Status: DC
  Administered 2017-01-12: 10:00:00 1 via ORAL
  Filled 2017-01-11: qty 1

## 2017-01-11 MED ORDER — SODIUM CHLORIDE 0.9 % IR SOLN
Status: AC
  Filled 2017-01-11: qty 2

## 2017-01-11 MED ORDER — CHLORHEXIDINE GLUCONATE 4 % EX LIQD: 60.0000 mL | Freq: Once | CUTANEOUS | Status: DC

## 2017-01-11 MED ORDER — MIDAZOLAM HCL 5 MG/5ML IJ SOLN
INTRAMUSCULAR | Status: AC
  Filled 2017-01-11: qty 5

## 2017-01-11 MED ORDER — SODIUM CHLORIDE 0.9 % IV SOLN
INTRAVENOUS | Status: AC | PRN
  Administered 2017-01-11: 500 mL via INTRAVENOUS

## 2017-01-11 MED ORDER — LIDOCAINE HCL (PF) 1 % IJ SOLN
INTRAMUSCULAR | Status: DC | PRN
  Administered 2017-01-11: 40 mL via INTRADERMAL

## 2017-01-11 MED ORDER — LIDOCAINE HCL (PF) 1 % IJ SOLN
INTRAMUSCULAR | Status: AC
  Filled 2017-01-11: qty 60

## 2017-01-11 MED ORDER — IBUPROFEN 200 MG PO TABS
400.0000 mg | ORAL_TABLET | Freq: Every day | ORAL | Status: DC | PRN
  Administered 2017-01-12: 400 mg via ORAL
  Filled 2017-01-11: qty 2

## 2017-01-11 MED ORDER — MUPIROCIN 2 % EX OINT
1.0000 "application " | TOPICAL_OINTMENT | Freq: Once | CUTANEOUS | Status: AC
  Administered 2017-01-11: 1 via TOPICAL

## 2017-01-11 MED ORDER — FENTANYL CITRATE (PF) 100 MCG/2ML IJ SOLN
INTRAMUSCULAR | Status: AC
  Filled 2017-01-11: qty 2

## 2017-01-11 MED ORDER — CEFAZOLIN SODIUM-DEXTROSE 2-4 GM/100ML-% IV SOLN
INTRAVENOUS | Status: AC
  Filled 2017-01-11: qty 100

## 2017-01-11 MED ORDER — FENTANYL CITRATE (PF) 100 MCG/2ML IJ SOLN
INTRAMUSCULAR | Status: DC | PRN
  Administered 2017-01-11 (×4): 12.5 ug via INTRAVENOUS
  Administered 2017-01-11: 25 ug via INTRAVENOUS

## 2017-01-11 MED ORDER — HEPARIN (PORCINE) IN NACL 2-0.9 UNIT/ML-% IJ SOLN
INTRAMUSCULAR | Status: AC
  Filled 2017-01-11: qty 500

## 2017-01-11 MED ORDER — AMLODIPINE BESYLATE 5 MG PO TABS
5.0000 mg | ORAL_TABLET | Freq: Every day | ORAL | Status: DC
  Administered 2017-01-11 – 2017-01-12 (×2): 5 mg via ORAL
  Filled 2017-01-11 (×3): qty 1

## 2017-01-11 MED ORDER — FLUMAZENIL 1 MG/10ML IV SOLN
INTRAVENOUS | Status: DC | PRN
  Administered 2017-01-11: 0.3 mg via INTRAVENOUS

## 2017-01-11 MED ORDER — SODIUM CHLORIDE 0.9 % IV SOLN
INTRAVENOUS | Status: AC | PRN
  Administered 2017-01-11: 50 mL/h via INTRAVENOUS

## 2017-01-11 MED ORDER — SODIUM CHLORIDE 0.9 % IV SOLN
INTRAVENOUS | Status: DC
  Administered 2017-01-11: 12:00:00 via INTRAVENOUS

## 2017-01-11 MED ORDER — SODIUM CHLORIDE 0.9 % IR SOLN
80.0000 mg | Status: AC
  Administered 2017-01-11: 80 mg

## 2017-01-11 MED ORDER — CEFAZOLIN SODIUM-DEXTROSE 1-4 GM/50ML-% IV SOLN
1.0000 g | Freq: Four times a day (QID) | INTRAVENOUS | Status: AC
  Administered 2017-01-11 – 2017-01-12 (×3): 1 g via INTRAVENOUS
  Filled 2017-01-11 (×3): qty 50

## 2017-01-11 MED ORDER — GLUCOSAMINE-CHONDROITIN 500-400 MG PO TABS: 1.0000 | ORAL_TABLET | Freq: Three times a day (TID) | ORAL | Status: DC

## 2017-01-11 MED ORDER — CEFAZOLIN SODIUM-DEXTROSE 2-4 GM/100ML-% IV SOLN
2.0000 g | INTRAVENOUS | Status: AC
  Administered 2017-01-11: 2 g via INTRAVENOUS

## 2017-01-11 MED ORDER — FLUMAZENIL 1 MG/10ML IV SOLN
INTRAVENOUS | Status: AC
  Filled 2017-01-11: qty 10

## 2017-01-11 MED ORDER — ASPIRIN EC 81 MG PO TBEC
162.0000 mg | DELAYED_RELEASE_TABLET | Freq: Every day | ORAL | Status: DC
  Administered 2017-01-11: 20:00:00 162 mg via ORAL
  Filled 2017-01-11 (×2): qty 2

## 2017-01-11 MED ORDER — ACETAMINOPHEN 325 MG PO TABS
325.0000 mg | ORAL_TABLET | ORAL | Status: DC | PRN
  Administered 2017-01-11: 650 mg via ORAL
  Filled 2017-01-11: qty 2

## 2017-01-11 MED ORDER — ONDANSETRON HCL 4 MG/2ML IJ SOLN: 4.0000 mg | Freq: Four times a day (QID) | INTRAMUSCULAR | Status: DC | PRN

## 2017-01-11 MED ORDER — MIDAZOLAM HCL 5 MG/5ML IJ SOLN
INTRAMUSCULAR | Status: DC | PRN
  Administered 2017-01-11 (×3): 1 mg via INTRAVENOUS
  Administered 2017-01-11: 2 mg via INTRAVENOUS
  Administered 2017-01-11: 1 mg via INTRAVENOUS

## 2017-01-11 MED ORDER — HEPARIN (PORCINE) IN NACL 2-0.9 UNIT/ML-% IJ SOLN
INTRAMUSCULAR | Status: AC | PRN
  Administered 2017-01-11: 500 mL

## 2017-01-11 MED ORDER — PANTOPRAZOLE SODIUM 40 MG PO TBEC
40.0000 mg | DELAYED_RELEASE_TABLET | Freq: Every day | ORAL | Status: DC
  Administered 2017-01-11 – 2017-01-12 (×2): 40 mg via ORAL
  Filled 2017-01-11 (×2): qty 1

## 2017-01-11 MED ORDER — YOU HAVE A PACEMAKER BOOK
Freq: Once | Status: AC
  Administered 2017-01-11: 20:00:00
  Filled 2017-01-11: qty 1

## 2017-01-11 SURGICAL SUPPLY — 12 items
CABLE SURGICAL S-101-97-12 (CABLE) ×1 IMPLANT
CATH RIGHTSITE C315HIS02 (CATHETERS) ×1 IMPLANT
IPG PACE AZUR XT DR MRI W1DR01 (Pacemaker) IMPLANT
LEAD CAPSURE NOVUS 5076-52CM (Lead) ×1 IMPLANT
LEAD CAPSURE NOVUS 5076-58CM (Lead) ×1 IMPLANT
PACE AZURE XT DR MRI W1DR01 (Pacemaker) ×2 IMPLANT
PAD DEFIB LIFELINK (PAD) ×1 IMPLANT
SHEATH CLASSIC 7F (SHEATH) ×1 IMPLANT
SHEATH CLASSIC 9F (SHEATH) ×1 IMPLANT
SLITTER 6232ADJ (MISCELLANEOUS) ×1 IMPLANT
TRAY PACEMAKER INSERTION (PACKS) ×1 IMPLANT
WIRE HI TORQ VERSACORE-J 145CM (WIRE) ×1 IMPLANT

## 2017-01-11 NOTE — Progress Notes (Signed)
  Echocardiogram 2D Echocardiogram has been performed.  [name] 01/11/2017, 5:18 PM

## 2017-01-11 NOTE — Progress Notes (Addendum)
Dr. Lovena Le in to see and speak with patient while echo being completed. IV saline locked.

## 2017-01-11 NOTE — Progress Notes (Signed)
Bedside echocardiogram being done

## 2017-01-11 NOTE — Care Management Note (Signed)
Case Management Note  Patient Details  Name: [name] MRN: [id number] Date of Birth: 1932/02/01  Subjective/Objective:    From home , s/p pace maker implant.   PCP Jill Alexanders               Action/Plan: NCM will follow for dc needs.  Expected Discharge Date:                  Expected Discharge Plan:     In-House Referral:     Discharge planning Services  CM Consult  Post Acute Care Choice:    Choice offered to:     DME Arranged:    DME Agency:     HH Arranged:    HH Agency:     Status of Service:  In process, will continue to follow  If discussed at Long Length of Stay Meetings, dates discussed:    Additional Comments:  Zenon Mayo, RN 01/11/2017, 10:37 PM

## 2017-01-11 NOTE — Interval H&P Note (Signed)
History and Physical Interval Note:  01/11/2017 1:48 PM  [name]  has presented today for surgery, with the diagnosis of bradicardia  The various methods of treatment have been discussed with the patient and family. After consideration of risks, benefits and other options for treatment, the patient has consented to  Procedure(s): Pacemaker Implant (N/A) as a surgical intervention .  The patient's history has been reviewed, patient examined, no change in status, stable for surgery.  I have reviewed the patient's chart and labs.  Questions were answered to the patient's satisfaction.     [name]

## 2017-01-11 NOTE — H&P (Signed)
[name] is an 83 y.o. male.   Chief Complaint: near syncope HPI: The patient is a very pleasant 83 year old man who is well-known to me with a history of SVT status post catheter ablation in the past. His last catheter ablation was over 10 years ago. The patient has had symptomatic bradycardia with fatigue and weakness. When he checks his heart rate, is in the 30s. He was seen in our office and was found to have Mobitz 2 second-degree AV block competing with complete heart block. His ventricular escape ranges from the 30s to the low 40s, and at times little higher. He has not had frank syncope. He has dyspnea with exertion. He has minimal peripheral edema. Of note, when he underwent catheter ablation many years ago, he had very long first-degree AV block with a PR interval of nearly 400 ms.  Past Medical History:  Diagnosis Date  . 1st degree AV block   . Allergy   . Arthritis   . Cataracts, bilateral   . Eczema   . HH (hiatus hernia)   . History of colonic polyps   . Hypercholesterolemia   . Hypertension   . Prostate cancer ([place]) 1997  . SVT (supraventricular tachycardia) (HCC)     Past Surgical History:  Procedure Laterality Date  . AVNODE REENTRANT ABLATION    . COLONOSCOPY  2007   [facility]OD  . PROSTATE SURGERY    . TESTICLE SURGERY  2010   LEFT [facility]    Family History  Problem Relation Age of Onset  . Cancer Mother   . Alcoholism Father    Social History:  reports that he has never smoked. He has never used smokeless tobacco. He reports that he does not drink alcohol or use drugs.  Allergies:  Allergies  Allergen Reactions  . Codeine Nausea And Vomiting    Medications Prior to Admission  Medication Sig Dispense Refill  . acetaminophen (TYLENOL) 650 MG CR tablet Take 650 mg by mouth daily as needed for pain.    Marland Kitchen amLODipine (NORVASC) 5 MG tablet TAKE 1 TABLET BY MOUTH  DAILY 90 tablet 1  . aspirin 81 MG tablet Take 162 mg by mouth daily.     . cetirizine  (ZYRTEC) 10 MG tablet Take 10 mg by mouth at bedtime.     Marland Kitchen desonide (DESOWEN) 0.05 % cream Apply topically 2 (two) times daily. (Patient taking differently: Apply 1 application topically daily. ) 60 g 1  . fluticasone (FLONASE) 50 MCG/ACT nasal spray Use 2 sprays in each  nostril daily (Patient taking differently: Use 1 spray in each nostril at bedtime.) 48 g 1  . glucosamine-chondroitin 500-400 MG tablet Take 1 tablet by mouth 3 (three) times daily.      Marland Kitchen ibuprofen (ADVIL,MOTRIN) 200 MG tablet Take 400 mg by mouth daily as needed (pain).    . Multiple Vitamins-Minerals (MULTIVITAMIN WITH MINERALS) tablet Take 1 tablet by mouth daily.      . Omega-3 Fatty Acids (FISH OIL) 1200 MG CAPS Take 1,200 mg by mouth daily.    Marland Kitchen omeprazole (PRILOSEC) 20 MG capsule TAKE 1 CAPSULE BY MOUTH  TWICE DAILY BEFORE MEALS (Patient taking differently: Takes one tablet every morning. Takes one tablet at bedtime as needed for acid reflux.) 180 capsule 2  . pravastatin (PRAVACHOL) 40 MG tablet TAKE 1 TABLET BY MOUTH  DAILY (Patient taking differently: TAKE 1 TABLET BY MOUTH at bedtime) 90 tablet 1    Results for orders placed or performed during  the hospital encounter of 01/11/17 (from the past 48 hour(s))  Surgical PCR screen     Status: None   Collection Time: 01/11/17 11:19 AM  Result Value Ref Range   MRSA, PCR NEGATIVE NEGATIVE   Staphylococcus aureus NEGATIVE NEGATIVE    Comment:        The Xpert SA Assay (FDA approved for NASAL specimens in patients over 79 years of age), is one component of a comprehensive surveillance program.  Test performance has been validated by [facility] for patients greater than or equal to 17 year old. It is not intended to diagnose infection nor to guide or monitor treatment.    No results found.  ROS all systems reviewed and negative except as noted in the history of present illness -   Blood pressure (!) 189/79, pulse (!) 40, temperature 97.6 F (36.4 C),  temperature source Oral, height 5\' 7"  (1.702 m), weight 158 lb (71.7 kg), SpO2 96 %. Physical Exam  Well appearing elderly man, NAD HEENT: Unremarkable,[place], AT Neck:  7 cm JVD, no thyromegally Back:  No CVA tenderness Lungs:  Clear with no wheezes, rales, or rhonchi HEART:  IRegular brady rhythm, no murmurs, no rubs, no clicks Abd:  soft, positive bowel sounds, no organomegally, no rebound, no guarding Ext:  2 plus pulses, no edema, no cyanosis, no clubbing Skin:  No rashes no nodules Neuro:  CN II through XII intact, motor grossly intact   EKG - sinus bradycardia with intermittent complete heart block, with occasional conducted beats.   Assessment/Plan 1. Symptomatic bradycardia due to intermittent complete heart block with high-grade heart block  - I discussed the treatment options with the patient. The risk, goals, benefits, and expectations of permanent pacemaker insertion have been reviewed and he wishes to proceed.  2. Hypertension  - his blood pressure is elevated. Once his pacemaker is placed we'll plan to proceed with upper titration of his medications including AV node blocking drugs. He is currently on no AV nodal blocking drugs.   [name], M.D.  [name] 01/11/2017, 1:41 PM

## 2017-01-11 NOTE — Interval H&P Note (Signed)
History and Physical Interval Note:  01/11/2017 2:06 PM  [name]  has presented today for surgery, with the diagnosis of bradicardia  The various methods of treatment have been discussed with the patient and family. After consideration of risks, benefits and other options for treatment, the patient has consented to  Procedure(s): Pacemaker Implant (N/A) as a surgical intervention .  The patient's history has been reviewed, patient examined, no change in status, stable for surgery.  I have reviewed the patient's chart and labs.  Questions were answered to the patient's satisfaction.     [name]

## 2017-01-11 NOTE — Progress Notes (Signed)
PHARMACIST - PHYSICIAN ORDER COMMUNICATION  CONCERNING: P&T Medication Policy on Herbal Medications  DESCRIPTION:  This patient's order for:  Glucosamine/chondroitin has been noted.  This product(s) is classified as an "herbal" or natural product. Due to a lack of definitive safety studies or FDA approval, nonstandard manufacturing practices, plus the potential risk of unknown drug-drug interactions while on inpatient medications, the Pharmacy and Therapeutics Committee does not permit the use of "herbal" or natural products of this type within [facility].   ACTION TAKEN: The pharmacy department is unable to verify this order at this time and your patient has been informed of this safety policy. Please reevaluate patient's clinical condition at discharge and address if the herbal or natural product(s) should be resumed at that time.  Erin Hearing PharmD., BCPS Clinical Pharmacist Pager [phone] 01/11/2017 5:51 PM

## 2017-01-12 ENCOUNTER — Ambulatory Visit (HOSPITAL_COMMUNITY): Payer: Medicare Other

## 2017-01-12 ENCOUNTER — Encounter (HOSPITAL_COMMUNITY): Payer: Self-pay | Admitting: Internal Medicine

## 2017-01-12 DIAGNOSIS — E7800 Pure hypercholesterolemia, unspecified: Secondary | ICD-10-CM | POA: Diagnosis not present

## 2017-01-12 DIAGNOSIS — I442 Atrioventricular block, complete: Secondary | ICD-10-CM | POA: Diagnosis not present

## 2017-01-12 DIAGNOSIS — E785 Hyperlipidemia, unspecified: Secondary | ICD-10-CM | POA: Diagnosis not present

## 2017-01-12 DIAGNOSIS — Z950 Presence of cardiac pacemaker: Secondary | ICD-10-CM | POA: Diagnosis not present

## 2017-01-12 DIAGNOSIS — Z7982 Long term (current) use of aspirin: Secondary | ICD-10-CM | POA: Diagnosis not present

## 2017-01-12 DIAGNOSIS — I959 Hypotension, unspecified: Secondary | ICD-10-CM | POA: Diagnosis not present

## 2017-01-12 DIAGNOSIS — L7632 Postprocedural hematoma of skin and subcutaneous tissue following other procedure: Secondary | ICD-10-CM | POA: Diagnosis not present

## 2017-01-12 DIAGNOSIS — I10 Essential (primary) hypertension: Secondary | ICD-10-CM | POA: Diagnosis not present

## 2017-01-12 DIAGNOSIS — Z7951 Long term (current) use of inhaled steroids: Secondary | ICD-10-CM | POA: Diagnosis not present

## 2017-01-12 DIAGNOSIS — R001 Bradycardia, unspecified: Secondary | ICD-10-CM | POA: Diagnosis not present

## 2017-01-12 DIAGNOSIS — I97791 Other intraoperative cardiac functional disturbances during other surgery: Secondary | ICD-10-CM | POA: Diagnosis not present

## 2017-01-12 DIAGNOSIS — M1990 Unspecified osteoarthritis, unspecified site: Secondary | ICD-10-CM | POA: Diagnosis not present

## 2017-01-12 NOTE — Discharge Instructions (Signed)
° ° °  Supplemental Discharge Instructions for  Pacemaker/Defibrillator Patients  Activity No heavy lifting or vigorous activity with your left/right arm for 6 to 8 weeks.  Do not raise your left/right arm above your head for one week.  Gradually raise your affected arm as drawn below.           __         01/15/17                  01/16/17                    01/17/17              01/18/17  NO DRIVING for    1 week ; you may begin driving on   01/07/06  .  WOUND CARE - Keep the wound area clean and dry.  Do not get this area wet for one week. No showers for one week; you may shower on  01/18/17   . - The tape/steri-strips on your wound will fall off; do not pull them off.  No bandage is needed on the site.  DO  NOT apply any creams, oils, or ointments to the wound area. - If you notice any drainage or discharge from the wound, any swelling or bruising at the site, or you develop a fever > 101? F after you are discharged home, call the office at once.  Special Instructions - You are still able to use cellular telephones; use the ear opposite the side where you have your pacemaker/defibrillator.  Avoid carrying your cellular phone near your device. - When traveling through airports, show security personnel your identification card to avoid being screened in the metal detectors.  Ask the security personnel to use the hand wand. - Avoid arc welding equipment, MRI testing (magnetic resonance imaging), TENS units (transcutaneous nerve stimulators).  Call the office for questions about other devices. - Avoid electrical appliances that are in poor condition or are not properly grounded. - Microwave ovens are safe to be near or to operate.

## 2017-01-12 NOTE — Discharge Summary (Signed)
ELECTROPHYSIOLOGY PROCEDURE DISCHARGE SUMMARY    Patient ID: [name],  MRN: [id number], DOB/AGE: [birth year] 83 y.o.  Admit date: 01/11/2017 Discharge date: 01/12/2017  Primary Care Physician: Denita Lung, MD Electrophysiologist: Lovena Le  Primary Discharge Diagnosis:  Symptomatic complete heart block status post pacemaker implantation this admission  Secondary Discharge Diagnosis:  1.  SVT s/p ablation 2.  HTN 3.  Hyperlipidemia   Allergies  Allergen Reactions  . Codeine Nausea And Vomiting     Procedures This Admission:  1.  Implantation of a MDT dual chamber PPM on 01/11/17 by Dr Lovena Le.  The patient received a MDT model number Azure PPM with model number 5076 right atrial lead and 5076 right ventricular lead. There were no immediate post procedure complications except for transient hypotension which resolved with fluids and reversal of sedation. 2.  CXR on 01/12/17 demonstrated no pneumothorax status post device implantation.   Brief HPI: [name] is a 83 y.o. male with a past medical history as outlined above. He has had progressive bradycardia with associated exercise intolerance and fatigue. The patient has had symptomatic bradycardia without reversible causes identified.  Risks, benefits, and alternatives to PPM implantation were reviewed with the patient who wished to proceed.   Hospital Course:  The patient was admitted and underwent implantation of a MDT dual chamber with details as outlined above.  He  was monitored on telemetry overnight which demonstrated sinus rhythm with V pacing.  Left chest was with moderate hematoma which was manually compressed and pressure dressing was applied.   The device was interrogated and found to be functioning normally.  CXR was obtained and demonstrated no pneumothorax status post device implantation.  Wound care, arm mobility, and restrictions were reviewed with the patient.  The patient was examined and considered  stable for discharge to home.    Physical Exam: Vitals:   01/11/17 1942 01/11/17 2000 01/12/17 0541 01/12/17 0736  BP: (!) 167/94  (!) 144/76 (!) 172/79  Pulse: 87  68 63  Resp: 17 (!) 22 18 20   Temp: 97.6 F (36.4 C)  97.7 F (36.5 C) 97.6 F (36.4 C)  TempSrc: Oral  Oral Oral  SpO2: 98%  94% 95%  Weight:   156 lb 8.4 oz (71 kg)   Height:        GEN- The patient is well appearing, alert and oriented x 3 today.   HEENT: normocephalic, atraumatic; sclera clear, conjunctiva pink; hearing intact; oropharynx clear; neck supple  Lungs- Clear to ausculation bilaterally, normal work of breathing.  No wheezes, rales, rhonchi Heart- Regular rate and rhythm (paced) GI- soft, non-tender, non-distended, bowel sounds present  Extremities- no clubbing, cyanosis, or edema  MS- no significant deformity or atrophy Skin- warm and dry, no rash or lesion, left chest with moderate hematoma Psych- euthymic mood, full affect Neuro- strength and sensation are intact   Labs:   Lab Results  Component Value Date   WBC 7.7 12/30/2016   HGB 15.5 12/30/2016   HCT 45.6 12/30/2016   MCV 87 12/30/2016   PLT 208 12/30/2016   No results for input(s): NA, K, CL, CO2, BUN, CREATININE, CALCIUM, PROT, BILITOT, ALKPHOS, ALT, AST, GLUCOSE in the last 168 hours.  Invalid input(s): LABALBU  Discharge Medications:  Allergies as of 01/12/2017      Reactions   Codeine Nausea And Vomiting      Medication List    TAKE these medications   acetaminophen 650 MG CR tablet Commonly  known as:  TYLENOL Take 650 mg by mouth daily as needed for pain.   amLODipine 5 MG tablet Commonly known as:  NORVASC TAKE 1 TABLET BY MOUTH  DAILY   aspirin 81 MG tablet Take 162 mg by mouth daily.   cetirizine 10 MG tablet Commonly known as:  ZYRTEC Take 10 mg by mouth at bedtime.   desonide 0.05 % cream Commonly known as:  DESOWEN Apply topically 2 (two) times daily. What changed:  how much to take  when to take  this   Fish Oil 1200 MG Caps Take 1,200 mg by mouth daily.   fluticasone 50 MCG/ACT nasal spray Commonly known as:  FLONASE Use 2 sprays in each  nostril daily What changed:  See the new instructions.   glucosamine-chondroitin 500-400 MG tablet Take 1 tablet by mouth 3 (three) times daily.   ibuprofen 200 MG tablet Commonly known as:  ADVIL,MOTRIN Take 400 mg by mouth daily as needed (pain).   multivitamin with minerals tablet Take 1 tablet by mouth daily.   omeprazole 20 MG capsule Commonly known as:  PRILOSEC TAKE 1 CAPSULE BY MOUTH  TWICE DAILY BEFORE MEALS What changed:  See the new instructions.   pravastatin 40 MG tablet Commonly known as:  PRAVACHOL TAKE 1 TABLET BY MOUTH  DAILY What changed:  See the new instructions.       Disposition:  Discharge Instructions    Diet - low sodium heart healthy    Complete by:  As directed    Increase activity slowly    Complete by:  As directed      Follow-up Information    [place] [facility] Follow up on 01/16/2017.   Specialty:  Cardiology Why:  at 8:30AM Contact information: [address] [place]       Evans Lance, MD Follow up on 04/13/2017.   Specialty:  Cardiology Why:  at 9:45AM Contact information: [address] [address] [phone]           Duration of Discharge Encounter: Greater than 30 minutes including physician time.  Signed, Chanetta Marshall, NP 01/12/2017 9:07 AM  EP Attending  Patient seen and examined. Agree with the findings as noted above with minimal modification. He is stable after PPM with small/mod hematoma which has been compressed. I would like him to hold ASA. He is not on a blood thinner. Usual followup. His device has been interogated under my direct supervision and is working normally.   Mikle Bosworth.D.

## 2017-01-16 ENCOUNTER — Ambulatory Visit (INDEPENDENT_AMBULATORY_CARE_PROVIDER_SITE_OTHER): Payer: Self-pay | Admitting: *Deleted

## 2017-01-16 DIAGNOSIS — T148XXA Other injury of unspecified body region, initial encounter: Secondary | ICD-10-CM

## 2017-01-16 NOTE — Progress Notes (Signed)
Patient presents to the office for a wound check d/t hematoma s/p ppm implant on 6/20. Pressure dressing removed. Dressing and steri strips without drainage. Small/moderate hematoma present with red, purple, and yellow ecchymosis surrounding L chest and arm. Hematoma soft upon palpation. Steri strips remain in place. Dr.Camnitz assessed patient's hematoma and recommended that he continue to hold ASA until appt on 7/2. Another pressure dressing was not recommended at this time. Patient was encouraged to call if the site becomes larger, tighter, or begins to drain. Patient verbalized understanding of instructions.

## 2017-01-23 ENCOUNTER — Ambulatory Visit (INDEPENDENT_AMBULATORY_CARE_PROVIDER_SITE_OTHER): Payer: Medicare Other | Admitting: *Deleted

## 2017-01-23 ENCOUNTER — Telehealth: Payer: Self-pay

## 2017-01-23 DIAGNOSIS — R001 Bradycardia, unspecified: Secondary | ICD-10-CM | POA: Diagnosis not present

## 2017-01-23 LAB — CUP PACEART INCLINIC DEVICE CHECK
Battery Remaining Longevity: 129 mo
Battery Voltage: 3.2 V
Brady Statistic AS VS Percent: 2.31 %
Implantable Lead Implant Date: 20180620
Implantable Pulse Generator Implant Date: 20180620
Lead Channel Impedance Value: 380 Ohm
Lead Channel Impedance Value: 456 Ohm
Lead Channel Impedance Value: 475 Ohm
Lead Channel Pacing Threshold Amplitude: 0.5 V
Lead Channel Pacing Threshold Amplitude: 0.625 V
Lead Channel Pacing Threshold Pulse Width: 0.4 ms
Lead Channel Sensing Intrinsic Amplitude: 3.125 mV
Lead Channel Sensing Intrinsic Amplitude: 5.25 mV
Lead Channel Sensing Intrinsic Amplitude: 6.75 mV
Lead Channel Setting Sensing Sensitivity: 2 mV
MDC IDC LEAD IMPLANT DT: 20180620
MDC IDC LEAD LOCATION: 753859
MDC IDC LEAD LOCATION: 753860
MDC IDC MSMT LEADCHNL RA PACING THRESHOLD PULSEWIDTH: 0.4 ms
MDC IDC MSMT LEADCHNL RV IMPEDANCE VALUE: 361 Ohm
MDC IDC MSMT LEADCHNL RV SENSING INTR AMPL: 5.25 mV
MDC IDC SESS DTM: 20180702151117
MDC IDC SET LEADCHNL RA PACING AMPLITUDE: 3.5 V
MDC IDC SET LEADCHNL RV PACING AMPLITUDE: 3.5 V
MDC IDC SET LEADCHNL RV PACING PULSEWIDTH: 0.4 ms
MDC IDC STAT BRADY AP VP PERCENT: 44.12 %
MDC IDC STAT BRADY AP VS PERCENT: 0.31 %
MDC IDC STAT BRADY AS VP PERCENT: 53.27 %
MDC IDC STAT BRADY RA PERCENT PACED: 44.21 %
MDC IDC STAT BRADY RV PERCENT PACED: 97.39 %

## 2017-01-23 NOTE — Progress Notes (Signed)
Wound check appointment. Steri-strips removed. Hematoma present since discharge s/p pressure dressing. Patient reports significant improvement. Assessment finds yellowed bruising inferior to incision and pacemaker. Currently hematoma soft to palpation. Patient instructed to restart aspirin on 7/7 per GT. Normal device function. Thresholds, sensing, and impedances consistent with implant measurements. Device programmed at 3.5V on for extra safety margin until 3 month visit. Histogram distribution appropriate for patient and level of activity. No mode switches or high ventricular rates noted. Patient educated about wound care, arm mobility, lifting restrictions. ROV 9/20 with GT

## 2017-01-23 NOTE — Telephone Encounter (Signed)
Spoke with patient who stated that his aspirin had been held s/p hematoma. Per GT he may restart his aspiring on Saturday 7/7. Patient verbalized understanding.

## 2017-01-26 ENCOUNTER — Other Ambulatory Visit: Payer: Self-pay | Admitting: Family Medicine

## 2017-01-27 ENCOUNTER — Telehealth: Payer: Self-pay

## 2017-01-27 ENCOUNTER — Ambulatory Visit (INDEPENDENT_AMBULATORY_CARE_PROVIDER_SITE_OTHER): Payer: Self-pay | Admitting: *Deleted

## 2017-01-27 DIAGNOSIS — R001 Bradycardia, unspecified: Secondary | ICD-10-CM

## 2017-01-27 NOTE — Telephone Encounter (Signed)
Spoke with patient and explained that he could hold his aspirin until Friday, July the 13th. Patient verbalized understanding.

## 2017-01-27 NOTE — Progress Notes (Signed)
   Patient seen as add-on with concerns that hematoma was "hard" to palpation. Picture taken with verbal patient permission and attached. Up assessment hematoma soft to palpation and free of pain or tenderness. Patient was concerned about re-starting aspirin on 7/7. I will review with GT.

## 2017-01-27 NOTE — Telephone Encounter (Signed)
Patient called stating that he notices that there is a circular area to the left and inferior of the incision. He reports that this circular area is a dark purple and feels hard upon palpation. He additionally reports numbness and tingling in his left arm and 2 fingers on his left hand when he wakes up in the morning. He states that this numbness/tingling did not occur until after he increased his ROM post wound check on 7/2. Patient is available to see DC today at 11am.

## 2017-02-02 ENCOUNTER — Telehealth: Payer: Self-pay | Admitting: Cardiology

## 2017-02-02 NOTE — Telephone Encounter (Signed)
Patient called and wants to speak with you. Wouldn't allow me to help him said he's spoke w/ you.

## 2017-02-03 NOTE — Telephone Encounter (Signed)
Patient calling back. He stated that he is still having the numbness in his hand and he stated that the exercises are not working. He stated that he wakes up in the middle of the night in pain where circulation is been limited while he is lying down asleep. He wanted to know if he could use a hand grip strengthener to help with this issue.

## 2017-02-03 NOTE — Telephone Encounter (Signed)
Patient c/o ongoing L arm numbness since ppm implant. Patient denies L arm edema. Patient wanted to know if he could use his hand griper to see if it improved his sx's. I told patient that he could try to use the gripper to see if it helps, but I also offered him an appt to reassess sx's while GT is in the office.   Appt scheduled for 7/17 @ 1000. Patient verbalized understanding.

## 2017-02-07 ENCOUNTER — Ambulatory Visit (INDEPENDENT_AMBULATORY_CARE_PROVIDER_SITE_OTHER): Payer: Self-pay | Admitting: *Deleted

## 2017-02-07 DIAGNOSIS — R001 Bradycardia, unspecified: Secondary | ICD-10-CM

## 2017-02-07 NOTE — Progress Notes (Signed)
Patient seen today with c/o numbness in left arm and fingers when he lays down to go to sleep. GT encouraged patient that this would resolve with time. Patient encouraged to call back if symptoms worsen. Patient verbalized understanding.

## 2017-02-13 ENCOUNTER — Telehealth: Payer: Self-pay

## 2017-02-13 NOTE — Telephone Encounter (Signed)
Spoke with patient who called in and requested that I make a note in his charge that his shoulder and back pain post procedure has been relieved by a new sleeping position. He states that he now sleeps on his left side with a pillow between his knees and asks that I document this change in his chart.

## 2017-03-14 ENCOUNTER — Other Ambulatory Visit: Payer: Self-pay | Admitting: Family Medicine

## 2017-03-17 ENCOUNTER — Encounter (INDEPENDENT_AMBULATORY_CARE_PROVIDER_SITE_OTHER): Payer: Medicare Other | Admitting: Ophthalmology

## 2017-03-22 ENCOUNTER — Ambulatory Visit: Payer: Medicare Other | Admitting: Internal Medicine

## 2017-04-04 ENCOUNTER — Other Ambulatory Visit: Payer: Self-pay | Admitting: Family Medicine

## 2017-04-13 ENCOUNTER — Ambulatory Visit (INDEPENDENT_AMBULATORY_CARE_PROVIDER_SITE_OTHER): Payer: Medicare Other | Admitting: Internal Medicine

## 2017-04-13 ENCOUNTER — Encounter: Payer: Self-pay | Admitting: Internal Medicine

## 2017-04-13 VITALS — BP 190/102 | HR 66 | Ht 67.0 in | Wt 156.8 lb

## 2017-04-13 DIAGNOSIS — I442 Atrioventricular block, complete: Secondary | ICD-10-CM

## 2017-04-13 LAB — CUP PACEART INCLINIC DEVICE CHECK
Battery Remaining Longevity: 129 mo
Battery Voltage: 3.14 V
Brady Statistic AS VS Percent: 4.72 %
Implantable Lead Location: 753859
Implantable Lead Model: 5076
Implantable Pulse Generator Implant Date: 20180620
Lead Channel Impedance Value: 361 Ohm
Lead Channel Pacing Threshold Amplitude: 0.75 V
Lead Channel Pacing Threshold Pulse Width: 0.4 ms
Lead Channel Sensing Intrinsic Amplitude: 4.25 mV
Lead Channel Setting Pacing Amplitude: 2.5 V
Lead Channel Setting Pacing Pulse Width: 0.4 ms
MDC IDC LEAD IMPLANT DT: 20180620
MDC IDC LEAD IMPLANT DT: 20180620
MDC IDC LEAD LOCATION: 753860
MDC IDC MSMT LEADCHNL RA IMPEDANCE VALUE: 456 Ohm
MDC IDC MSMT LEADCHNL RA PACING THRESHOLD AMPLITUDE: 0.75 V
MDC IDC MSMT LEADCHNL RA PACING THRESHOLD PULSEWIDTH: 0.4 ms
MDC IDC MSMT LEADCHNL RV IMPEDANCE VALUE: 323 Ohm
MDC IDC MSMT LEADCHNL RV IMPEDANCE VALUE: 418 Ohm
MDC IDC MSMT LEADCHNL RV SENSING INTR AMPL: 6.875 mV
MDC IDC SESS DTM: 20180920140830
MDC IDC SET LEADCHNL RA PACING AMPLITUDE: 2 V
MDC IDC SET LEADCHNL RV SENSING SENSITIVITY: 2 mV
MDC IDC STAT BRADY AP VP PERCENT: 53.12 %
MDC IDC STAT BRADY AP VS PERCENT: 0.3 %
MDC IDC STAT BRADY AS VP PERCENT: 41.87 %
MDC IDC STAT BRADY RA PERCENT PACED: 53.16 %
MDC IDC STAT BRADY RV PERCENT PACED: 94.98 %

## 2017-04-13 NOTE — Progress Notes (Signed)
HPI [name] returns today for ongoing evaluation and management of complete heart block status post pacemaker insertion. The patient is a very pleasant 83 year old man with a remote history of SVT status post ablation. He subsequently developed progressive conduction system disease as well as sinus node dysfunction and underwent permanent pacemaker insertion approximately 3 months ago. At the time of his procedure, we had significant difficulty placing the RV/His bundle pacing lead. Ultimately we were successful. The patient developed numbness in his fingers postprocedure as well as a fairly substantial pacemaker pocket hematoma. The hematoma has resolved completely and the numbness has improved although still present. The patient admits to more energy, and denies syncope. No chest pain. He denies dietary indiscretion. Allergies  Allergen Reactions  . Codeine Nausea And Vomiting     Current Outpatient Prescriptions  Medication Sig Dispense Refill  . acetaminophen (TYLENOL) 650 MG CR tablet Take 650 mg by mouth daily as needed for pain.    Marland Kitchen amLODipine (NORVASC) 5 MG tablet TAKE 1 TABLET BY MOUTH  DAILY 90 tablet 1  . aspirin 81 MG tablet Take 162 mg by mouth daily.     . cetirizine (ZYRTEC) 10 MG tablet Take 10 mg by mouth at bedtime.     Marland Kitchen desonide (DESOWEN) 0.05 % cream APPLY TWO TIMES DAILY 15 g 1  . fluticasone (FLONASE) 50 MCG/ACT nasal spray USE 2 SPRAYS IN EACH  NOSTRIL DAILY 48 g 0  . ibuprofen (ADVIL,MOTRIN) 200 MG tablet Take 400 mg by mouth daily as needed (pain).    . Multiple Vitamins-Minerals (MULTIVITAMIN WITH MINERALS) tablet Take 1 tablet by mouth daily.      . Omega-3 Fatty Acids (FISH OIL) 1200 MG CAPS Take 1,200 mg by mouth daily.    Marland Kitchen omeprazole (PRILOSEC) 20 MG capsule TAKE 1 CAPSULE BY MOUTH  TWICE DAILY BEFORE MEALS (Patient taking differently: Takes one tablet every morning. Takes one tablet at bedtime as needed for acid reflux.) 180 capsule 2  . pravastatin  (PRAVACHOL) 40 MG tablet TAKE 1 TABLET BY MOUTH  DAILY 90 tablet 0  . glucosamine-chondroitin 500-400 MG tablet Take 1 tablet by mouth 3 (three) times daily.       No current facility-administered medications for this visit.      Past Medical History:  Diagnosis Date  . 1st degree AV block   . Arthritis    "hands, back, neck, shoulders, knees" (01/11/2017)  . Eczema   . GERD (gastroesophageal reflux disease)   . HH (hiatus hernia)   . History of blood transfusion    "maybe; w/prostate OR; they would have used my own" (01/11/2017)  . History of colonic polyps    "I don't think I've had any polyps" (01/11/2017)  . Hypercholesterolemia   . Hypertension   . Presence of permanent cardiac pacemaker   . Prostate cancer ([place]) 1997  . Seasonal allergies   . SVT (supraventricular tachycardia) (H[facility])     ROS:   All systems reviewed and negative except as noted in the HPI.   Past Surgical History:  Procedure Laterality Date  . AV NODE ABLATION  2000s X 2   AVNODE REENTRANT ABLATION  . CATARACT EXTRACTION W/ INTRAOCULAR LENS  IMPLANT, BILATERAL    . COLONOSCOPY  2007   [facility]  . INSERT / REPLACE / REMOVE PACEMAKER  01/11/2017  . PACEMAKER IMPLANT N/A 01/11/2017   Procedure: Pacemaker Implant;  Surgeon: Evans Lance, MD;  Location: [facility];  Service: Cardiovascular;  Laterality: N/A;  . PROSTATECTOMY    . TESTICLE SURGERY Left 2010   "benign growth"; [facility]  . TONSILLECTOMY       Family History  Problem Relation Age of Onset  . Cancer Mother   . Alcoholism Father      Social History   Social History  . Marital status: Married    Spouse name: N/A  . Number of children: N/A  . Years of education: N/A   Occupational History  . Not on file.   Social History Main Topics  . Smoking status: Former Smoker    Packs/day: 1.50    Years: 13.00    Types: Cigarettes    Quit date: 08/25/1962  . Smokeless tobacco: Never Used  . Alcohol use No  . Drug use: No  .  Sexual activity: No   Other Topics Concern  . Not on file   Social History Narrative   Originally from [place]     BP (!) 190/102   Pulse 66   Ht 5\' 7"  (1.702 m)   Wt 156 lb 12.8 oz (71.1 kg)   SpO2 97%   BMI 24.56 kg/m  Blood pressure 170/90 on my exam Physical Exam:  Well appearing 83 year old man, NAD HEENT: Unremarkable Neck:  6 cm JVD, no thyromegally Lymphatics:  No adenopathy Back:  No CVA tenderness Lungs:  Clear, with no wheezes, rales, or rhonchi well-healed pacemaker incision HEART:  Regular rate rhythm, no murmurs, no rubs, no clicks Abd:  soft, positive bowel sounds, no organomegally, no rebound, no guarding Ext:  2 plus pulses, no edema, no cyanosis, no clubbing Skin:  No rashes no nodules Neuro:  CN II through XII intact, motor grossly intact  EKG - normal sinus rhythm with AV sequential pacing  DEVICE  Normal device function.  See PaceArt for details.   Assess/Plan: 1. Complete heart block - he is now asymptomatic status post pacemaker insertion. He thinks his energy level is better. 2. Residual numbness in the third through fifth fingers - I suspect there were some neuropathic damaged during his pacemaker insertion. I have encouraged the patient that his symptoms will likely continue to improve. If they were to worsen, he is instructed to call us. 3. Hypertension - his blood pressure is elevated today. He notes that at home the blood pressure is not. He does not always check his blood pressure and I've encouraged him to do so. The patient is encouraged to maintain a low-sodium diet. I've offered increase his amlodipine today or to start a diuretic, but he refuses both stating that he will watch his diet better and check his blood pressure at home. If his blood pressure remains elevated, I've asked the patient to call us and we will add a diuretic to his therapy. He is scheduled to follow-up with his primary physician and he will check his blood pressure every day  and make a log. 4. Pacemaker - his Medtronic dual-chamber pacemaker is working normally. He will continue to follow-up in several months. No adjustments were made today in the programming.  [name], M.D.

## 2017-04-13 NOTE — Patient Instructions (Addendum)
Medication Instructions:  Your physician recommends that you continue on your current medications as directed. Please refer to the Current Medication list given to you today.  Labwork: None ordered.  Testing/Procedures: None ordered.  Follow-Up: Your physician wants you to follow-up in: 9 months with Dr. Lovena Le.   You will receive a reminder letter in the mail two months in advance. If you don't receive a letter, please call our office to schedule the follow-up appointment.  Remote monitoring is used to monitor your Pacemaker from home. This monitoring reduces the number of office visits required to check your device to one time per year. It allows [place] to keep an eye on the functioning of your device to ensure it is working properly. You are scheduled for a device check from home on 07/13/2017. You may send your transmission at any time that day. If you have a wireless device, the transmission will be sent automatically. After your physician reviews your transmission, you will receive a postcard with your next transmission date.    Any Other Special Instructions Will Be Listed Below (If Applicable).  Reduce your salt intake.  Get a blood pressure machine.  Check your blood pressure daily and write it down.    If you need a refill on your cardiac medications before your next appointment, please call your pharmacy.

## 2017-04-17 ENCOUNTER — Encounter (INDEPENDENT_AMBULATORY_CARE_PROVIDER_SITE_OTHER): Payer: Medicare Other | Admitting: Ophthalmology

## 2017-04-17 DIAGNOSIS — H348322 Tributary (branch) retinal vein occlusion, left eye, stable: Secondary | ICD-10-CM | POA: Diagnosis not present

## 2017-04-17 DIAGNOSIS — H43813 Vitreous degeneration, bilateral: Secondary | ICD-10-CM | POA: Diagnosis not present

## 2017-04-17 DIAGNOSIS — H35033 Hypertensive retinopathy, bilateral: Secondary | ICD-10-CM

## 2017-04-17 DIAGNOSIS — H33301 Unspecified retinal break, right eye: Secondary | ICD-10-CM

## 2017-04-17 DIAGNOSIS — I10 Essential (primary) hypertension: Secondary | ICD-10-CM

## 2017-04-17 DIAGNOSIS — D3131 Benign neoplasm of right choroid: Secondary | ICD-10-CM

## 2017-04-21 ENCOUNTER — Ambulatory Visit (INDEPENDENT_AMBULATORY_CARE_PROVIDER_SITE_OTHER): Payer: Medicare Other | Admitting: Family Medicine

## 2017-04-21 ENCOUNTER — Encounter: Payer: Self-pay | Admitting: Family Medicine

## 2017-04-21 VITALS — BP 172/90 | HR 68 | Ht 66.5 in | Wt 155.0 lb

## 2017-04-21 DIAGNOSIS — K449 Diaphragmatic hernia without obstruction or gangrene: Secondary | ICD-10-CM | POA: Diagnosis not present

## 2017-04-21 DIAGNOSIS — J309 Allergic rhinitis, unspecified: Secondary | ICD-10-CM

## 2017-04-21 DIAGNOSIS — Z9849 Cataract extraction status, unspecified eye: Secondary | ICD-10-CM | POA: Diagnosis not present

## 2017-04-21 DIAGNOSIS — I442 Atrioventricular block, complete: Secondary | ICD-10-CM

## 2017-04-21 DIAGNOSIS — I10 Essential (primary) hypertension: Secondary | ICD-10-CM | POA: Diagnosis not present

## 2017-04-21 DIAGNOSIS — M1990 Unspecified osteoarthritis, unspecified site: Secondary | ICD-10-CM | POA: Diagnosis not present

## 2017-04-21 DIAGNOSIS — Z8546 Personal history of malignant neoplasm of prostate: Secondary | ICD-10-CM

## 2017-04-21 DIAGNOSIS — Z950 Presence of cardiac pacemaker: Secondary | ICD-10-CM | POA: Diagnosis not present

## 2017-04-21 DIAGNOSIS — E785 Hyperlipidemia, unspecified: Secondary | ICD-10-CM

## 2017-04-21 LAB — CBC WITH DIFFERENTIAL/PLATELET
BASOS ABS: 43 {cells}/uL (ref 0–200)
Basophils Relative: 0.6 %
EOS ABS: 99 {cells}/uL (ref 15–500)
EOS PCT: 1.4 %
HCT: 49 % (ref 38.5–50.0)
Hemoglobin: 16.1 g/dL (ref 13.2–17.1)
Lymphs Abs: 639 cells/uL — ABNORMAL LOW (ref 850–3900)
MCH: 28.6 pg (ref 27.0–33.0)
MCHC: 32.9 g/dL (ref 32.0–36.0)
MCV: 87.2 fL (ref 80.0–100.0)
MONOS PCT: 7.4 %
MPV: 9.9 fL (ref 7.5–12.5)
NEUTROS ABS: 5794 {cells}/uL (ref 1500–7800)
NEUTROS PCT: 81.6 %
PLATELETS: 186 10*3/uL (ref 140–400)
RBC: 5.62 10*6/uL (ref 4.20–5.80)
RDW: 12 % (ref 11.0–15.0)
TOTAL LYMPHOCYTE: 9 %
WBC mixed population: 525 cells/uL (ref 200–950)
WBC: 7.1 10*3/uL (ref 3.8–10.8)

## 2017-04-21 LAB — COMPREHENSIVE METABOLIC PANEL
AG RATIO: 1.8 (calc) (ref 1.0–2.5)
ALT: 19 U/L (ref 9–46)
AST: 22 U/L (ref 10–35)
Albumin: 4.6 g/dL (ref 3.6–5.1)
Alkaline phosphatase (APISO): 111 U/L (ref 40–115)
BUN: 11 mg/dL (ref 7–25)
CO2: 27 mmol/L (ref 20–32)
Calcium: 9.6 mg/dL (ref 8.6–10.3)
Chloride: 103 mmol/L (ref 98–110)
Creat: 0.98 mg/dL (ref 0.70–1.11)
GLUCOSE: 100 mg/dL — AB (ref 65–99)
Globulin: 2.6 g/dL (calc) (ref 1.9–3.7)
Potassium: 4.3 mmol/L (ref 3.5–5.3)
SODIUM: 139 mmol/L (ref 135–146)
TOTAL PROTEIN: 7.2 g/dL (ref 6.1–8.1)
Total Bilirubin: 0.6 mg/dL (ref 0.2–1.2)

## 2017-04-21 LAB — LIPID PANEL
Cholesterol: 195 mg/dL (ref <200)
HDL: 64 mg/dL (ref >40)
LDL Cholesterol (Calc): 109 mg/dL (calc) — ABNORMAL HIGH
NON-HDL CHOLESTEROL (CALC): 131 mg/dL — AB (ref <130)
TRIGLYCERIDES: 117 mg/dL (ref <150)
Total CHOL/HDL Ratio: 3 (calc) (ref <5.0)

## 2017-04-21 MED ORDER — PRAVASTATIN SODIUM 40 MG PO TABS: 40.0000 mg | ORAL_TABLET | Freq: Every day | ORAL | 3 refills | Status: DC

## 2017-04-21 MED ORDER — HYDROCHLOROTHIAZIDE 12.5 MG PO TABS: 12.5000 mg | ORAL_TABLET | Freq: Every day | ORAL | 3 refills | Status: DC

## 2017-04-21 MED ORDER — OMEPRAZOLE 20 MG PO CPDR: DELAYED_RELEASE_CAPSULE | ORAL | 3 refills | Status: DC

## 2017-04-21 NOTE — Progress Notes (Signed)
[name] is a 83 y.o. male who presents for annual wellness visit and follow-up on chronic medical conditions.  He has the following concerns: He now has a pacemaker in place. Apparently they did have difficulty technically getting the pacemaker to work but he is now having no difficulty with that. He has had difficulty after the pacemaker with a change in his bowel habits.presently he is using MiraLAX as well as Colace and getting good results with that. He continues on Prilosec for his underlying reflux disease. He does have arthritis mainly in the right knee and has had injections. The last one was over 8 months ago and he is doing quite nicely from that.His allergies are under good control. Continues on Pravachol for his lipids. Lately his blood pressure has been elevated and when he was last seen by cardiology who recommended increasing his medication.  he continues on one baby aspirin per day.he has a previous history of prostate cancer. He is also had bilateral cataract surgery. Immunizations and Health Maintenance Immunization History  Administered Date(s) Administered  . Pneumococcal Conjugate-13 01/20/2014  . Pneumococcal Polysaccharide-23 01/03/2012  . Tdap 05/25/2000, 01/03/2012   There are no preventive care reminders to display for this patient.  Last colonoscopy:n/a Last PSA:n/a Dentist:Dr. Eliezer Ashley Ophtho:Dr. Herbert Ashley Exercise:keeps himself physically active.  Other doctors caring for patient include:[facility]/dermatology.Xu/orthopedics  Advanced Directives:o. Information given.    Depression screen:  See questionnaire below.     Depression screen [facility] 2/9 04/21/2017 03/14/2016 03/10/2015 01/20/2014 01/15/2013  Decreased Interest 0 0 0 0 0  Down, Depressed, Hopeless 0 0 0 0 0  PHQ - 2 Score 0 0 0 0 0    Fall Screen: See Questionaire below.   Fall Risk  04/21/2017 03/14/2016 03/10/2015 01/20/2014 01/15/2013  Falls in the past year? No No No No No    ADL screen:  See  questionnaire below.  Functional Status Survey: Is the patient deaf or have difficulty hearing?: Yes (hearing aids) Does the patient have difficulty seeing, even when wearing glasses/contacts?: No Does the patient have difficulty concentrating, remembering, or making decisions?: No Does the patient have difficulty walking or climbing stairs?: No Does the patient have difficulty dressing or bathing?: No Does the patient have difficulty doing errands alone such as visiting a doctor's office or shopping?: No   Review of Systems  negative except as above   PHYSICAL EXAM:  BP (!) 172/90 (BP Location: Left Arm, Patient Position: Sitting, Cuff Size: Normal)   Pulse 68   Ht 5' 6.5" (1.689 m)   Wt 155 lb (70.3 kg)   BMI 24.64 kg/m   General Appearance: Alert, cooperative, no distress, appears stated age Head: Normocephalic, without obvious abnormality, atraumatic Eyes: PERRL, conjunctiva/corneas clear, EOM's intact, fundi benign Ears: Normal TM's and external ear canals Nose: Nares normal, mucosa normal, no drainage or sinus   tenderness Throat: Lips, mucosa, and tongue normal; teeth and gums normal Neck: Supple, no lymphadenopathy, thyroid:no enlargement/tenderness/nodules; no carotid bruit or JVD Lungs: Clear to auscultation bilaterally without wheezes, rales or ronchi; respirations unlabored Heart: irregular rhythm, S1 and S2 normal, no murmur, rub or gallop Abdomen: Soft, non-tender, nondistended, normoactive bowel sounds, no masses, no hepatosplenomegaly Extremities: No clubbing, cyanosis or edema Pulses: 2+ and symmetric all extremities Skin: Skin color, texture, turgor normal, no rashes or lesions Lymph nodes: Cervical, supraclavicular, and axillary nodes normal Neurologic: CNII-XII intact, normal strength, sensation and gait; reflexes 2+ and symmetric throughout   Psych: Normal mood, affect, hygiene and grooming  ASSESSMENT/PLAN: Essential hypertension - Plan:  hydrochlorothiazide (HYDRODIURIL) 12.5 MG tablet, CBC with Differential/Platelet, Comprehensive metabolic panel, Lipid panel  Pacemaker  Complete heart block (HCC) - Plan: CBC with Differential/Platelet, Comprehensive metabolic panel, Lipid panel  Hyperlipidemia LDL goal <130 - Plan: Lipid panel  History of cataract extraction, unspecified laterality  History of prostate cancer  Allergic rhinitis, unspecified seasonality, unspecified trigger  Arthritis  HH (hiatus hernia) I will continue him on his present medication regimen and add HCTZ to his regimen. He will return here in one month for blood pressure recheck and weight has prepped pressure cuff with him.he will also continue to follow-up with orthopedics concerning his arthritis/knee problems. He will also continue to use Colace or MiraLAX to help with his bowel habits.   Medicare Attestation I have personally reviewed: The patient's medical and social history Their use of alcohol, tobacco or illicit drugs Their current medications and supplements The patient's functional ability including ADLs,fall risks, home safety risks, cognitive, and hearing and visual impairment Diet and physical activities Evidence for depression or mood disorders  The patient's weight, height, and BMI have been recorded in the chart.  I have made referrals, counseling, and provided education to the patient based on review of the above and I have provided the patient with a written personalized care plan for preventive services.     Joseph Haste, MD   04/21/2017

## 2017-05-22 ENCOUNTER — Telehealth: Payer: Self-pay | Admitting: Family Medicine

## 2017-05-22 ENCOUNTER — Other Ambulatory Visit: Payer: Self-pay | Admitting: *Deleted

## 2017-05-22 ENCOUNTER — Encounter: Payer: Self-pay | Admitting: Family Medicine

## 2017-05-22 ENCOUNTER — Ambulatory Visit (INDEPENDENT_AMBULATORY_CARE_PROVIDER_SITE_OTHER): Payer: Medicare Other | Admitting: Family Medicine

## 2017-05-22 VITALS — BP 158/92 | HR 80 | Wt 158.6 lb

## 2017-05-22 DIAGNOSIS — K5900 Constipation, unspecified: Secondary | ICD-10-CM

## 2017-05-22 DIAGNOSIS — I10 Essential (primary) hypertension: Secondary | ICD-10-CM

## 2017-05-22 DIAGNOSIS — Z950 Presence of cardiac pacemaker: Secondary | ICD-10-CM

## 2017-05-22 MED ORDER — HYDROCHLOROTHIAZIDE 12.5 MG PO TABS: 12.5000 mg | ORAL_TABLET | Freq: Every day | ORAL | 1 refills | Status: DC

## 2017-05-22 NOTE — Telephone Encounter (Signed)
Done. Durene Cal to [facility] for 90 days with 1 refill.

## 2017-05-22 NOTE — Telephone Encounter (Signed)
I will call that medicine in. Let him know.

## 2017-05-22 NOTE — Telephone Encounter (Signed)
Pt called and he forgot to ask if you him want to continue taking the RX hydrochlorothiazide and if you want him to continue taking this please send him in a 90 day supply to the [facility] ([facility] [place] [address] AT [phone] TO LET HE KNOW WHAT YOU DECIDE TO DO,

## 2017-05-22 NOTE — Telephone Encounter (Signed)
Left a message letting pt know that pt was going to send in rx for him

## 2017-05-22 NOTE — Progress Notes (Signed)
   Subjective:    Patient ID: [name], male    DOB: [birth year], 83 y.o.   MRN: [id number]  HPI He is here for a recheck. He now has a pacemaker. He has been checking his blood pressure at home and did bring the readings in. He also is been having difficulty with constipation and presently is on MiraLAX. He has questions concerning continued use of this. He is using half dosing and getting good results.   Review of Systems     Objective:   Physical Exam Alert and in no distress. Blood pressure impairing his machine to our shows a 20/10 differential higher on his machine than ours.       Assessment & Plan:  Pacemaker  Essential hypertension  Constipation, unspecified constipation type I explained that the variation in his blood pressure is also because of his irregular heart rate due to his pacer. He will keep track of his blood pressure at home based on the differential described above. Recommend he continue to use MiraLAX on a half dosing regimen is a seems to be working well to keep him regular.

## 2017-05-22 NOTE — Telephone Encounter (Signed)
  patient called back about message he received stating that hctz was sent to his pharmacy  Pt states that it is supposed to be sent to his [place]

## 2017-05-23 ENCOUNTER — Other Ambulatory Visit: Payer: Self-pay

## 2017-05-23 DIAGNOSIS — I10 Essential (primary) hypertension: Secondary | ICD-10-CM

## 2017-05-23 MED ORDER — HYDROCHLOROTHIAZIDE 12.5 MG PO TABS: 12.5000 mg | ORAL_TABLET | Freq: Every day | ORAL | 1 refills | Status: DC

## 2017-05-23 NOTE — Telephone Encounter (Signed)
Done

## 2017-05-27 IMAGING — CR DG CHEST 2V
2 series · 2 of 2 positions shown · non-contrast
Comparison: PA and lateral chest of September 24, 2009

CLINICAL DATA: One week of cough and hemoptysis, newly discovered
heart murmur.

EXAM:
CHEST  2 VIEW

[view not recorded (1 of 2)]
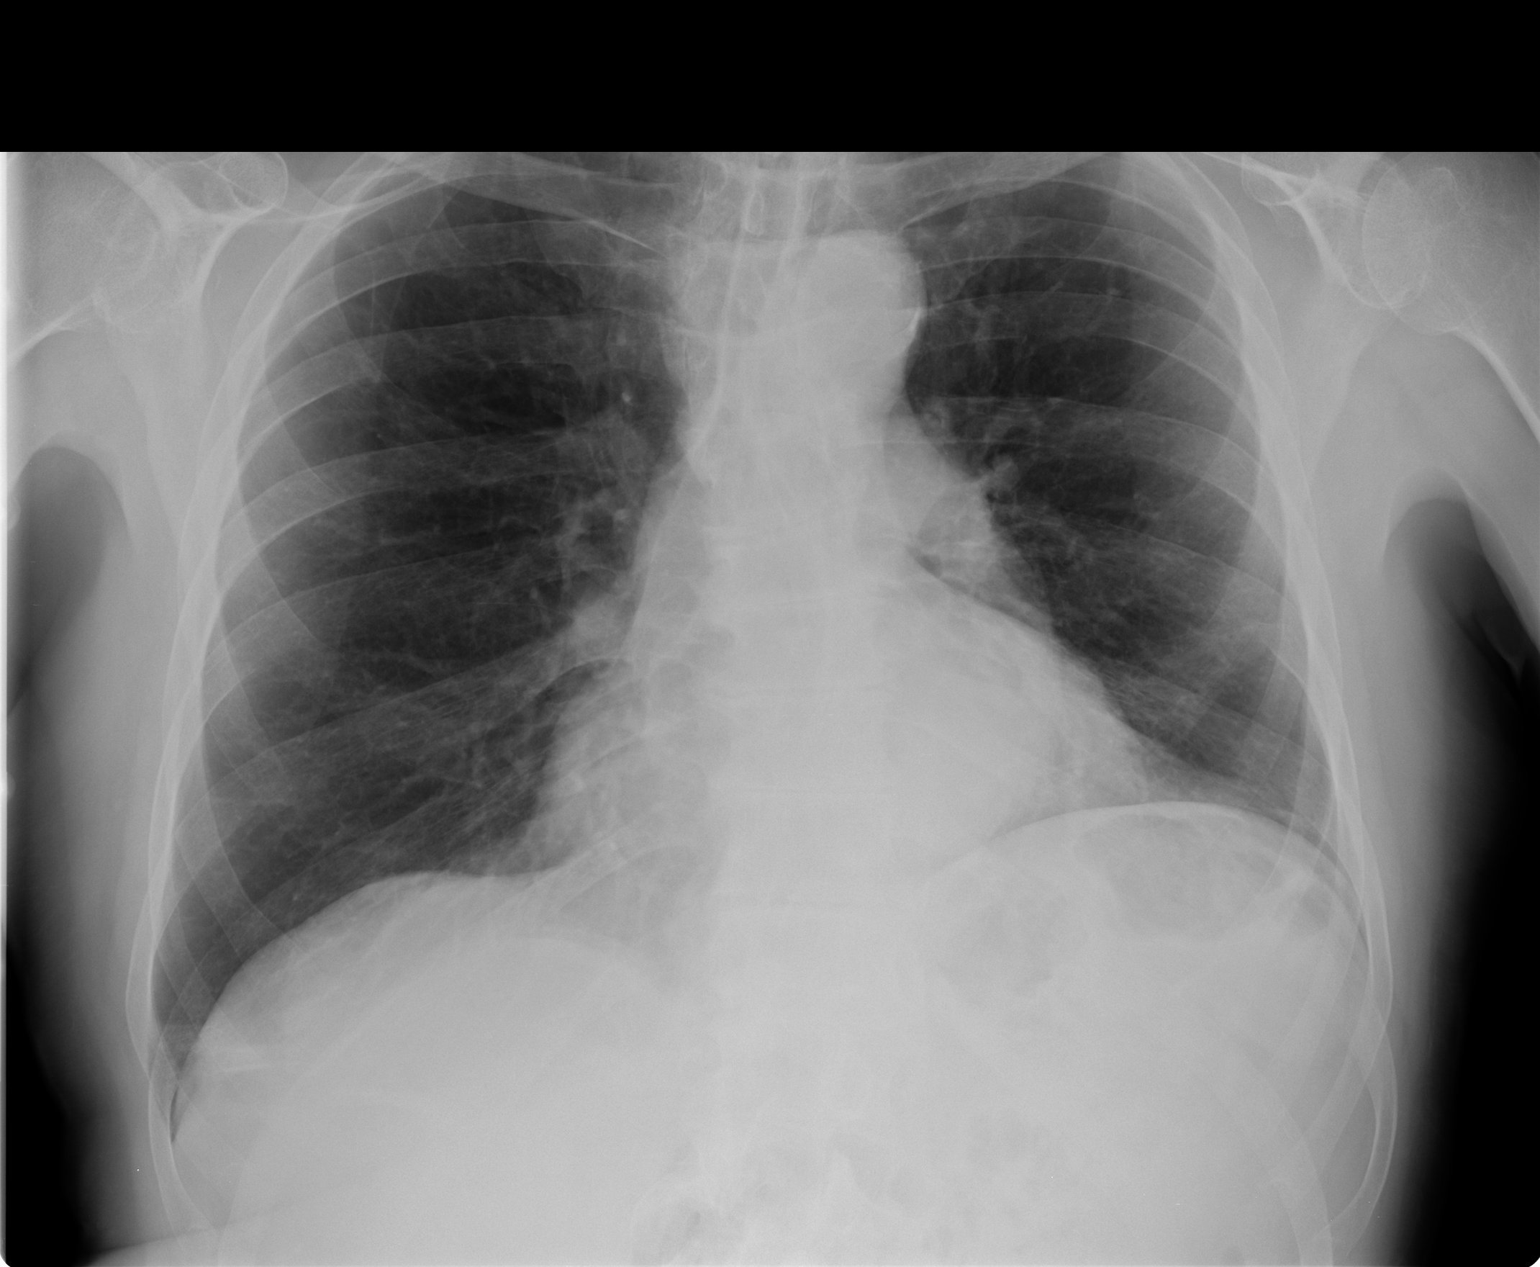

[view not recorded (2 of 2)]
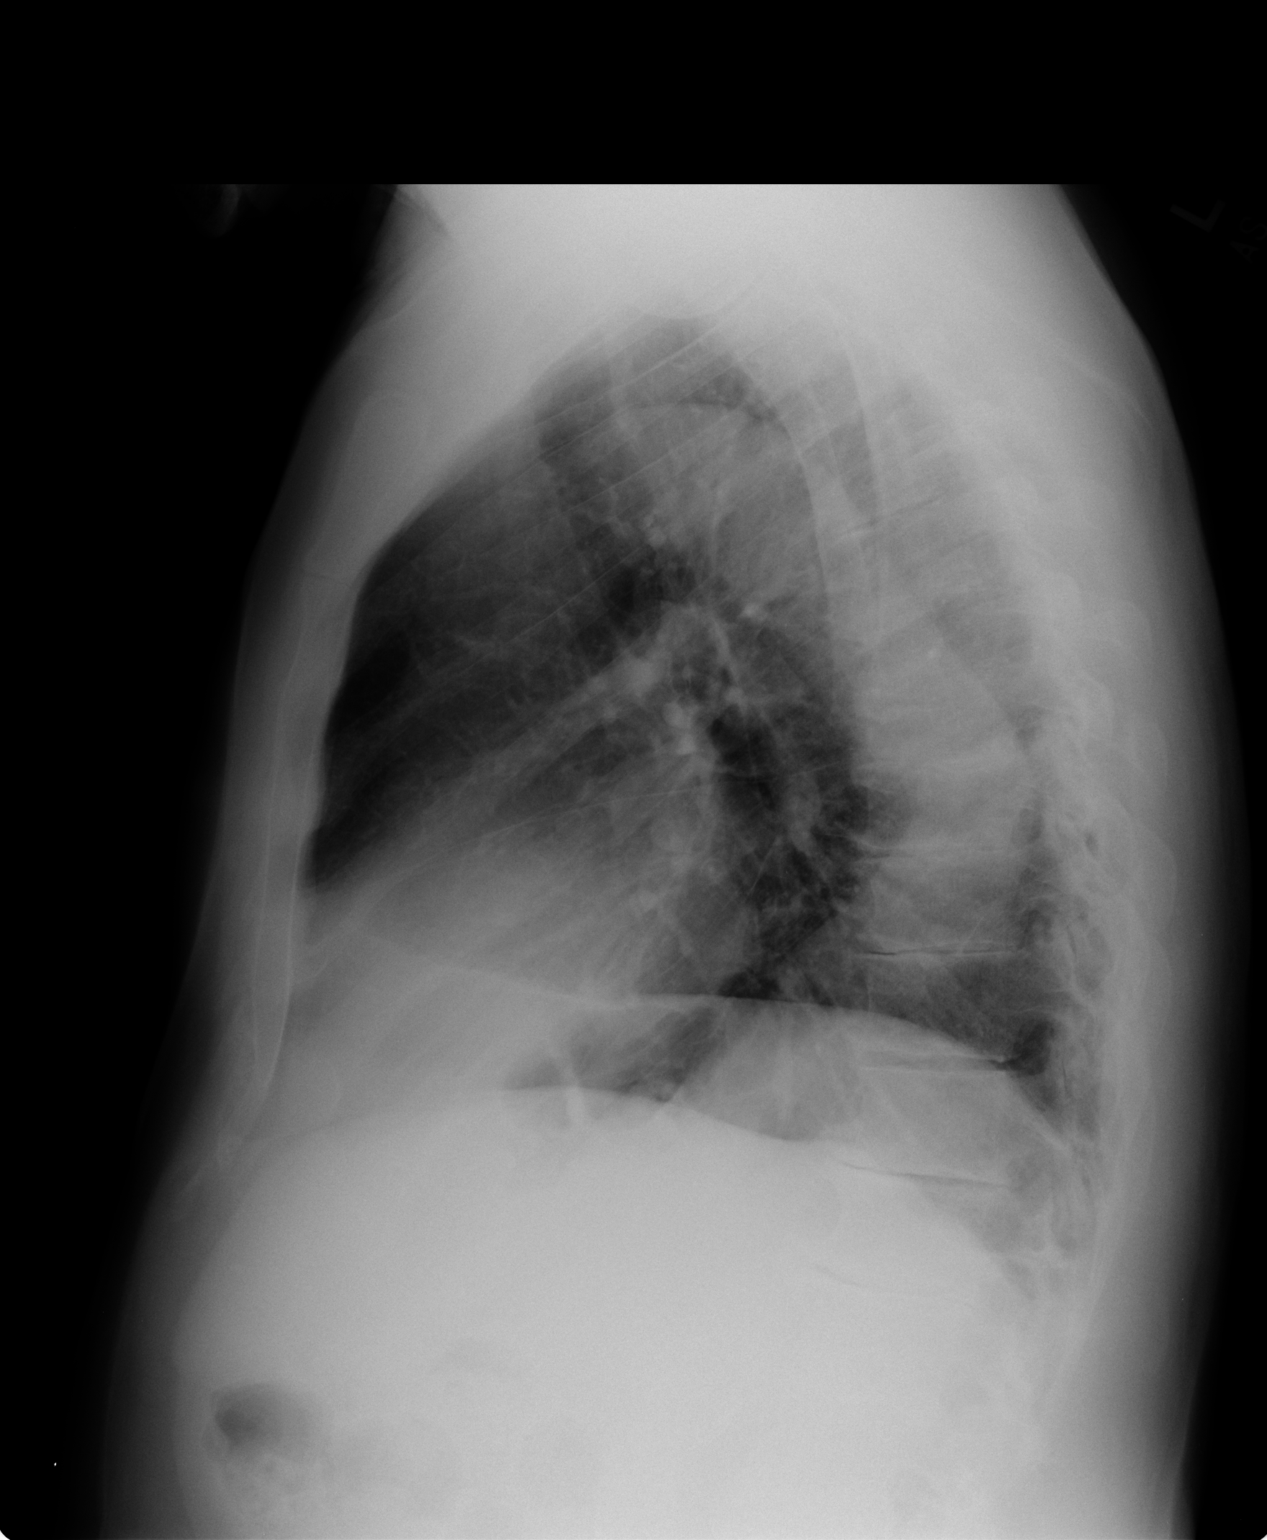

[2 of 2 positions shown; findings below may reference images not displayed]

FINDINGS: The lungs are adequately inflated. There is no focal infiltrate.
There is no pleural effusion. The heart is normal in size. The
pulmonary vascularity is normal. There is tortuosity of the
descending thoracic aorta. There is multilevel degenerative disc
space narrowing of the thoracic spine.
IMPRESSION: 1. There is no evidence of CHF or pneumonia.
2. Marked tortuosity of the descending thoracic aorta.

## 2017-05-31 ENCOUNTER — Telehealth: Payer: Self-pay | Admitting: Family Medicine

## 2017-05-31 NOTE — Telephone Encounter (Signed)
LMOM--advised him that since he is also on HCTZ, to be sure that he is staying well hydrated.  He may have slight orthostatic symptoms related to the diuretic, rather than it being from the amlodipine (which he has been on).   If having ongoing issues, give a call back to the office tomorrow.

## 2017-05-31 NOTE — Telephone Encounter (Signed)
Pt called and would like a call he has had 2 episodes of getting lightheaded after taking his blood pressure medicine amlodipine, states the first one was last Wednesday and the other one was today, he took the medicine and brushed his teeth and shaved both times and then he got light headed right after, he thinks he is having a reaction to the medicine even though he has been taking the medicine for a while, he would like dr Redmond School to call him and talk to him,about this or dr Tomi Bamberger, I informed him that dr Redmond School was out of the office today, pt can be reached at [phone] he would like me to send to dr knapp also per his request since dr Redmond School was not in the office,

## 2017-06-01 ENCOUNTER — Telehealth: Payer: Self-pay | Admitting: Family Medicine

## 2017-06-01 NOTE — Telephone Encounter (Signed)
Pt would like you to call him regarding his blood pressure and a problem he is having, said he really would like to talk to you personally

## 2017-07-08 ENCOUNTER — Other Ambulatory Visit: Payer: Self-pay | Admitting: Family Medicine

## 2017-07-11 ENCOUNTER — Ambulatory Visit (INDEPENDENT_AMBULATORY_CARE_PROVIDER_SITE_OTHER): Payer: Medicare Other | Admitting: Orthopaedic Surgery

## 2017-07-11 ENCOUNTER — Ambulatory Visit (INDEPENDENT_AMBULATORY_CARE_PROVIDER_SITE_OTHER): Payer: Medicare Other

## 2017-07-11 ENCOUNTER — Encounter (INDEPENDENT_AMBULATORY_CARE_PROVIDER_SITE_OTHER): Payer: Self-pay | Admitting: Orthopaedic Surgery

## 2017-07-11 DIAGNOSIS — G8929 Other chronic pain: Secondary | ICD-10-CM

## 2017-07-11 DIAGNOSIS — M25561 Pain in right knee: Secondary | ICD-10-CM

## 2017-07-11 DIAGNOSIS — M1711 Unilateral primary osteoarthritis, right knee: Secondary | ICD-10-CM

## 2017-07-11 MED ORDER — METHYLPREDNISOLONE ACETATE 40 MG/ML IJ SUSP
40.0000 mg | INTRAMUSCULAR | Status: AC | PRN
  Administered 2017-07-11: 40 mg via INTRA_ARTICULAR

## 2017-07-11 MED ORDER — LIDOCAINE HCL 1 % IJ SOLN
2.0000 mL | INTRAMUSCULAR | Status: AC | PRN
  Administered 2017-07-11: 2 mL

## 2017-07-11 MED ORDER — BUPIVACAINE HCL 0.5 % IJ SOLN
2.0000 mL | INTRAMUSCULAR | Status: AC | PRN
  Administered 2017-07-11: 2 mL via INTRA_ARTICULAR

## 2017-07-11 NOTE — Progress Notes (Signed)
Office Visit Note   Patient: [name]           Date of Birth: February 12, 1932           MRN: [id number] Visit Date: 07/11/2017              Requested by: Denita Lung, MD [place], [place] [address] PCP: Denita Lung, MD   Assessment & Plan: Visit Diagnoses:  1. Chronic pain of right knee   2. Unilateral primary osteoarthritis, right knee     Plan: Right knee degenerative joint disease but clinically well compensated through conservative treatment.  Cortisone injection performed today.  We will submit application for Visco supplementation.  Follow-up as needed for the injection  Follow-Up Instructions: Return if symptoms worsen or fail to improve.   Orders:  Orders Placed This Encounter  Procedures  . XR KNEE 3 VIEW RIGHT   No orders of the defined types were placed in this encounter.     Procedures: Large Joint Inj: R knee on 07/11/2017 10:57 AM Indications: pain Details: 22 G needle  Arthrogram: No  Medications: 40 mg methylPREDNISolone acetate 40 MG/ML; 2 mL lidocaine 1 %; 2 mL bupivacaine 0.5 % Consent was given by the patient. Patient was prepped and draped in the usual sterile fashion.       Clinical Data: No additional findings.   Subjective: Chief Complaint  Patient presents with  . Right Knee - Pain    Patient follows up today for his right knee pain.  He had good relief from a previous cortisone injection.  He is requesting another one.  He is also requesting to try Visco supplementation injections.  Patient did undergo a pacemaker placement 6 months ago.    Review of Systems   Objective: Vital Signs: There were no vitals taken for this visit.  Physical Exam  Ortho Exam Right knee exam shows a trace effusion.  Collaterals and cruciates are stable.  Impression is advanced Specialty Comments:  No specialty comments available.  Imaging: Xr Knee 3 View Right  Result Date: 07/11/2017 DJD worst in medial  compartment    PMFS History: Patient Active Problem List   Diagnosis Date Noted  . Pacemaker 04/21/2017  . Complete heart block ([place]) 01/11/2017  . Acute pain of left shoulder 08/02/2016  . History of prostate cancer 12/27/2010  . Arthritis 12/27/2010  . Hx of cataract surgery 12/27/2010  . HH (hiatus hernia)   . History of colonic polyps   . Hyperlipidemia LDL goal <130 04/21/2008  . Allergic rhinitis 04/21/2008  . Essential hypertension 04/18/2008   Past Medical History:  Diagnosis Date  . 1st degree AV block   . Arthritis    "hands, back, neck, shoulders, knees" (01/11/2017)  . Eczema   . GERD (gastroesophageal reflux disease)   . HH (hiatus hernia)   . History of blood transfusion    "maybe; w/prostate OR; they would have used my own" (01/11/2017)  . History of colonic polyps    "I don't think I've had any polyps" (01/11/2017)  . Hypercholesterolemia   . Hypertension   . Presence of permanent cardiac pacemaker   . Prostate cancer ([place]) 1997  . Seasonal allergies   . SVT (supraventricular tachycardia) (HCC)     Family History  Problem Relation Age of Onset  . Cancer Mother   . Alcoholism Father     Past Surgical History:  Procedure Laterality Date  . AV NODE ABLATION  2000s X  2   AVNODE REENTRANT ABLATION  . CATARACT EXTRACTION W/ INTRAOCULAR LENS  IMPLANT, BILATERAL    . COLONOSCOPY  2007   MAGOD  . INSERT / REPLACE / REMOVE PACEMAKER  01/11/2017  . PACEMAKER IMPLANT N/A 01/11/2017   Procedure: Pacemaker Implant;  Surgeon: Evans Lance, MD;  Location: [facility];  Service: Cardiovascular;  Laterality: N/A;  . PROSTATECTOMY    . TESTICLE SURGERY Left 2010   "benign growth"; [facility]  . TONSILLECTOMY     Social History   Occupational History  . Not on file  Tobacco Use  . Smoking status: Former Smoker    Packs/day: 1.50    Years: 13.00    Pack years: 19.50    Types: Cigarettes    Last attempt to quit: 08/25/1962    Years since quitting:  54.9  . Smokeless tobacco: Never Used  Substance and Sexual Activity  . Alcohol use: No  . Drug use: No  . Sexual activity: No

## 2017-07-12 ENCOUNTER — Other Ambulatory Visit: Payer: Self-pay | Admitting: Family Medicine

## 2017-07-12 DIAGNOSIS — E785 Hyperlipidemia, unspecified: Secondary | ICD-10-CM

## 2017-07-13 ENCOUNTER — Telehealth: Payer: Self-pay | Admitting: Cardiology

## 2017-07-13 ENCOUNTER — Ambulatory Visit (INDEPENDENT_AMBULATORY_CARE_PROVIDER_SITE_OTHER): Payer: Medicare Other | Admitting: *Deleted

## 2017-07-13 DIAGNOSIS — I442 Atrioventricular block, complete: Secondary | ICD-10-CM | POA: Diagnosis not present

## 2017-07-13 NOTE — Telephone Encounter (Signed)
Confirmed remote transmission w/ pt wife.   

## 2017-07-14 ENCOUNTER — Encounter: Payer: Self-pay | Admitting: Cardiology

## 2017-07-14 NOTE — Progress Notes (Signed)
Remote pacemaker transmission.   

## 2017-07-22 LAB — CUP PACEART REMOTE DEVICE CHECK
Battery Voltage: 3.1 V
Brady Statistic AP VP Percent: 52.72 %
Brady Statistic AS VP Percent: 43.86 %
Brady Statistic AS VS Percent: 3.19 %
Brady Statistic RA Percent Paced: 52.81 %
Brady Statistic RV Percent Paced: 96.58 %
Implantable Lead Implant Date: 20180620
Implantable Lead Implant Date: 20180620
Implantable Lead Location: 753860
Implantable Lead Model: 5076
Implantable Pulse Generator Implant Date: 20180620
Lead Channel Impedance Value: 323 Ohm
Lead Channel Impedance Value: 323 Ohm
Lead Channel Impedance Value: 399 Ohm
Lead Channel Impedance Value: 418 Ohm
Lead Channel Pacing Threshold Amplitude: 0.625 V
Lead Channel Pacing Threshold Amplitude: 0.625 V
Lead Channel Pacing Threshold Pulse Width: 0.4 ms
Lead Channel Pacing Threshold Pulse Width: 0.4 ms
Lead Channel Setting Pacing Amplitude: 1.5 V
Lead Channel Setting Sensing Sensitivity: 2 mV
MDC IDC LEAD LOCATION: 753859
MDC IDC MSMT BATTERY REMAINING LONGEVITY: 127 mo
MDC IDC MSMT LEADCHNL RA SENSING INTR AMPL: 5.25 mV
MDC IDC MSMT LEADCHNL RA SENSING INTR AMPL: 5.25 mV
MDC IDC MSMT LEADCHNL RV SENSING INTR AMPL: 3.375 mV
MDC IDC MSMT LEADCHNL RV SENSING INTR AMPL: 3.375 mV
MDC IDC SESS DTM: 20181220205426
MDC IDC SET LEADCHNL RV PACING AMPLITUDE: 2.5 V
MDC IDC SET LEADCHNL RV PACING PULSEWIDTH: 0.4 ms
MDC IDC STAT BRADY AP VS PERCENT: 0.24 %

## 2017-07-27 DIAGNOSIS — H40013 Open angle with borderline findings, low risk, bilateral: Secondary | ICD-10-CM | POA: Diagnosis not present

## 2017-07-27 DIAGNOSIS — H348122 Central retinal vein occlusion, left eye, stable: Secondary | ICD-10-CM | POA: Diagnosis not present

## 2017-07-27 DIAGNOSIS — H35362 Drusen (degenerative) of macula, left eye: Secondary | ICD-10-CM | POA: Diagnosis not present

## 2017-07-27 DIAGNOSIS — H35033 Hypertensive retinopathy, bilateral: Secondary | ICD-10-CM | POA: Diagnosis not present

## 2017-07-31 ENCOUNTER — Encounter (INDEPENDENT_AMBULATORY_CARE_PROVIDER_SITE_OTHER): Payer: Medicare Other | Admitting: Ophthalmology

## 2017-08-07 ENCOUNTER — Encounter (INDEPENDENT_AMBULATORY_CARE_PROVIDER_SITE_OTHER): Payer: Medicare Other | Admitting: Ophthalmology

## 2017-08-15 ENCOUNTER — Telehealth: Payer: Self-pay | Admitting: Family Medicine

## 2017-08-15 DIAGNOSIS — I10 Essential (primary) hypertension: Secondary | ICD-10-CM

## 2017-08-15 MED ORDER — HYDROCHLOROTHIAZIDE 12.5 MG PO TABS: 12.5000 mg | ORAL_TABLET | Freq: Every day | ORAL | 0 refills | Status: DC

## 2017-08-15 MED ORDER — AMLODIPINE BESYLATE 5 MG PO TABS: 5.0000 mg | ORAL_TABLET | Freq: Every day | ORAL | 0 refills | Status: DC

## 2017-08-15 NOTE — Telephone Encounter (Signed)
Done

## 2017-08-15 NOTE — Telephone Encounter (Signed)
Pt called requesting that Amlodipine & Hydrochlorothiazide scripts be sent to [facility] at [facility]

## 2017-08-30 ENCOUNTER — Telehealth: Payer: Self-pay | Admitting: Family Medicine

## 2017-08-30 MED ORDER — FLUTICASONE PROPIONATE 50 MCG/ACT NA SUSP: 2.0000 | Freq: Every day | NASAL | 0 refills | Status: DC

## 2017-08-30 NOTE — Telephone Encounter (Signed)
Done

## 2017-08-30 NOTE — Telephone Encounter (Signed)
Pt needs refill Fluticasone to new mail order [facility] order

## 2017-09-07 ENCOUNTER — Ambulatory Visit (INDEPENDENT_AMBULATORY_CARE_PROVIDER_SITE_OTHER): Payer: Medicare HMO | Admitting: Family Medicine

## 2017-09-07 ENCOUNTER — Encounter: Payer: Self-pay | Admitting: Family Medicine

## 2017-09-07 VITALS — BP 138/86 | HR 80 | Wt 159.0 lb

## 2017-09-07 DIAGNOSIS — K5900 Constipation, unspecified: Secondary | ICD-10-CM | POA: Diagnosis not present

## 2017-09-07 DIAGNOSIS — J309 Allergic rhinitis, unspecified: Secondary | ICD-10-CM | POA: Diagnosis not present

## 2017-09-07 NOTE — Patient Instructions (Signed)
Try Claritin to see if that will help and if not call me and I will call in the new nasal steroid spray that you will add to the fluticasone

## 2017-09-07 NOTE — Progress Notes (Signed)
   Subjective:    Patient ID: [name], male    DOB: October 25, 1931, 84 y.o.   MRN: [id number]  HPI He is here for consult concerning his allergies.  He notes difficulty especially when he lies down with postnasal drainage as well as rhinorrhea.  He is on Flonase and has tried Zyrtec in the past.  He also has a history of constipation and did find some relief with MiraLAX but is also been using herbal preparation.   Review of Systems     Objective:   Physical Exam  Alert and in no distress otherwise not examined      Assessment & Plan:  Constipation, unspecified constipation type  Allergic rhinitis, unspecified seasonality, unspecified trigger Recommend he continue on the Flonase and then possibly switch to Claritin and if continued difficulty with allergy symptoms, I will call in Astelin for him.  He was comfortable with that and will keep me informed. Discussed his constipation and recommend that he continue to regularly use MiraLAX.  I stated that there is no problem with continuing on this on a regular basis to keep his stools soft.

## 2017-09-12 ENCOUNTER — Telehealth: Payer: Self-pay | Admitting: Family Medicine

## 2017-09-12 NOTE — Telephone Encounter (Signed)
Pt requesting current scripts and refills on Pravastatin & Desonide be sent to [facility] at [place]

## 2017-09-13 ENCOUNTER — Other Ambulatory Visit: Payer: Self-pay

## 2017-09-13 DIAGNOSIS — E785 Hyperlipidemia, unspecified: Secondary | ICD-10-CM

## 2017-09-13 MED ORDER — DESONIDE 0.05 % EX CREA: TOPICAL_CREAM | CUTANEOUS | 1 refills | Status: DC

## 2017-09-13 MED ORDER — PRAVASTATIN SODIUM 40 MG PO TABS: 40.0000 mg | ORAL_TABLET | Freq: Every day | ORAL | 3 refills | Status: DC

## 2017-09-13 NOTE — Progress Notes (Signed)
Pt was called to let him know med were sent to [facility]. thanks kh

## 2017-09-21 ENCOUNTER — Telehealth (INDEPENDENT_AMBULATORY_CARE_PROVIDER_SITE_OTHER): Payer: Self-pay | Admitting: Orthopaedic Surgery

## 2017-09-21 NOTE — Telephone Encounter (Signed)
If so, for how long.

## 2017-09-21 NOTE — Telephone Encounter (Signed)
1year

## 2017-09-21 NOTE — Telephone Encounter (Signed)
Pt is requesting a handicap place card

## 2017-09-22 NOTE — Telephone Encounter (Signed)
Form is ready for pick up at the front desk 

## 2017-10-12 ENCOUNTER — Telehealth: Payer: Self-pay | Admitting: Cardiology

## 2017-10-12 ENCOUNTER — Ambulatory Visit (INDEPENDENT_AMBULATORY_CARE_PROVIDER_SITE_OTHER): Payer: Medicare HMO | Admitting: *Deleted

## 2017-10-12 DIAGNOSIS — I442 Atrioventricular block, complete: Secondary | ICD-10-CM | POA: Diagnosis not present

## 2017-10-12 NOTE — Telephone Encounter (Signed)
LMOVM reminding pt to send remote transmission.   

## 2017-10-13 ENCOUNTER — Encounter: Payer: Self-pay | Admitting: Cardiology

## 2017-10-13 NOTE — Progress Notes (Signed)
Remote pacemaker transmission.   

## 2017-10-26 ENCOUNTER — Encounter (INDEPENDENT_AMBULATORY_CARE_PROVIDER_SITE_OTHER): Payer: Medicare Other | Admitting: Ophthalmology

## 2017-11-03 LAB — CUP PACEART REMOTE DEVICE CHECK
Brady Statistic AP VP Percent: 53.5 %
Brady Statistic AP VS Percent: 0.11 %
Brady Statistic AS VP Percent: 44.33 %
Brady Statistic AS VS Percent: 2.06 %
Brady Statistic RV Percent Paced: 97.83 %
Implantable Lead Implant Date: 20180620
Implantable Lead Location: 753859
Implantable Lead Location: 753860
Implantable Lead Model: 5076
Lead Channel Impedance Value: 323 Ohm
Lead Channel Impedance Value: 437 Ohm
Lead Channel Impedance Value: 456 Ohm
Lead Channel Pacing Threshold Amplitude: 0.625 V
Lead Channel Pacing Threshold Pulse Width: 0.4 ms
Lead Channel Sensing Intrinsic Amplitude: 6.25 mV
Lead Channel Sensing Intrinsic Amplitude: 6.25 mV
Lead Channel Sensing Intrinsic Amplitude: 6.5 mV
Lead Channel Setting Pacing Amplitude: 1.5 V
Lead Channel Setting Pacing Amplitude: 2.5 V
Lead Channel Setting Pacing Pulse Width: 0.4 ms
Lead Channel Setting Sensing Sensitivity: 2 mV
MDC IDC LEAD IMPLANT DT: 20180620
MDC IDC MSMT BATTERY REMAINING LONGEVITY: 126 mo
MDC IDC MSMT BATTERY VOLTAGE: 3.06 V
MDC IDC MSMT LEADCHNL RA IMPEDANCE VALUE: 361 Ohm
MDC IDC MSMT LEADCHNL RA PACING THRESHOLD AMPLITUDE: 0.5 V
MDC IDC MSMT LEADCHNL RA SENSING INTR AMPL: 6.5 mV
MDC IDC MSMT LEADCHNL RV PACING THRESHOLD PULSEWIDTH: 0.4 ms
MDC IDC PG IMPLANT DT: 20180620
MDC IDC SESS DTM: 20190321193221
MDC IDC STAT BRADY RA PERCENT PACED: 53.36 %

## 2017-11-09 ENCOUNTER — Other Ambulatory Visit: Payer: Self-pay | Admitting: Family Medicine

## 2017-11-20 ENCOUNTER — Encounter (INDEPENDENT_AMBULATORY_CARE_PROVIDER_SITE_OTHER): Payer: Medicare HMO | Admitting: Ophthalmology

## 2017-11-20 DIAGNOSIS — D3131 Benign neoplasm of right choroid: Secondary | ICD-10-CM | POA: Diagnosis not present

## 2017-11-20 DIAGNOSIS — H33301 Unspecified retinal break, right eye: Secondary | ICD-10-CM | POA: Diagnosis not present

## 2017-11-20 DIAGNOSIS — H35033 Hypertensive retinopathy, bilateral: Secondary | ICD-10-CM

## 2017-11-20 DIAGNOSIS — H348322 Tributary (branch) retinal vein occlusion, left eye, stable: Secondary | ICD-10-CM

## 2017-11-20 DIAGNOSIS — H43813 Vitreous degeneration, bilateral: Secondary | ICD-10-CM | POA: Diagnosis not present

## 2017-11-20 DIAGNOSIS — I10 Essential (primary) hypertension: Secondary | ICD-10-CM | POA: Diagnosis not present

## 2017-11-21 ENCOUNTER — Encounter (INDEPENDENT_AMBULATORY_CARE_PROVIDER_SITE_OTHER): Payer: Medicare HMO | Admitting: Ophthalmology

## 2017-11-28 ENCOUNTER — Other Ambulatory Visit: Payer: Self-pay | Admitting: Family Medicine

## 2017-11-28 DIAGNOSIS — I10 Essential (primary) hypertension: Secondary | ICD-10-CM

## 2017-12-04 ENCOUNTER — Ambulatory Visit (INDEPENDENT_AMBULATORY_CARE_PROVIDER_SITE_OTHER): Payer: Medicare HMO | Admitting: Family Medicine

## 2017-12-04 ENCOUNTER — Encounter: Payer: Self-pay | Admitting: Family Medicine

## 2017-12-04 VITALS — BP 126/80 | HR 60 | Temp 98.0°F | Ht 66.0 in | Wt 154.8 lb

## 2017-12-04 DIAGNOSIS — L299 Pruritus, unspecified: Secondary | ICD-10-CM | POA: Diagnosis not present

## 2017-12-04 DIAGNOSIS — R143 Flatulence: Secondary | ICD-10-CM | POA: Diagnosis not present

## 2017-12-04 DIAGNOSIS — J3489 Other specified disorders of nose and nasal sinuses: Secondary | ICD-10-CM | POA: Diagnosis not present

## 2017-12-04 NOTE — Patient Instructions (Signed)
Continue on the probiotic and Gas X

## 2017-12-04 NOTE — Progress Notes (Signed)
   Subjective:    Patient ID: [name], male    DOB: 08-19-1931, 84 y.o.   MRN: [id number]  HPI He states that since he has had his pacemaker placed, he has had GI issues with excessive gas production.  At one point he did have some constipation now that is not a problem.  He continues on MiraLAX.  He has apparently tried a probiotic but has not seen much success with that.  He is also checked into various foods that can cause excessive gas and try to minimize them.  He also complains of some pruritus in the inguinal area that is very intermittent in nature and goes away relatively quickly.   Review of Systems     Objective:   Physical Exam Alert and in no distress.  A vascular lesion is present on the left side of the nose.  Exam of the inguinal area shows no lesions.       Assessment & Plan:  Flatulence  Pruritus  Nasal lesion Recommend continuing with MiraLAX as well as adding Gas-X to the regimen and a probiotic.  If continued difficulty, I will refer to GI. Recommended a cortisone cream for the itchy area. He is to watch the nasal lesion and if it does not go away within the next several weeks, he is to call for referral to dermatology.

## 2017-12-05 ENCOUNTER — Other Ambulatory Visit: Payer: Self-pay

## 2017-12-05 ENCOUNTER — Encounter: Payer: Self-pay | Admitting: Nurse Practitioner

## 2017-12-05 ENCOUNTER — Telehealth: Payer: Self-pay

## 2017-12-05 DIAGNOSIS — R143 Flatulence: Secondary | ICD-10-CM

## 2017-12-05 NOTE — Telephone Encounter (Signed)
Set up a GI eval

## 2017-12-05 NOTE — Telephone Encounter (Signed)
Patient called and wants you to call him today.  He wants to discuss things from his appointment yesterday.  Thank you

## 2017-12-05 NOTE — Telephone Encounter (Signed)
Done KH 

## 2017-12-09 ENCOUNTER — Other Ambulatory Visit: Payer: Self-pay | Admitting: Family Medicine

## 2017-12-09 DIAGNOSIS — K449 Diaphragmatic hernia without obstruction or gangrene: Secondary | ICD-10-CM

## 2017-12-19 ENCOUNTER — Other Ambulatory Visit: Payer: Self-pay | Admitting: Family Medicine

## 2017-12-25 ENCOUNTER — Ambulatory Visit: Payer: Medicare HMO | Admitting: Nurse Practitioner

## 2017-12-25 ENCOUNTER — Telehealth: Payer: Self-pay | Admitting: Nurse Practitioner

## 2017-12-25 VITALS — Ht 65.5 in | Wt 155.4 lb

## 2017-12-25 DIAGNOSIS — R140 Abdominal distension (gaseous): Secondary | ICD-10-CM | POA: Diagnosis not present

## 2017-12-25 DIAGNOSIS — R143 Flatulence: Secondary | ICD-10-CM

## 2017-12-25 DIAGNOSIS — K5900 Constipation, unspecified: Secondary | ICD-10-CM

## 2017-12-25 MED ORDER — VSL#3 PO CAPS: ORAL_CAPSULE | ORAL | 1 refills | Status: DC

## 2017-12-25 NOTE — Patient Instructions (Signed)
If you are age 82 or older, your body mass index should be between 23-30. Your Body mass index is 25.47 kg/m. If this is out of the aforementioned range listed, please consider follow up with your Primary Care Provider.  If you are age 92 or younger, your body mass index should be between 19-25. Your Body mass index is 25.47 kg/m. If this is out of the aformentioned range listed, please consider follow up with your Primary Care Provider.   You have been given a prescription for VSL #3.  Please take this prescription to [facility] to be filled.  Continue Miralax as needed.  You have been given Gas Producing Foot List.  Call me in three weeks with an update.  Thank you for choosing me and [facility].   Tye Savoy, NP

## 2017-12-25 NOTE — Telephone Encounter (Signed)
Spoke with patient regarding medication instruction.  Patient advised to take medication morning and evening.  Does not have to be taken with food.  Should not be taken with hot liquids.

## 2017-12-25 NOTE — Progress Notes (Signed)
Chief Complaint: gas / bloating  Referring Provider:    Jill Alexanders, MD    ASSESSMENT AND PLAN;   35.  84 year old male with one year history of daily bloating, excessive gas. Started after PPM placement a year ago.  -brochure on gas producing foods given -Trial of VSL #3 for a couple of months -Call with condition update in 3 to 4 weeks.  If no improvement will consider breath testing for SIBO  2.  Constipation.  Fiber no longer effective.  Now taking MiraLAX as needed with good management of his constipation  HPI:     Patient is an 84 year old male with history of prostate cancer, status post prostatectomy in the 1990s.  He has a hx of HTN, complete heart block s/p PPM  He is new to the practice, referred by his PCP. He was followed years ago by Dr. Watt Climes. Last colonoscopy was > 10 years ago, no polyps to his knowledge.  Bowels always normal on daily metamucil since 1990's. Then in June 2018 got a PPM and has had constipation since then. He started HCTZ after pacemaker placement., no other med changes at the time. He has discontinued vitamins / supplements. Stopped Metamucil, started taking Miralax as needed which helps constipation but he still has gas / bloating. No rectal bleeding. No unexpected weight loss. He hasn't changed diet. The only dairy he consumes is skim milk in am and maybe some ice cream in evening. Intestinal gas starts to accumulate after lunch. He started [facility] two months ago but no improvement. Tried acupuncture but it didn't help.   Past Medical History:  Diagnosis Date  . 1st degree AV block   . Arthritis    "hands, back, neck, shoulders, knees" (01/11/2017)  . Eczema   . GERD (gastroesophageal reflux disease)   . HH (hiatus hernia)   . History of blood transfusion    "maybe; w/prostate OR; they would have used my own" (01/11/2017)  . History of colonic polyps    "I don't think I've had any polyps" (01/11/2017)  . Hypercholesterolemia   .  Hypertension   . Presence of permanent cardiac pacemaker   . Prostate cancer ([place]) 1997  . Seasonal allergies   . SVT (supraventricular tachycardia) (HCC)      Past Surgical History:  Procedure Laterality Date  . AV NODE ABLATION  2000s X 2   AVNODE REENTRANT ABLATION  . CATARACT EXTRACTION W/ INTRAOCULAR LENS  IMPLANT, BILATERAL    . COLONOSCOPY  2007   MAGOD  . INSERT / REPLACE / REMOVE PACEMAKER  01/11/2017  . PACEMAKER IMPLANT N/A 01/11/2017   Procedure: Pacemaker Implant;  Surgeon: Evans Lance, MD;  Location: [facility];  Service: Cardiovascular;  Laterality: N/A;  . PROSTATECTOMY    . TESTICLE SURGERY Left 2010   "benign growth"; [facility]  . TONSILLECTOMY     Family History  Problem Relation Age of Onset  . Cancer Mother   . Alcoholism Father    Social History   Tobacco Use  . Smoking status: Former Smoker    Packs/day: 1.50    Years: 13.00    Pack years: 19.50    Types: Cigarettes    Last attempt to quit: 08/25/1962    Years since quitting: 55.3  . Smokeless tobacco: Never Used  Substance Use Topics  . Alcohol use: No  . Drug use: No   Current Outpatient Medications  Medication Sig Dispense Refill  . acetaminophen (TYLENOL)  650 MG CR tablet Take 650 mg by mouth daily as needed for pain.    Marland Kitchen amLODipine (NORVASC) 5 MG tablet TAKE 1 TABLET DAILY 90 tablet 0  . aspirin 81 MG tablet Take 162 mg by mouth daily.     . cetirizine (ZYRTEC) 10 MG tablet Take 10 mg by mouth at bedtime.     Marland Kitchen desonide (DESOWEN) 0.05 % cream APPLY TWO TIMES DAILY 15 g 1  . fluticasone (FLONASE) 50 MCG/ACT nasal spray USE 2 SPRAYS IN EACH       NOSTRIL DAILY 48 g 0  . hydrochlorothiazide (HYDRODIURIL) 12.5 MG tablet TAKE 1 TABLET DAILY 90 tablet 0  . ibuprofen (ADVIL,MOTRIN) 200 MG tablet Take 400 mg by mouth daily as needed (pain).    Marland Kitchen omeprazole (PRILOSEC) 20 MG capsule TAKE 1 CAPSULE BY MOUTH  TWICE DAILY BEFORE MEALS (Patient taking differently: TAKE 1 CAPSULE BY MOUTH   Once a day before breakfast.) 180 capsule 3  . pravastatin (PRAVACHOL) 40 MG tablet Take 1 tablet (40 mg total) by mouth daily. 90 tablet 3  . Omega-3 Fatty Acids (FISH OIL) 1200 MG CAPS Take 1,200 mg by mouth daily.     No current facility-administered medications for this visit.    Allergies  Allergen Reactions  . Codeine Nausea And Vomiting     Review of Systems: All systems reviewed and negative except where noted in HPI.     Physical Exam:    Wt Readings from Last 3 Encounters:  12/25/17 155 lb 6.4 oz (70.5 kg)  12/04/17 154 lb 12.8 oz (70.2 kg)  09/07/17 159 lb (72.1 kg)    Ht 5' 5.5" (1.664 m)   Wt 155 lb 6.4 oz (70.5 kg)   BMI 25.47 kg/m  Constitutional:  Pleasant male in no acute distress. Psychiatric: Normal mood and affect. Behavior is normal. EENT: Pupils normal.  Conjunctivae are normal. No scleral icterus. Neck supple.  Cardiovascular: Normal rate, regular rhythm. No edema Pulmonary/chest: Effort normal and breath sounds normal. No wheezing, rales or rhonchi. Abdominal: Soft, nondistended, nontender. Bowel sounds active throughout. There are no masses palpable. No hepatomegaly. Neurological: Alert and oriented to person place and time. Skin: Skin is warm and dry. No rashes noted.  Tye Savoy, NP  12/25/2017, 10:37 AM  Cc: Denita Lung, MD

## 2017-12-26 ENCOUNTER — Encounter: Payer: Self-pay | Admitting: Internal Medicine

## 2017-12-26 ENCOUNTER — Encounter: Payer: Self-pay | Admitting: Nurse Practitioner

## 2017-12-26 ENCOUNTER — Ambulatory Visit (INDEPENDENT_AMBULATORY_CARE_PROVIDER_SITE_OTHER): Payer: Medicare HMO | Admitting: Internal Medicine

## 2017-12-26 VITALS — BP 132/78 | HR 77 | Ht 65.5 in | Wt 155.0 lb

## 2017-12-26 DIAGNOSIS — Z950 Presence of cardiac pacemaker: Secondary | ICD-10-CM

## 2017-12-26 DIAGNOSIS — I10 Essential (primary) hypertension: Secondary | ICD-10-CM | POA: Diagnosis not present

## 2017-12-26 DIAGNOSIS — I442 Atrioventricular block, complete: Secondary | ICD-10-CM

## 2017-12-26 NOTE — Progress Notes (Signed)
HPI [name] returns today for PPM followup and ongoing evaluation and management of his symptomatic bradycardia. In the interim he has had some GI difficulties with constipation. He has been taking probiotics. He denies chest pain, sob, or palpitations.  Allergies  Allergen Reactions  . Codeine Nausea And Vomiting     Current Outpatient Medications  Medication Sig Dispense Refill  . acetaminophen (TYLENOL) 650 MG CR tablet Take 650 mg by mouth daily as needed for pain.    Marland Kitchen amLODipine (NORVASC) 5 MG tablet TAKE 1 TABLET DAILY 90 tablet 0  . aspirin 81 MG tablet Take 162 mg by mouth daily.     . cetirizine (ZYRTEC) 10 MG tablet Take 10 mg by mouth at bedtime.     Marland Kitchen desonide (DESOWEN) 0.05 % cream APPLY TWO TIMES DAILY 15 g 1  . fluticasone (FLONASE) 50 MCG/ACT nasal spray USE 2 SPRAYS IN EACH       NOSTRIL DAILY 48 g 0  . hydrochlorothiazide (HYDRODIURIL) 12.5 MG tablet TAKE 1 TABLET DAILY 90 tablet 0  . ibuprofen (ADVIL,MOTRIN) 200 MG tablet Take 400 mg by mouth daily as needed (pain).    Marland Kitchen omeprazole (PRILOSEC) 20 MG capsule TAKE 1 CAPSULE BY MOUTH  TWICE DAILY BEFORE MEALS 180 capsule 3  . pravastatin (PRAVACHOL) 40 MG tablet Take 1 tablet (40 mg total) by mouth daily. 90 tablet 3  . Probiotic Product (VSL#3) CAPS Take 112.5 mg 2 twice daily for two weeks, then 2 daily. 178 capsule 1   No current facility-administered medications for this visit.      Past Medical History:  Diagnosis Date  . 1st degree AV block   . Arthritis    "hands, back, neck, shoulders, knees" (01/11/2017)  . Eczema   . GERD (gastroesophageal reflux disease)   . HH (hiatus hernia)   . History of blood transfusion    "maybe; w/prostate OR; they would have used my own" (01/11/2017)  . History of colonic polyps    "I don't think I've had any polyps" (01/11/2017)  . Hypercholesterolemia   . Hypertension   . Presence of permanent cardiac pacemaker   . Prostate cancer ([place]) 1997  . Seasonal  allergies   . SVT (supraventricular tachycardia) (HCC)     ROS:   All systems reviewed and negative except as noted in the HPI.   Past Surgical History:  Procedure Laterality Date  . AV NODE ABLATION  2000s X 2   AVNODE REENTRANT ABLATION  . CATARACT EXTRACTION W/ INTRAOCULAR LENS  IMPLANT, BILATERAL    . COLONOSCOPY  2007   MAGOD  . INSERT / REPLACE / REMOVE PACEMAKER  01/11/2017  . PACEMAKER IMPLANT N/A 01/11/2017   Procedure: Pacemaker Implant;  Surgeon: Evans Lance, MD;  Location: [facility];  Service: Cardiovascular;  Laterality: N/A;  . PROSTATECTOMY    . TESTICLE SURGERY Left 2010   "benign growth"; [facility]  . TONSILLECTOMY       Family History  Problem Relation Age of Onset  . Cancer Mother   . Alcoholism Father      Social History   Socioeconomic History  . Marital status: Married    Spouse name: Not on file  . Number of children: Not on file  . Years of education: Not on file  . Highest education level: Not on file  Occupational History  . Not on file  Social Needs  . Financial resource strain: Not on file  .  Food insecurity:    Worry: Not on file    Inability: Not on file  . Transportation needs:    Medical: Not on file    Non-medical: Not on file  Tobacco Use  . Smoking status: Former Smoker    Packs/day: 1.50    Years: 13.00    Pack years: 19.50    Types: Cigarettes    Last attempt to quit: 08/25/1962    Years since quitting: 55.3  . Smokeless tobacco: Never Used  Substance and Sexual Activity  . Alcohol use: No  . Drug use: No  . Sexual activity: Never  Lifestyle  . Physical activity:    Days per week: Not on file    Minutes per session: Not on file  . Stress: Not on file  Relationships  . Social connections:    Talks on phone: Not on file    Gets together: Not on file    Attends religious service: Not on file    Active member of club or organization: Not on file    Attends meetings of clubs or organizations: Not on  file    Relationship status: Not on file  . Intimate partner violence:    Fear of current or ex partner: Not on file    Emotionally abused: Not on file    Physically abused: Not on file    Forced sexual activity: Not on file  Other Topics Concern  . Not on file  Social History Narrative   Originally from [place]     BP 132/78   Pulse 77   Ht 5' 5.5" (1.664 m)   Wt 155 lb (70.3 kg)   BMI 25.40 kg/m   Physical Exam:  Well appearing elderly man, NAD HEENT: Unremarkable Neck:  6 cm JVD, no thyromegally Lymphatics:  No adenopathy Back:  No CVA tenderness Lungs:  Clear with no wheezes HEART:  Regular rate rhythm, no murmurs, no rubs, no clicks Abd:  soft, positive bowel sounds, no organomegally, no rebound, no guarding Ext:  2 plus pulses, no edema, no cyanosis, no clubbing Skin:  No rashes no nodules Neuro:  CN II through XII intact, motor grossly intact  EKG - NSR with ventricular pacing  DEVICE  Normal device function.  See PaceArt for details.   Assess/Plan: 1. CHB - he has an escape in the 30's today. We will follow. 2. PPM - his Medtronic DDD PM is working normally. Will follow. 3. HTN - his blood pressure is good today. No change in meds. He is encouraged to maintain a low sodium diet.  Mikle Bosworth.D.

## 2017-12-26 NOTE — Progress Notes (Signed)
Assessment and plans reviewed  

## 2017-12-26 NOTE — Patient Instructions (Signed)
Medication Instructions:  Your physician recommends that you continue on your current medications as directed. Please refer to the Current Medication list given to you today.  Labwork: None ordered.  Testing/Procedures: None ordered.  Follow-Up: Your physician wants you to follow-up in: one year with Dr. Lovena Le.   You will receive a reminder letter in the mail two months in advance. If you don't receive a letter, please call our office to schedule the follow-up appointment.  Remote monitoring is used to monitor your Pacemaker from home. This monitoring reduces the number of office visits required to check your device to one time per year. It allows [place] to keep an eye on the functioning of your device to ensure it is working properly. You are scheduled for a device check from home on 01/11/2018. You may send your transmission at any time that day. If you have a wireless device, the transmission will be sent automatically. After your physician reviews your transmission, you will receive a postcard with your next transmission date.  Any Other Special Instructions Will Be Listed Below (If Applicable).  If you need a refill on your cardiac medications before your next appointment, please call your pharmacy.

## 2017-12-27 LAB — CUP PACEART INCLINIC DEVICE CHECK
Battery Voltage: 3.04 V
Brady Statistic AP VP Percent: 54.68 %
Brady Statistic AS VS Percent: 2.71 %
Brady Statistic RA Percent Paced: 54.65 %
Brady Statistic RV Percent Paced: 97.14 %
Implantable Lead Implant Date: 20180620
Implantable Lead Location: 753860
Implantable Pulse Generator Implant Date: 20180620
Lead Channel Impedance Value: 323 Ohm
Lead Channel Impedance Value: 399 Ohm
Lead Channel Pacing Threshold Amplitude: 0.625 V
Lead Channel Pacing Threshold Pulse Width: 0.4 ms
Lead Channel Pacing Threshold Pulse Width: 0.4 ms
Lead Channel Sensing Intrinsic Amplitude: 3.375 mV
Lead Channel Setting Pacing Amplitude: 1.5 V
Lead Channel Setting Sensing Sensitivity: 2 mV
MDC IDC LEAD IMPLANT DT: 20180620
MDC IDC LEAD LOCATION: 753859
MDC IDC MSMT BATTERY REMAINING LONGEVITY: 121 mo
MDC IDC MSMT LEADCHNL RA IMPEDANCE VALUE: 342 Ohm
MDC IDC MSMT LEADCHNL RA IMPEDANCE VALUE: 456 Ohm
MDC IDC MSMT LEADCHNL RA SENSING INTR AMPL: 3.125 mV
MDC IDC MSMT LEADCHNL RA SENSING INTR AMPL: 5.5 mV
MDC IDC MSMT LEADCHNL RV PACING THRESHOLD AMPLITUDE: 0.625 V
MDC IDC MSMT LEADCHNL RV SENSING INTR AMPL: 3.75 mV
MDC IDC SESS DTM: 20190604142154
MDC IDC SET LEADCHNL RV PACING AMPLITUDE: 2.5 V
MDC IDC SET LEADCHNL RV PACING PULSEWIDTH: 0.4 ms
MDC IDC STAT BRADY AP VS PERCENT: 0.15 %
MDC IDC STAT BRADY AS VP PERCENT: 42.46 %

## 2017-12-28 ENCOUNTER — Encounter (INDEPENDENT_AMBULATORY_CARE_PROVIDER_SITE_OTHER): Payer: Self-pay | Admitting: Orthopaedic Surgery

## 2017-12-28 ENCOUNTER — Telehealth (INDEPENDENT_AMBULATORY_CARE_PROVIDER_SITE_OTHER): Payer: Self-pay

## 2017-12-28 ENCOUNTER — Ambulatory Visit (INDEPENDENT_AMBULATORY_CARE_PROVIDER_SITE_OTHER): Payer: Medicare HMO | Admitting: Orthopaedic Surgery

## 2017-12-28 DIAGNOSIS — G8929 Other chronic pain: Secondary | ICD-10-CM

## 2017-12-28 DIAGNOSIS — M25561 Pain in right knee: Secondary | ICD-10-CM | POA: Diagnosis not present

## 2017-12-28 MED ORDER — LIDOCAINE HCL 1 % IJ SOLN
2.0000 mL | INTRAMUSCULAR | Status: AC | PRN
  Administered 2017-12-28: 2 mL

## 2017-12-28 MED ORDER — METHYLPREDNISOLONE ACETATE 40 MG/ML IJ SUSP
40.0000 mg | INTRAMUSCULAR | Status: AC | PRN
  Administered 2017-12-28: 40 mg via INTRA_ARTICULAR

## 2017-12-28 MED ORDER — BUPIVACAINE HCL 0.25 % IJ SOLN
2.0000 mL | INTRAMUSCULAR | Status: AC | PRN
  Administered 2017-12-28: 2 mL via INTRA_ARTICULAR

## 2017-12-28 NOTE — Progress Notes (Signed)
Office Visit Note   Patient: [name]           Date of Birth: [birth year]           MRN: [id number] Visit Date: 12/28/2017              Requested by: Denita Lung, MD [place], [place] [address] PCP: Denita Lung, MD   Assessment & Plan: Visit Diagnoses:  1. Chronic pain of right knee     Plan: Impression is primary localized osteoarthritis right knee.  Today, we will inject this with cortisone.  We will also send for approval for Visco supplementation injection for the right knee.  He will follow-up with [place] once this has been approved.  Follow-Up Instructions: Return if symptoms worsen or fail to improve.   Orders:  Orders Placed This Encounter  Procedures  . Large Joint Inj: R knee   No orders of the defined types were placed in this encounter.     Procedures: Large Joint Inj: R knee on 12/28/2017 10:09 AM Indications: pain Details: 22 G needle, anterolateral approach Medications: 2 mL lidocaine 1 %; 2 mL bupivacaine 0.25 %; 40 mg methylPREDNISolone acetate 40 MG/ML      Clinical Data: No additional findings.   Subjective: Chief Complaint  Patient presents with  . Right Knee - Pain    S/p cortisone injection 07/11/18    HPI patient is a pleasant 84 year old gentleman with a history of osteoarthritis to the right knee.  He has been dealing with this with intermittent cortisone as well as Visco supplementation injections.  These have been of moderate relief.  His most recent cortisone injection was in December 2018 and has significantly helped until recently.  His pain is mostly with activity.  No night pain.  Review of Systems as detailed in HPI.  All others are negative.   Objective: Vital Signs: There were no vitals taken for this visit.  Physical Exam well-developed well-nourished gentleman no acute distress.  Alert and oriented x3.  Ortho Exam stable knee exam  Specialty Comments:  No specialty comments  available.  Imaging: No results found.   PMFS History: Patient Active Problem List   Diagnosis Date Noted  . Pacemaker 04/21/2017  . Complete heart block ([place]) 01/11/2017  . Acute pain of left shoulder 08/02/2016  . History of prostate cancer 12/27/2010  . Arthritis 12/27/2010  . Hx of cataract surgery 12/27/2010  . HH (hiatus hernia)   . History of colonic polyps   . Hyperlipidemia LDL goal <130 04/21/2008  . Allergic rhinitis 04/21/2008  . Essential hypertension 04/18/2008   Past Medical History:  Diagnosis Date  . 1st degree AV block   . Arthritis    "hands, back, neck, shoulders, knees" (01/11/2017)  . Eczema   . GERD (gastroesophageal reflux disease)   . HH (hiatus hernia)   . History of blood transfusion    "maybe; w/prostate OR; they would have used my own" (01/11/2017)  . History of colonic polyps    "I don't think I've had any polyps" (01/11/2017)  . Hypercholesterolemia   . Hypertension   . Presence of permanent cardiac pacemaker   . Prostate cancer ([place]) 1997  . Seasonal allergies   . SVT (supraventricular tachycardia) (HCC)     Family History  Problem Relation Age of Onset  . Cancer Mother   . Alcoholism Father     Past Surgical History:  Procedure Laterality Date  . AV NODE ABLATION  2000s X 2   AVNODE REENTRANT ABLATION  . CATARACT EXTRACTION W/ INTRAOCULAR LENS  IMPLANT, BILATERAL    . COLONOSCOPY  2007   MAGOD  . INSERT / REPLACE / REMOVE PACEMAKER  01/11/2017  . PACEMAKER IMPLANT N/A 01/11/2017   Procedure: Pacemaker Implant;  Surgeon: Evans Lance, MD;  Location: [facility];  Service: Cardiovascular;  Laterality: N/A;  . PROSTATECTOMY    . TESTICLE SURGERY Left 2010   "benign growth"; KIMBROUGH  . TONSILLECTOMY     Social History   Occupational History  . Not on file  Tobacco Use  . Smoking status: Former Smoker    Packs/day: 1.50    Years: 13.00    Pack years: 19.50    Types: Cigarettes    Last attempt to quit: 08/25/1962     Years since quitting: 55.3  . Smokeless tobacco: Never Used  Substance and Sexual Activity  . Alcohol use: No  . Drug use: No  . Sexual activity: Never

## 2017-12-28 NOTE — Telephone Encounter (Signed)
Submitted application online for Monovisc injection, right knee. 

## 2018-01-01 ENCOUNTER — Telehealth: Payer: Self-pay | Admitting: Nurse Practitioner

## 2018-01-01 DIAGNOSIS — R143 Flatulence: Secondary | ICD-10-CM

## 2018-01-01 DIAGNOSIS — R140 Abdominal distension (gaseous): Secondary | ICD-10-CM

## 2018-01-01 DIAGNOSIS — K5900 Constipation, unspecified: Secondary | ICD-10-CM

## 2018-01-01 NOTE — Telephone Encounter (Signed)
Patient wanting to know if [name] had a dietitian she could refer the patient to or just one that she suggested.

## 2018-01-01 NOTE — Telephone Encounter (Signed)
I can refer him to [facility]. What would be the diagnosis?

## 2018-01-02 DIAGNOSIS — R69 Illness, unspecified: Secondary | ICD-10-CM | POA: Diagnosis not present

## 2018-01-02 NOTE — Telephone Encounter (Signed)
Patient is wanting to follow up about referral best call back # [phone].

## 2018-01-02 NOTE — Telephone Encounter (Signed)
He says IBS constipation, bloating. Okay to refer?

## 2018-01-03 NOTE — Telephone Encounter (Signed)
[name], first how is his constipation because that can cause bloating?  Second, did he try the VS L #3?  I do not know that a dietitian is going to be extremely helpful if these things have not been tried.  Also I would start with elimination of some basic foods known to cause bloating.  Following that we could try a course of antibiotics to treat empirically for SIBO. Thanks

## 2018-01-03 NOTE — Telephone Encounter (Signed)
He says it is fine if it is only one visit. He feels it would be beneficial overall for his health.

## 2018-01-04 NOTE — Telephone Encounter (Signed)
He is having daily bowel movements and does not feel constipated.Yes he is on VSL. Yes he has eliminated the foods he has found to be symptom causing. He again asks for the referral. He "just want a conversation about good foods" he can eat that will not trigger constipation or cause the bloating. He states he does not eat what he knows to cause symptoms, but his list of foods he can eat is becoming increasingly shorter. He is concerned he may not be eating balanced diet also.

## 2018-01-05 ENCOUNTER — Telehealth (INDEPENDENT_AMBULATORY_CARE_PROVIDER_SITE_OTHER): Payer: Self-pay

## 2018-01-05 NOTE — Telephone Encounter (Signed)
PA required for Monovisc injection, right knee.

## 2018-01-05 NOTE — Telephone Encounter (Signed)
Pt is calling back about being referred to a Nutritionist

## 2018-01-05 NOTE — Telephone Encounter (Signed)
Nevin Bloodgood ok to make the referral to nutrition?  I saw your note, but he is still requesting.

## 2018-01-05 NOTE — Telephone Encounter (Signed)
Sheri,  It is fine to send him to a Dietician for constipation / bloating though sx not necessarily related to eating. Thanks

## 2018-01-08 NOTE — Telephone Encounter (Signed)
Patient notified and referral placed

## 2018-01-09 ENCOUNTER — Telehealth: Payer: Self-pay | Admitting: Family Medicine

## 2018-01-09 NOTE — Telephone Encounter (Signed)
P.A. OMEPRAZOLE 20MG 

## 2018-01-11 ENCOUNTER — Telehealth: Payer: Self-pay | Admitting: Cardiology

## 2018-01-11 ENCOUNTER — Telehealth (INDEPENDENT_AMBULATORY_CARE_PROVIDER_SITE_OTHER): Payer: Self-pay

## 2018-01-11 ENCOUNTER — Encounter: Payer: Medicare HMO | Admitting: *Deleted

## 2018-01-11 NOTE — Telephone Encounter (Signed)
LMOVM reminding pt to send remote transmission.   

## 2018-01-11 NOTE — Telephone Encounter (Signed)
Called and initiated PA for Monovisc, right knee.  Per Lindajo Royal., PA form will be faxed to our office within 48hrs.

## 2018-01-12 ENCOUNTER — Encounter: Payer: Self-pay | Admitting: Cardiology

## 2018-01-15 ENCOUNTER — Telehealth: Payer: Self-pay | Admitting: Nurse Practitioner

## 2018-01-15 NOTE — Telephone Encounter (Signed)
Patient says callback anytime after 10:30am

## 2018-01-16 ENCOUNTER — Telehealth: Payer: Self-pay | Admitting: Internal Medicine

## 2018-01-16 NOTE — Telephone Encounter (Signed)
15 minute conversation with the patient. He manages the constipation with Miralax. He still has gas. "The bloating" he is not certain this word is appropriate, but he does have gas. "But we all have gas." Can't eat peanut butter and jelly sandwiches. Cannot eat an apple. The gas he developes is a problem.  "I don't know if this probiotic is helping. I stopped the Miralax when I started the probiotic. Then 8 days later, [facility], I am constipated."He did resume Miralax and takes less than the recommended dose every day. Plans to continue the VSL as he was instructed.  He will finish the course next week. He doesn't understand why no one can tell him what is wrong and why he has constipation. He was never constipated and gassy before the prostate surgery.  He wants to pursue the breath test you told him about. Please advise. He indicates he wants to do further testing.

## 2018-01-16 NOTE — Telephone Encounter (Signed)
Please call pt. He just called stating that he had not heard from [place] yet.

## 2018-01-16 NOTE — Telephone Encounter (Signed)
Pt calling about where to put his monitor at in his home. Please call pt.

## 2018-01-16 NOTE — Telephone Encounter (Signed)
Spoke with patient and explained that he could place his home monitor next to his recliner where he spends most of his day time hours. Patient verbalized understanding.

## 2018-01-17 ENCOUNTER — Encounter (INDEPENDENT_AMBULATORY_CARE_PROVIDER_SITE_OTHER): Payer: Self-pay | Admitting: Radiology

## 2018-01-17 NOTE — Telephone Encounter (Signed)
P.A. Approved for #60 for 30, faxed pharmacy and left message for pt

## 2018-01-17 NOTE — Progress Notes (Signed)
Patient will hold off on Monovisc injection for now, he knows he is approved until 9/20 so will call back if needed. Thanks!

## 2018-01-17 NOTE — Progress Notes (Unsigned)
Will you pls call patient and sched appt with Dr Erlinda Hong for Monovisc right knee, and please advise him he will owe $25 copay and 20 % OOP for the Monovisc, approx $300.  This is only an estimate, we won't know how much exactly he owes until we file it. Thanks!  Authorization for Monovisc right knee from [facility] # [id number], valid 01/11/18 thru 04/13/18.  Buy and bill allowed.

## 2018-01-18 ENCOUNTER — Encounter: Payer: Self-pay | Admitting: Cardiology

## 2018-01-18 ENCOUNTER — Ambulatory Visit (INDEPENDENT_AMBULATORY_CARE_PROVIDER_SITE_OTHER): Payer: Medicare HMO | Admitting: *Deleted

## 2018-01-18 DIAGNOSIS — I442 Atrioventricular block, complete: Secondary | ICD-10-CM | POA: Diagnosis not present

## 2018-01-18 NOTE — Progress Notes (Signed)
Remote pacemaker transmission.   

## 2018-01-19 LAB — CUP PACEART REMOTE DEVICE CHECK
Battery Remaining Longevity: 120 mo
Battery Voltage: 3.03 V
Brady Statistic AS VS Percent: 2.92 %
Implantable Lead Implant Date: 20180620
Implantable Lead Model: 5076
Implantable Pulse Generator Implant Date: 20180620
Lead Channel Impedance Value: 361 Ohm
Lead Channel Impedance Value: 399 Ohm
Lead Channel Impedance Value: 437 Ohm
Lead Channel Pacing Threshold Amplitude: 0.5 V
Lead Channel Pacing Threshold Amplitude: 0.625 V
Lead Channel Pacing Threshold Pulse Width: 0.4 ms
Lead Channel Sensing Intrinsic Amplitude: 5.75 mV
Lead Channel Sensing Intrinsic Amplitude: 5.75 mV
Lead Channel Setting Sensing Sensitivity: 2 mV
MDC IDC LEAD IMPLANT DT: 20180620
MDC IDC LEAD LOCATION: 753859
MDC IDC LEAD LOCATION: 753860
MDC IDC MSMT LEADCHNL RA PACING THRESHOLD PULSEWIDTH: 0.4 ms
MDC IDC MSMT LEADCHNL RV IMPEDANCE VALUE: 323 Ohm
MDC IDC MSMT LEADCHNL RV SENSING INTR AMPL: 4.25 mV
MDC IDC MSMT LEADCHNL RV SENSING INTR AMPL: 4.25 mV
MDC IDC SESS DTM: 20190627152437
MDC IDC SET LEADCHNL RA PACING AMPLITUDE: 1.5 V
MDC IDC SET LEADCHNL RV PACING AMPLITUDE: 2.5 V
MDC IDC SET LEADCHNL RV PACING PULSEWIDTH: 0.4 ms
MDC IDC STAT BRADY AP VP PERCENT: 62.94 %
MDC IDC STAT BRADY AP VS PERCENT: 0.05 %
MDC IDC STAT BRADY AS VP PERCENT: 34.09 %
MDC IDC STAT BRADY RA PERCENT PACED: 62.88 %
MDC IDC STAT BRADY RV PERCENT PACED: 97.03 %

## 2018-01-22 ENCOUNTER — Telehealth (INDEPENDENT_AMBULATORY_CARE_PROVIDER_SITE_OTHER): Payer: Self-pay

## 2018-01-22 NOTE — Telephone Encounter (Signed)
Patient will hold off on Monovisc injection for now, he knows he is approved until 9/20 so will call back if needed. Thanks!

## 2018-01-22 NOTE — Telephone Encounter (Signed)
Pt is calling back about the previous conversation and wants to know what the next step is going to be

## 2018-01-23 NOTE — Telephone Encounter (Signed)
Spoke with the patient. He is not allowing himself to become constipated. He states he is very careful about that.  Spoke with Tye Savoy NP about the patient. She will call him directly to discuss the plan. Patient is aware of the plan. He understands she will contact him asap which will be today or tomorrow.

## 2018-01-24 ENCOUNTER — Other Ambulatory Visit: Payer: Self-pay

## 2018-01-24 MED ORDER — RIFAXIMIN 550 MG PO TABS: 550.0000 mg | ORAL_TABLET | Freq: Three times a day (TID) | ORAL | 0 refills | Status: DC

## 2018-01-24 NOTE — Telephone Encounter (Signed)
I called patient today. He is still having problems with increase intestinal gas. No bloating. He is taking 1/2 capful of miralax a day and has a normal BM every day after breakfast. He stopped miralax for a week and got constipated. He is back on miralax and bowels moving fine again. Weight stable. No blood in stable. Tried VSL # 3 probiotics but they didn't help. Certain foods such as peanut jelly sandwich, apple, and wheat can cause increased gas for him but this didn't use to be the case.   We discussed options.  We could treat empirically with rifaximin versus obtain hydrogen breath test. He is interested in [place].    Beth please help patient get Xifaxan 550 mg TID x  14 days.  Ask him to call us after treatment. Thanks

## 2018-01-26 ENCOUNTER — Other Ambulatory Visit: Payer: Self-pay | Admitting: Emergency Medicine

## 2018-01-26 MED ORDER — RIFAXIMIN 550 MG PO TABS: 550.0000 mg | ORAL_TABLET | Freq: Three times a day (TID) | ORAL | 0 refills | Status: AC

## 2018-01-26 NOTE — Telephone Encounter (Signed)
Rx sent to the pharmacy.

## 2018-01-30 DIAGNOSIS — H40013 Open angle with borderline findings, low risk, bilateral: Secondary | ICD-10-CM | POA: Diagnosis not present

## 2018-01-30 DIAGNOSIS — H02833 Dermatochalasis of right eye, unspecified eyelid: Secondary | ICD-10-CM | POA: Diagnosis not present

## 2018-01-30 DIAGNOSIS — H11002 Unspecified pterygium of left eye: Secondary | ICD-10-CM | POA: Diagnosis not present

## 2018-01-30 DIAGNOSIS — H02403 Unspecified ptosis of bilateral eyelids: Secondary | ICD-10-CM | POA: Diagnosis not present

## 2018-02-01 ENCOUNTER — Ambulatory Visit (INDEPENDENT_AMBULATORY_CARE_PROVIDER_SITE_OTHER): Payer: Medicare HMO

## 2018-02-01 ENCOUNTER — Ambulatory Visit (INDEPENDENT_AMBULATORY_CARE_PROVIDER_SITE_OTHER): Payer: Medicare HMO | Admitting: Physical Medicine and Rehabilitation

## 2018-02-01 ENCOUNTER — Encounter (INDEPENDENT_AMBULATORY_CARE_PROVIDER_SITE_OTHER): Payer: Self-pay | Admitting: Physical Medicine and Rehabilitation

## 2018-02-01 ENCOUNTER — Telehealth: Payer: Self-pay | Admitting: Nurse Practitioner

## 2018-02-01 VITALS — BP 145/85 | HR 72

## 2018-02-01 DIAGNOSIS — R531 Weakness: Secondary | ICD-10-CM | POA: Diagnosis not present

## 2018-02-01 DIAGNOSIS — M4125 Other idiopathic scoliosis, thoracolumbar region: Secondary | ICD-10-CM | POA: Diagnosis not present

## 2018-02-01 DIAGNOSIS — M545 Low back pain: Secondary | ICD-10-CM

## 2018-02-01 DIAGNOSIS — G8929 Other chronic pain: Secondary | ICD-10-CM | POA: Diagnosis not present

## 2018-02-01 DIAGNOSIS — M5416 Radiculopathy, lumbar region: Secondary | ICD-10-CM | POA: Diagnosis not present

## 2018-02-01 DIAGNOSIS — M5136 Other intervertebral disc degeneration, lumbar region: Secondary | ICD-10-CM | POA: Diagnosis not present

## 2018-02-01 NOTE — Progress Notes (Signed)
   Numeric Pain Rating Scale and Functional Assessment Average Pain 2 Pain Right Now 0 My pain is intermittent and dull Pain is worse with: some activites Pain improves with: rest   In the last MONTH (on 0-10 scale) has pain interfered with the following?  1. General activity like being  able to carry out your everyday physical activities such as walking, climbing stairs, carrying groceries, or moving a chair?  Rating(8)  2. Relation with others like being able to carry out your usual social activities and roles such as  activities at home, at work and in your community. Rating(8)  3. Enjoyment of life such that you have  been bothered by emotional problems such as feeling anxious, depressed or irritable?  Rating(0)

## 2018-02-01 NOTE — Progress Notes (Signed)
[name] - 84 y.o. male MRN [id number]  Date of birth: 10-03-1931  Office Visit Note: Visit Date: 02/01/2018 PCP: Denita Lung, MD Referred by: Denita Lung, MD  Subjective: Chief Complaint  Patient presents with  . Lower Back - Pain, Weakness   HPI: [name] is an 84 year old  gentleman who is followed in our office by Dr. Eduard Roux mainly for his orthopedic complaints of knee and hip pain.  Last time I saw him was in 2017 and we completed a hip injections request of Dr. Eduard Roux there really did not seem to help him that much.  In the past I saw him in 2016 for what he referred to is just a weakness of his back.  He reports no real pain or radicular pain or back pain per se but he just relates this is a weakness feeling.  This is the exact same complaint he had in 2016 that he has today.  He denies any numbness in his legs.  He does exercise regularly and does play some golf.  He has really follow along with exercise as for his back for some time.  He has had no new trauma or fevers chills or night sweats or weight loss.  No red flag complaints.  Imaging was completed a long time ago and there is really been no updated imaging.  We will get a x-ray of his lumbar spine today.  The patient does have a pacemaker.  In 2016 we treated his condition with physical therapy as well as prednisone will.  He is also followed by Dr. Jill Alexanders who is a family practice sports medicine doctor.   Review of Systems  Constitutional: Negative for chills, fever, malaise/fatigue and weight loss.  HENT: Negative for hearing loss and sinus pain.   Eyes: Negative for blurred vision, double vision and photophobia.  Respiratory: Negative for cough and shortness of breath.   Cardiovascular: Negative for chest pain, palpitations and leg swelling.  Gastrointestinal: Negative for abdominal pain, nausea and vomiting.  Genitourinary: Negative for flank pain.  Musculoskeletal: Positive for back pain.  Negative for myalgias.       He refers to this is more of back weakness  Skin: Negative for itching and rash.  Neurological: Negative for tremors, focal weakness and weakness.  Endo/Heme/Allergies: Negative.   Psychiatric/Behavioral: Negative for depression.  All other systems reviewed and are negative.  Otherwise per HPI.  Assessment & Plan: Visit Diagnoses:  1. Weakness   2. Chronic bilateral low back pain without sciatica   3. Lumbar radiculopathy     Plan: Findings:  Exam is consistent with some myofascial pain with palpation deep to the quadratus lumborum bilaterally in the paraspinals.  He has very stiff lower back with scoliosis.  He has fair hips.  He has good distal strength.  This seems to be a mechanical nature to his back and I do a CT scan of his lumbar spine.  Initially ordered lumbar MRI more likely to have a pacemaker.  I do not have a myelogram performed at this point.  I will see him back after the CT scan and may end up completing a generalized epidural injection to assist with the degenerative changes of his spine was causing his feeling of weakness.  Otherwise we talked about core strengthening and continued strengthening his back.  We talked about the degenerative nature of his spine and it really is pretty steady and stable and he can really do  what he tolerates.    Meds & Orders: No orders of the defined types were placed in this encounter.   Orders Placed This Encounter  Procedures  . XR Lumbar Spine 2-3 Views  . MR LUMBAR SPINE WO CONTRAST    Follow-up: No follow-ups on file.   Procedures: No procedures performed  No notes on file   Clinical History: No specialty comments available.   He reports that he quit smoking about 55 years ago. His smoking use included cigarettes. He has a 19.50 pack-year smoking history. He has never used smokeless tobacco. No results for input(s): HGBA1C, LABURIC in the last 8760 hours.  Objective:  VS:  HT:    WT:   BMI:      BP:(!) 145/85  HR:72bpm  TEMP: ( )  RESP:  Physical Exam  Constitutional: He is oriented to person, place, and time. He appears well-developed and well-nourished. No distress.  HENT:  Head: Normocephalic and atraumatic.  Nose: Nose normal.  Mouth/Throat: Oropharynx is clear and moist.  Eyes: Pupils are equal, round, and reactive to light. Conjunctivae are normal.  Neck: Normal range of motion. Neck supple. No tracheal deviation present.  Cardiovascular: Regular rhythm and intact distal pulses.  Pulmonary/Chest: Effort normal and breath sounds normal.  Abdominal: Soft. He exhibits no distension. There is no rebound and no guarding.  Musculoskeletal: He exhibits no deformity.  Patient ambulates without aid.  He is somewhat slow to rise from a seated position to full extension.  He does stand with somewhat forward flexion increased kyphosis and some scoliosis.  He has pain with extension at the end ranges.  No pain over the greater trochanters and no pain with hip rotation a little bit stiff on the left compared to right.  He has good distal strength without clonus.  Neurological: He is alert and oriented to person, place, and time. He exhibits normal muscle tone. Coordination normal.  Skin: Skin is warm. No rash noted.  Psychiatric: He has a normal mood and affect. His behavior is normal.  Nursing note and vitals reviewed.   Ortho Exam Imaging: No results found.  Past Medical/Family/Surgical/Social History: Medications & Allergies reviewed per EMR, new medications updated. Patient Active Problem List   Diagnosis Date Noted  . Pacemaker 04/21/2017  . Complete heart block ([facility]) 01/11/2017  . Acute pain of left shoulder 08/02/2016  . History of prostate cancer 12/27/2010  . Arthritis 12/27/2010  . Hx of cataract surgery 12/27/2010  . HH (hiatus hernia)   . History of colonic polyps   . Hyperlipidemia LDL goal <130 04/21/2008  . Allergic rhinitis 04/21/2008  . Essential  hypertension 04/18/2008   Past Medical History:  Diagnosis Date  . 1st degree AV block   . Arthritis    "hands, back, neck, shoulders, knees" (01/11/2017)  . Eczema   . GERD (gastroesophageal reflux disease)   . HH (hiatus hernia)   . History of blood transfusion    "maybe; w/prostate OR; they would have used my own" (01/11/2017)  . History of colonic polyps    "I don't think I've had any polyps" (01/11/2017)  . Hypercholesterolemia   . Hypertension   . Presence of permanent cardiac pacemaker   . Prostate cancer ([facility]) 1997  . Seasonal allergies   . SVT (supraventricular tachycardia) ([facility])    Family History  Problem Relation Age of Onset  . Cancer Mother   . Alcoholism Father    Past Surgical History:  Procedure Laterality Date  . AV  NODE ABLATION  2000s X 2   AVNODE REENTRANT ABLATION  . CATARACT EXTRACTION W/ INTRAOCULAR LENS  IMPLANT, BILATERAL    . COLONOSCOPY  2007   [facility]  . INSERT / REPLACE / REMOVE PACEMAKER  01/11/2017  . PACEMAKER IMPLANT N/A 01/11/2017   Procedure: Pacemaker Implant;  Surgeon: Evans Lance, MD;  Location: [facility];  Service: Cardiovascular;  Laterality: N/A;  . PROSTATECTOMY    . TESTICLE SURGERY Left 2010   "benign growth"; [facility]  . TONSILLECTOMY     Social History   Occupational History  . Not on file  Tobacco Use  . Smoking status: Former Smoker    Packs/day: 1.50    Years: 13.00    Pack years: 19.50    Types: Cigarettes    Last attempt to quit: 08/25/1962    Years since quitting: 55.4  . Smokeless tobacco: Never Used  Substance and Sexual Activity  . Alcohol use: No  . Drug use: No  . Sexual activity: Never

## 2018-02-01 NOTE — Telephone Encounter (Signed)
Spoke with the patient. The Xifaxan prescription is held up by his insurance. The breath test cannot be done if he has been on antibiotics. The patient wants to go forward with the testing while waiting for the decision by his insurance.

## 2018-02-05 ENCOUNTER — Encounter: Payer: Medicare HMO | Attending: Family Medicine | Admitting: Dietician

## 2018-02-05 ENCOUNTER — Telehealth: Payer: Self-pay | Admitting: Nurse Practitioner

## 2018-02-05 ENCOUNTER — Encounter: Payer: Self-pay | Admitting: Dietician

## 2018-02-05 ENCOUNTER — Encounter (INDEPENDENT_AMBULATORY_CARE_PROVIDER_SITE_OTHER): Payer: Self-pay | Admitting: Physical Medicine and Rehabilitation

## 2018-02-05 DIAGNOSIS — R143 Flatulence: Secondary | ICD-10-CM | POA: Diagnosis not present

## 2018-02-05 DIAGNOSIS — Z713 Dietary counseling and surveillance: Secondary | ICD-10-CM | POA: Insufficient documentation

## 2018-02-05 DIAGNOSIS — K5900 Constipation, unspecified: Secondary | ICD-10-CM | POA: Diagnosis not present

## 2018-02-05 DIAGNOSIS — R140 Abdominal distension (gaseous): Secondary | ICD-10-CM | POA: Diagnosis not present

## 2018-02-05 NOTE — Telephone Encounter (Signed)
Spoke with the patient. He has met with an RD. He is going to start following today and see how this helps. He has read the instructions for the Breath Test and decided to wait on this. He has not heard from [facility] about [place]. He should hear something this week.

## 2018-02-05 NOTE — Telephone Encounter (Signed)
Pt would like to speak with you regarding antibiotic that he was put on.

## 2018-02-05 NOTE — Patient Instructions (Addendum)
Adequate fluid intake  Consider trying Activia Yogurt 2 times per day for 2 weeks. Consider other sources of Probiotics from food such as Sheila Oats. (Lifeway brand) Consider adding a small amount (1 tsp) of Ground Flax Seeds to your oatmeal and increase this slowly to 2-3 tsp daily.  Add berries or cherries, greens as tolerated. Consider warm prune juice rather than grape juice in the am. Consider limiting cheese  Be sure you are drinking enough fluid daily. Make one change at a time to see what is helpful.  1.  Activia yogurt 2 daily for 2 weeks  2.  Try blueberries or other fruit added with breakfast OR warm prune juice  3.  Add ground flax seeds to your oatmeal  4.  Increase your vegetable and fruit intake slowly  5.  More whole grains.

## 2018-02-05 NOTE — Progress Notes (Signed)
Medical Nutrition Therapy:  Appt start time: 0910 end time:  1010.   Assessment:  Primary concerns today: Patient is here today alone.  He was referred for increased constipation, gas, and bloating.  He states that the increased gas is new for the past year since he had a pacemaker put in.  He was on Metamucil but increased gas after prostate surgery and then Citrucel and had extreme constipation.  He is now on Mira lax. Constipation from 1997 after prostate surgery.  He has tried various probiotics with some effect initially and then stopped working.  Symptoms begin after lunch. He is very rigid and structured with his eating habits and eats the same thing daily for breakfast and lunch.  He has been on line and wondered if lactose was a problem so switched to lactose free ice cream but continued to use regular milk.  He also found information about a FODMAP diet.   Weight 153 lbs today which patient reports is stable. Other history noted to include HTN, chronic lower back problems and weakness, GERD, hypercholesterolemia.  Patient lives with his wife and a pet bird.  His wife cooks dinner. 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  Preferred Learning Style:   lNo preference indicated   Learning Readiness:   Ready  MEDICATIONS: see list to include Miralax as needed and probiotic daily, gas x as needed.   DIETARY INTAKE:  Usual eating pattern includes 3 meals and 1 snacks per day. 24-hr recall:  B ( AM): Oatmeal, raisins, skim milk, 1/2 cup grape juice Snk ( AM):   L ( PM): PB and Jelly on [employer] bread, apple but switched after severe gas pain to tuna and mayo on sourdough bread, tomato, peppermint tea, 2 mandarin oranges daily Snk ( PM):  D ( PM): meat, mashed potatoes or mac and cheese, peas or other vegetables and occasional salad (iceburg) OR BBQ OR hotdog OR steak sub, decaf coffee with sugar Snk ( PM): ice cream (lactose free) OR crackers and Velveeta   cheese Beverages: water, warm peppermint tea, sweet iced tea, occasional coke, occasional gingerale, gatorade, decaf coffee  Usual physical activity: golf once per week, recumbent bike 6 days per week for 18-20 minutes,  Stretching each morning, calisthenics   Estimated energy needs: 1900-2000 calories 65-75 g protein  Progress Towards Goal(s):  In progress.   Nutritional Diagnosis:  NB-1.1 Food and nutrition-related knowledge deficit As related to constipation, gas and bloating.  As evidenced by patient report.    Intervention:  Nutrition counseling/education related tips to help with constipation, bloating and gas.  Discussed slowly increasing fiber, drinking adequate fluid, trying food sources of probiotics in addition to pill form.  Discussed to make changes in steps to see if any one food is causing the problem.   Adequate fluid intake  Consider trying Activia Yogurt 2 times per day for 2 weeks. Consider other sources of Probiotics from food such as Sheila Oats. (Lifeway brand) Consider adding a small amount (1 tsp) of Ground Flax Seeds to your oatmeal and increase this slowly to 2-3 tsp daily.  Add berries or cherries, greens as tolerated. Consider warm prune juice rather than grape juice in the am. Consider limiting cheese  Be sure you are drinking enough fluid daily. Make one change at a time to see what is helpful.  1.  Activia yogurt 2 daily for 2 weeks  2.  Try blueberries or other fruit added with breakfast OR warm prune  juice  3.  Add ground flax seeds to your oatmeal  4.  Increase your vegetable and fruit intake slowly  5.  More whole grains.  Teaching Method Utilized:  Visual Auditory Hands on  Handouts given during visit include:  Constipation nutrition therapy from AND  Constipation meal planning tips from AND  Barriers to learning/adherence to lifestyle change: none  Demonstrated degree of understanding via:  Teach Back   Monitoring/Evaluation:  Dietary  intake, exercise, and body weight prn.

## 2018-02-06 ENCOUNTER — Telehealth: Payer: Self-pay | Admitting: Dietician

## 2018-02-06 NOTE — Telephone Encounter (Signed)
Brief Nutrition Note Called and spoke to patient. Noted that patient has decided not to take the breath test at this time. He has tried the [employer] and likes this. Discussed that he may benefit from [place].  Patient to try this rather than align at this time. He will call for any questions.  Antonieta Iba, RD, LDN, CDE

## 2018-02-13 ENCOUNTER — Telehealth: Payer: Self-pay

## 2018-02-13 NOTE — Telephone Encounter (Signed)
Insurance will not approve Xifaxan per [facility].  I left a message on patient's voicemail making him aware of this.  I requested he call the office so that we may give him samples.

## 2018-02-15 ENCOUNTER — Telehealth: Payer: Self-pay | Admitting: Nurse Practitioner

## 2018-02-15 NOTE — Telephone Encounter (Signed)
He was concerned about the plan for receiving his test results. He is pleased he is getting the Xifaxan.

## 2018-02-19 ENCOUNTER — Ambulatory Visit
Admission: RE | Admit: 2018-02-19 | Discharge: 2018-02-19 | Disposition: A | Discharge status: Home / Self Care | Payer: Medicare HMO | Source: Ambulatory Visit | Attending: Physical Medicine and Rehabilitation | Admitting: Physical Medicine and Rehabilitation

## 2018-02-19 ENCOUNTER — Telehealth: Payer: Self-pay | Admitting: Dietician

## 2018-02-19 DIAGNOSIS — M5136 Other intervertebral disc degeneration, lumbar region: Secondary | ICD-10-CM

## 2018-02-19 DIAGNOSIS — M5126 Other intervertebral disc displacement, lumbar region: Secondary | ICD-10-CM | POA: Diagnosis not present

## 2018-02-19 DIAGNOSIS — M48061 Spinal stenosis, lumbar region without neurogenic claudication: Secondary | ICD-10-CM | POA: Diagnosis not present

## 2018-02-19 NOTE — Telephone Encounter (Signed)
Brief nutrition note Returned patient call.   Patient was not available at this time.  Left a message to have patient call back.  Antonieta Iba, RD, LDN, CDE

## 2018-02-20 NOTE — Telephone Encounter (Signed)
Brief Nutrition Note: Patient called for follow up. He decided not to take the breath test and did not take the Xifaxan. He tried the [employer] and although could not be specific states that he had a negative reaction while on this as well as it was expensive. Her reports that he has started the Activia Yogurt 2 times daily for the past 2 weeks and continues to take the Align probiotic. He has not taken the Murelax for the past 4 days and has had normal bowel movements daily up to 3 times per day and has no gas or bloating.  He states that he feels normal for the first time in months. He did not change anything else about his diet at this time. He will continue the Activia Yogurt and add his favorite meal back (PB &J sandwich and apple).  He thanks [place] for the visit.  Patient to call for any questions.  Antonieta Iba, RD, LDN, CDE

## 2018-02-21 ENCOUNTER — Other Ambulatory Visit: Payer: Self-pay | Admitting: Family Medicine

## 2018-02-21 DIAGNOSIS — I10 Essential (primary) hypertension: Secondary | ICD-10-CM

## 2018-02-27 ENCOUNTER — Telehealth: Payer: Self-pay

## 2018-02-27 ENCOUNTER — Telehealth: Payer: Self-pay | Admitting: Dietician

## 2018-02-27 ENCOUNTER — Telehealth (INDEPENDENT_AMBULATORY_CARE_PROVIDER_SITE_OTHER): Payer: Self-pay | Admitting: Physical Medicine and Rehabilitation

## 2018-02-27 NOTE — Telephone Encounter (Signed)
Patient wanted to discuss breath test and antibiotics again. Reassured patient that we would get samples of antibiotic if needed. He will let us know what he decides.

## 2018-02-27 NOTE — Telephone Encounter (Signed)
Brief Nutrition Note: Returned patient call.  Patient states that the gas pain has returned again and he is having intermittent problems with constipation which he is treating with Murelax.   He is continuing to eat the Activia Yogurt bid and take the Align probiotic daily.   He is now considering taking the breath test.   Suggested that this would be a good step.  He is not getting many fruits or vegetables and he could also try to increase these slowly and keep a food diary to note symptoms.  He states that he has no problems in the am but gets progressively worse throughout the day. Patient to call for an appointment as needed. Antonieta Iba, RD, LDN, CDE

## 2018-02-28 NOTE — Telephone Encounter (Signed)
Authorization [id number] Auth Effective Date:02/28/2018 Auth End Date:05/29/2018

## 2018-03-05 ENCOUNTER — Telehealth: Payer: Self-pay | Admitting: Nurse Practitioner

## 2018-03-05 ENCOUNTER — Telehealth (INDEPENDENT_AMBULATORY_CARE_PROVIDER_SITE_OTHER): Payer: Self-pay

## 2018-03-05 NOTE — Telephone Encounter (Signed)
Authorization for Monovisc right knee from [place] # [id number] Valid 01/11/18 thru 04/13/18.  Buy and bill allowed Patient will be responsible for 20% OOP. Co-pay of $25.00

## 2018-03-05 NOTE — Telephone Encounter (Signed)
Spoke with the patient. He has questions about the breath test. I have referred him to the number to the diagnostic lab for questions.

## 2018-03-05 NOTE — Telephone Encounter (Signed)
Patient scheduled for 9/10

## 2018-03-05 NOTE — Telephone Encounter (Signed)
Noted. Thank You so much.

## 2018-03-05 NOTE — Telephone Encounter (Signed)
Left a voicemail.

## 2018-03-08 ENCOUNTER — Telehealth: Payer: Self-pay | Admitting: Nurse Practitioner

## 2018-03-08 NOTE — Telephone Encounter (Signed)
Patient found a lab that does the breath test less expensive. It is "[facility]." He asks if we will send the lab order there.

## 2018-03-09 NOTE — Telephone Encounter (Signed)
Ok. Thanks!

## 2018-03-12 ENCOUNTER — Telehealth: Payer: Self-pay | Admitting: Nurse Practitioner

## 2018-03-12 NOTE — Telephone Encounter (Signed)
He is checking on the status of the IBO testing. Message has already been sent to [facility]. I have spoken with the patient.

## 2018-03-12 NOTE — Telephone Encounter (Signed)
The patient can order his on test if it is okay to use the glucose test. If you want the lactulose test, I have to set up an account and order it for the patient.

## 2018-03-19 ENCOUNTER — Ambulatory Visit (INDEPENDENT_AMBULATORY_CARE_PROVIDER_SITE_OTHER): Payer: Medicare HMO | Admitting: Physical Medicine and Rehabilitation

## 2018-03-19 ENCOUNTER — Ambulatory Visit (INDEPENDENT_AMBULATORY_CARE_PROVIDER_SITE_OTHER): Payer: Self-pay

## 2018-03-19 ENCOUNTER — Encounter (INDEPENDENT_AMBULATORY_CARE_PROVIDER_SITE_OTHER): Payer: Self-pay | Admitting: Physical Medicine and Rehabilitation

## 2018-03-19 VITALS — BP 165/100 | HR 76

## 2018-03-19 DIAGNOSIS — M5416 Radiculopathy, lumbar region: Secondary | ICD-10-CM

## 2018-03-19 MED ORDER — METHYLPREDNISOLONE ACETATE 80 MG/ML IJ SUSP
80.0000 mg | Freq: Once | INTRAMUSCULAR | Status: AC
  Administered 2018-03-19: 80 mg

## 2018-03-19 NOTE — Progress Notes (Signed)
 .  Numeric Pain Rating Scale and Functional Assessment Average Pain 7   In the last MONTH (on 0-10 scale) has pain interfered with the following?  1. General activity like being  able to carry out your everyday physical activities such as walking, climbing stairs, carrying groceries, or moving a chair?  Rating(4)   +Driver, -BT, -Dye Allergies.  

## 2018-03-19 NOTE — Patient Instructions (Signed)

## 2018-03-20 ENCOUNTER — Telehealth: Payer: Self-pay

## 2018-03-20 NOTE — Telephone Encounter (Signed)
Pt called and is requesting a refill on his desonide pt would like it sent to the [place], [place] pt can be reached at [phone]

## 2018-03-21 MED ORDER — DESONIDE 0.05 % EX CREA: TOPICAL_CREAM | CUTANEOUS | 1 refills | Status: DC

## 2018-03-21 NOTE — Telephone Encounter (Signed)
Pt wants to know if he could have a refill on his desonide. Pt is coming close to the year lab result guideline . plase advise If I can fill for pt Thanks [facility]

## 2018-03-21 NOTE — Telephone Encounter (Signed)
[name], [facility] has an Financial controller GI listed as having an account.  Account [phone] The test must be paid for when ordered. I explained the patient will be paying the costs. Agent instructs to have the patient call with the account number [id number] and the name of the GI doctor which is Dr Scarlette Shorts.  I have called the patient's home and left this information on his voicemail.

## 2018-03-22 DIAGNOSIS — R69 Illness, unspecified: Secondary | ICD-10-CM | POA: Diagnosis not present

## 2018-03-30 ENCOUNTER — Encounter: Payer: Self-pay | Admitting: Internal Medicine

## 2018-04-02 NOTE — Procedures (Signed)
Lumbar Epidural Steroid Injection - Interlaminar Approach with Fluoroscopic Guidance  Patient: [name]      Date of Birth: September 08, 1931 MRN: [id number] PCP: Denita Lung, MD      Visit Date: 03/19/2018   Universal Protocol:     Consent Given By: the patient  Position: PRONE  Additional Comments: Vital signs were monitored before and after the procedure. Patient was prepped and draped in the usual sterile fashion. The correct patient, procedure, and site was verified.   Injection Procedure Details:  Procedure Site One Meds Administered:  Meds ordered this encounter  Medications  . methylPREDNISolone acetate (DEPO-MEDROL) injection 80 mg     Laterality: Right  Location/Site:  L4-L5  Needle size: 20 G  Needle type: Tuohy  Needle Placement: Paramedian epidural  Findings:   -Comments: Excellent flow of contrast into the epidural space.  Procedure Details: Using a paramedian approach from the side mentioned above, the region overlying the inferior lamina was localized under fluoroscopic visualization and the soft tissues overlying this structure were infiltrated with 4 ml. of 1% Lidocaine without Epinephrine. The Tuohy needle was inserted into the epidural space using a paramedian approach.   The epidural space was localized using loss of resistance along with lateral and bi-planar fluoroscopic views.  After negative aspirate for air, blood, and CSF, a 2 ml. volume of Isovue-250 was injected into the epidural space and the flow of contrast was observed. Radiographs were obtained for documentation purposes.    The injectate was administered into the level noted above.   Additional Comments:  The patient tolerated the procedure well Dressing: Band-Aid    Post-procedure details: Patient was observed during the procedure. Post-procedure instructions were reviewed.  Patient left the clinic in stable condition.

## 2018-04-02 NOTE — Progress Notes (Signed)
[name] - 84 y.o. male MRN [id number]  Date of birth: [birth year]  Office Visit Note: Visit Date: 03/19/2018 PCP: Denita Lung, MD Referred by: Denita Lung, MD  Subjective: Chief Complaint  Patient presents with  . Lower Back - Weakness   HPI: [name] is an 84 year old gentleman who is very active but complains of what he refers to his lumbar spine or back weakness with standing and ambulating.  He is to ambulate with a forward flexed spine using a grocery cart at the store that does seem to relieve some of his symptoms.  He said no focal weakness in the legs.  He has been followed by orthopedics here for orthopedic complaints for some time.  Skin is really fairly active.  No radicular pain or paresthesias.  No focal weakness or foot drop no bowel or bladder problems and no trauma.  No real red flag complaints.  He does report the use of ibuprofen seems to help with what he refers to as the weakness.  He rates his average pain as a 7 out of 10 but he really refers that is not his pain but weakness.  Again no paresthesias or foot drop.  It does really limit what he can do at times in terms of daily activities.  He has tried and failed other conservative care.  Prior hip injections were not very beneficial.  We did obtain CT scan of the lumbar spine which is reviewed with the patient today.  We spent 20 minutes reviewing the MRI findings which included multilevel degenerative facet arthropathy as well as moderate stenosis at L4-5.  We discussed the treatment options with him in a face-to-face evaluation.   Review of Systems  Constitutional: Negative for chills, fever, malaise/fatigue and weight loss.  HENT: Negative for hearing loss and sinus pain.   Eyes: Negative for blurred vision, double vision and photophobia.  Respiratory: Negative for cough and shortness of breath.   Cardiovascular: Negative for chest pain, palpitations and leg swelling.  Gastrointestinal: Negative for  abdominal pain, nausea and vomiting.  Genitourinary: Negative for flank pain.  Musculoskeletal: Positive for back pain. Negative for myalgias.  Skin: Negative for itching and rash.  Neurological: Positive for weakness. Negative for tingling, tremors, sensory change and focal weakness.  Endo/Heme/Allergies: Negative.   Psychiatric/Behavioral: Negative for depression.  All other systems reviewed and are negative.  Otherwise per HPI.  Assessment & Plan: Visit Diagnoses:  1. Lumbar radiculopathy     Plan: Findings:  Chronic worsening low back pain but more what he refers to his low back weakness.  Is very consistent however with lumbar stenosis and claudication without particular radicular pain down the legs.  He gets symptoms that if he did have pain will be very consistent with the stenosis he has at L4-5.  We reviewed his CT scan with him today face-to-face evaluation with plan for him in terms of activity modification and exercise.  We are going to complete a diagnostic of therapeutic L4-5 interlaminar injection today.    Meds & Orders:  Meds ordered this encounter  Medications  . methylPREDNISolone acetate (DEPO-MEDROL) injection 80 mg    Orders Placed This Encounter  Procedures  . XR C-ARM NO REPORT  . Epidural Steroid injection    Follow-up: Return if symptoms worsen or fail to improve.   Procedures: No procedures performed  Lumbar Epidural Steroid Injection - Interlaminar Approach with Fluoroscopic Guidance  Patient: [name]  Date of Birth: 82-May-1933 MRN: [id number] PCP: Denita Lung, MD      Visit Date: 03/19/2018   Universal Protocol:     Consent Given By: the patient  Position: PRONE  Additional Comments: Vital signs were monitored before and after the procedure. Patient was prepped and draped in the usual sterile fashion. The correct patient, procedure, and site was verified.   Injection Procedure Details:  Procedure Site One Meds  Administered:  Meds ordered this encounter  Medications  . methylPREDNISolone acetate (DEPO-MEDROL) injection 80 mg     Laterality: Right  Location/Site:  L4-L5  Needle size: 20 G  Needle type: Tuohy  Needle Placement: Paramedian epidural  Findings:   -Comments: Excellent flow of contrast into the epidural space.  Procedure Details: Using a paramedian approach from the side mentioned above, the region overlying the inferior lamina was localized under fluoroscopic visualization and the soft tissues overlying this structure were infiltrated with 4 ml. of 1% Lidocaine without Epinephrine. The Tuohy needle was inserted into the epidural space using a paramedian approach.   The epidural space was localized using loss of resistance along with lateral and bi-planar fluoroscopic views.  After negative aspirate for air, blood, and CSF, a 2 ml. volume of Isovue-250 was injected into the epidural space and the flow of contrast was observed. Radiographs were obtained for documentation purposes.    The injectate was administered into the level noted above.   Additional Comments:  The patient tolerated the procedure well Dressing: Band-Aid    Post-procedure details: Patient was observed during the procedure. Post-procedure instructions were reviewed.  Patient left the clinic in stable condition.   Clinical History: CT LUMBAR SPINE WITHOUT CONTRAST  TECHNIQUE: Multidetector CT imaging of the lumbar spine was performed without intravenous contrast administration. Multiplanar CT image reconstructions were also generated.  COMPARISON:  Lumbar spine radiographs 02/01/2018  FINDINGS: Segmentation: Normal  Alignment: Mild retrolisthesis L1-2, L2-3, L3-4. Mild anterolisthesis L4-5 approximately 4 mm. 5 mm retrolisthesis L5-S1. Mild lumbar scoliosis.  Vertebrae: Negative for fracture or mass  Paraspinal and other soft tissues: Heavily calcified aorta and iliac arteries  without aneurysm. No retroperitoneal adenopathy or mass  Disc levels: T11-12: Bilateral facet degeneration. Negative for stenosis  L1-2: Advanced disc degeneration with disc space narrowing and mild spurring. Mild facet degeneration. Moderate right foraminal stenosis due to spurring  L1-2: Advanced disc degeneration with disc space narrowing. Diffuse disc bulging. Moderate right foraminal encroachment due to vertebral endplate spurring and facet hypertrophy.  L2-3: Advanced disc degeneration with fusion of the disc space on the right. Mild foraminal stenosis bilaterally.  L3-4: Disc degeneration and spondylosis. Bilateral facet hypertrophy. Severe left foraminal encroachment and mild right foraminal encroachment due to spurring  L4-5: 4 mm anterolisthesis with advanced disc degeneration. Advanced facet hypertrophy bilaterally. Moderate central canal stenosis. Severe subarticular stenosis bilaterally with severe left foraminal encroachment.  L5-S1: 5 mm retrolisthesis with advanced disc degeneration. Diffuse endplate spurring and mild facet hypertrophy. Left-sided facet degeneration and spurring projecting into the spinal canal and likely affecting the left L5 nerve root. Moderate subarticular stenosis and foraminal stenosis bilaterally due to spurring  IMPRESSION: Advanced multilevel degenerative change throughout the lumbar spine with extensive disc space narrowing and spurring. Multilevel foraminal encroachment due to spurring as described above. Moderate central canal stenosis at L4-5.  Atherosclerotic aorta   Electronically Signed   By: Franchot Gallo M.D.   On: 02/19/2018 13:32   He reports that he quit smoking about 55 years ago. His  smoking use included cigarettes. He has a 19.50 pack-year smoking history. He has never used smokeless tobacco. No results for input(s): HGBA1C, LABURIC in the last 8760 hours.  Objective:  VS:  HT:    WT:   BMI:      BP:(!) 165/100  HR:76bpm  TEMP: ( )  RESP:  Physical Exam  Constitutional: He is oriented to person, place, and time. He appears well-developed and well-nourished. No distress.  HENT:  Head: Normocephalic and atraumatic.  Eyes: Pupils are equal, round, and reactive to light. Conjunctivae are normal.  Neck: Normal range of motion. Neck supple.  Cardiovascular: Regular rhythm and intact distal pulses.  Pulmonary/Chest: Effort normal. No respiratory distress.  Musculoskeletal:  Patient stands with forward flexed lumbar spine.  He has a lot of stiffness with extension with some pain.  No trigger points noted.  No pain over the PSIS or greater trochanters.  No pain with hip rotation and good distal strength.  Neurological: He is alert and oriented to person, place, and time. He exhibits normal muscle tone. Coordination normal.  Skin: Skin is warm and dry. No rash noted. No erythema.  Psychiatric: He has a normal mood and affect.  Nursing note and vitals reviewed.   Ortho Exam Imaging: No results found.  Past Medical/Family/Surgical/Social History: Medications & Allergies reviewed per EMR, new medications updated. Patient Active Problem List   Diagnosis Date Noted  . Pacemaker 04/21/2017  . Complete heart block ([place]) 01/11/2017  . Acute pain of left shoulder 08/02/2016  . History of prostate cancer 12/27/2010  . Arthritis 12/27/2010  . Hx of cataract surgery 12/27/2010  . HH (hiatus hernia)   . History of colonic polyps   . Hyperlipidemia LDL goal <130 04/21/2008  . Allergic rhinitis 04/21/2008  . Essential hypertension 04/18/2008   Past Medical History:  Diagnosis Date  . 1st degree AV block   . Arthritis    "hands, back, neck, shoulders, knees" (01/11/2017)  . Eczema   . GERD (gastroesophageal reflux disease)   . HH (hiatus hernia)   . History of blood transfusion    "maybe; w/prostate OR; they would have used my own" (01/11/2017)  . History of colonic polyps    "I don't  think I've had any polyps" (01/11/2017)  . Hypercholesterolemia   . Hypertension   . Presence of permanent cardiac pacemaker   . Prostate cancer ([place]) 1997  . Seasonal allergies   . SVT (supraventricular tachycardia) (HCC)    Family History  Problem Relation Age of Onset  . Cancer Mother   . Alcoholism Father    Past Surgical History:  Procedure Laterality Date  . AV NODE ABLATION  2000s X 2   AVNODE REENTRANT ABLATION  . CATARACT EXTRACTION W/ INTRAOCULAR LENS  IMPLANT, BILATERAL    . COLONOSCOPY  2007   MAGOD  . INSERT / REPLACE / REMOVE PACEMAKER  01/11/2017  . PACEMAKER IMPLANT N/A 01/11/2017   Procedure: Pacemaker Implant;  Surgeon: Evans Lance, MD;  Location: [facility];  Service: Cardiovascular;  Laterality: N/A;  . PROSTATECTOMY    . TESTICLE SURGERY Left 2010   "benign growth"; KIMBROUGH  . TONSILLECTOMY     Social History   Occupational History  . Not on file  Tobacco Use  . Smoking status: Former Smoker    Packs/day: 1.50    Years: 13.00    Pack years: 19.50    Types: Cigarettes    Last attempt to quit: 08/25/1962  Years since quitting: 55.6  . Smokeless tobacco: Never Used  Substance and Sexual Activity  . Alcohol use: No  . Drug use: No  . Sexual activity: Never

## 2018-04-03 ENCOUNTER — Encounter (INDEPENDENT_AMBULATORY_CARE_PROVIDER_SITE_OTHER): Payer: Self-pay | Admitting: Orthopaedic Surgery

## 2018-04-03 ENCOUNTER — Ambulatory Visit (INDEPENDENT_AMBULATORY_CARE_PROVIDER_SITE_OTHER): Payer: Medicare HMO | Admitting: Orthopaedic Surgery

## 2018-04-03 DIAGNOSIS — M1711 Unilateral primary osteoarthritis, right knee: Secondary | ICD-10-CM

## 2018-04-03 MED ORDER — HYALURONAN 88 MG/4ML IX SOSY
88.0000 mg | PREFILLED_SYRINGE | INTRA_ARTICULAR | Status: AC | PRN
  Administered 2018-04-03: 88 mg via INTRA_ARTICULAR

## 2018-04-03 NOTE — Progress Notes (Signed)
   Procedure Note  Patient: [name]             Date of Birth: Nov 05, 1931           MRN: [id number]             Visit Date: 04/03/2018  Procedures: Visit Diagnoses: Unilateral primary osteoarthritis, right knee  Large Joint Inj: R knee on 04/03/2018 10:16 AM Indications: pain Details: 22 G needle  Arthrogram: No  Medications: 88 mg Hyaluronan 88 MG/4ML Outcome: tolerated well, no immediate complications Patient was prepped and draped in the usual sterile fashion.

## 2018-04-09 ENCOUNTER — Telehealth: Payer: Self-pay | Admitting: Nurse Practitioner

## 2018-04-09 ENCOUNTER — Encounter (INDEPENDENT_AMBULATORY_CARE_PROVIDER_SITE_OTHER): Payer: Medicare HMO | Admitting: Ophthalmology

## 2018-04-09 DIAGNOSIS — H43813 Vitreous degeneration, bilateral: Secondary | ICD-10-CM

## 2018-04-09 DIAGNOSIS — H35033 Hypertensive retinopathy, bilateral: Secondary | ICD-10-CM

## 2018-04-09 DIAGNOSIS — D3131 Benign neoplasm of right choroid: Secondary | ICD-10-CM | POA: Diagnosis not present

## 2018-04-09 DIAGNOSIS — H35371 Puckering of macula, right eye: Secondary | ICD-10-CM | POA: Diagnosis not present

## 2018-04-09 DIAGNOSIS — I10 Essential (primary) hypertension: Secondary | ICD-10-CM

## 2018-04-09 DIAGNOSIS — H348322 Tributary (branch) retinal vein occlusion, left eye, stable: Secondary | ICD-10-CM

## 2018-04-09 NOTE — Telephone Encounter (Signed)
A user error has taken place.

## 2018-04-10 ENCOUNTER — Telehealth: Payer: Self-pay | Admitting: Nurse Practitioner

## 2018-04-10 NOTE — Telephone Encounter (Signed)
Pt calling regarding results of breath test. Lab told him that they faxed results over yesterday.

## 2018-04-10 NOTE — Telephone Encounter (Signed)
Told the patient that I will look for the test results.

## 2018-04-16 ENCOUNTER — Telehealth: Payer: Self-pay | Admitting: Nurse Practitioner

## 2018-04-16 ENCOUNTER — Telehealth: Payer: Self-pay

## 2018-04-16 ENCOUNTER — Other Ambulatory Visit: Payer: Self-pay

## 2018-04-16 DIAGNOSIS — K6389 Other specified diseases of intestine: Secondary | ICD-10-CM

## 2018-04-16 DIAGNOSIS — R140 Abdominal distension (gaseous): Secondary | ICD-10-CM

## 2018-04-16 MED ORDER — METRONIDAZOLE 500 MG PO TABS: 500.0000 mg | ORAL_TABLET | Freq: Three times a day (TID) | ORAL | 0 refills | Status: AC

## 2018-04-16 MED ORDER — RIFAXIMIN 550 MG PO TABS: 550.0000 mg | ORAL_TABLET | Freq: Three times a day (TID) | ORAL | 0 refills | Status: AC

## 2018-04-16 NOTE — Telephone Encounter (Signed)
Insurance will not cover Xifaxan.  Patient informed. Samples given # 42.  Patient will pick up tomorrow.

## 2018-04-16 NOTE — Telephone Encounter (Signed)
  Spoke with the patient. He is aware of the breath test results. Patient denies constipation. He does want to go forward with treatment of SIBO.  Spoke with Tye Savoy, NP. Will treat with Xifaxan and Flagyl for 14 days. Rx to [facility].

## 2018-04-17 ENCOUNTER — Telehealth: Payer: Self-pay

## 2018-04-17 NOTE — Telephone Encounter (Signed)
I called patient back after talking with clinical nursing staff. Okay to take Xifaxan and Flagyl together with or after meals.  Patient verbalized understanding.

## 2018-04-17 NOTE — Telephone Encounter (Signed)
Wants to know how to take the Xifaxan. He already picked it up.

## 2018-04-19 ENCOUNTER — Ambulatory Visit (INDEPENDENT_AMBULATORY_CARE_PROVIDER_SITE_OTHER): Payer: Medicare HMO | Admitting: *Deleted

## 2018-04-19 DIAGNOSIS — I442 Atrioventricular block, complete: Secondary | ICD-10-CM | POA: Diagnosis not present

## 2018-04-19 NOTE — Progress Notes (Signed)
Remote pacemaker transmission.   

## 2018-04-20 ENCOUNTER — Encounter: Payer: Self-pay | Admitting: Cardiology

## 2018-04-23 ENCOUNTER — Encounter: Payer: Self-pay | Admitting: Family Medicine

## 2018-04-23 ENCOUNTER — Ambulatory Visit (INDEPENDENT_AMBULATORY_CARE_PROVIDER_SITE_OTHER): Payer: Medicare HMO | Admitting: Family Medicine

## 2018-04-23 VITALS — BP 126/80 | HR 75 | Temp 97.5°F | Ht 67.0 in | Wt 154.8 lb

## 2018-04-23 DIAGNOSIS — Z8546 Personal history of malignant neoplasm of prostate: Secondary | ICD-10-CM

## 2018-04-23 DIAGNOSIS — J309 Allergic rhinitis, unspecified: Secondary | ICD-10-CM

## 2018-04-23 DIAGNOSIS — I10 Essential (primary) hypertension: Secondary | ICD-10-CM

## 2018-04-23 DIAGNOSIS — E785 Hyperlipidemia, unspecified: Secondary | ICD-10-CM

## 2018-04-23 DIAGNOSIS — I442 Atrioventricular block, complete: Secondary | ICD-10-CM

## 2018-04-23 DIAGNOSIS — Z950 Presence of cardiac pacemaker: Secondary | ICD-10-CM

## 2018-04-23 DIAGNOSIS — M1990 Unspecified osteoarthritis, unspecified site: Secondary | ICD-10-CM | POA: Diagnosis not present

## 2018-04-23 DIAGNOSIS — Z860100 Personal history of colon polyps, unspecified: Secondary | ICD-10-CM

## 2018-04-23 DIAGNOSIS — Z0000 Encounter for general adult medical examination without abnormal findings: Secondary | ICD-10-CM | POA: Diagnosis not present

## 2018-04-23 DIAGNOSIS — K449 Diaphragmatic hernia without obstruction or gangrene: Secondary | ICD-10-CM | POA: Diagnosis not present

## 2018-04-23 MED ORDER — AMLODIPINE BESYLATE 5 MG PO TABS: 5.0000 mg | ORAL_TABLET | Freq: Every day | ORAL | 0 refills | Status: DC

## 2018-04-23 MED ORDER — OMEPRAZOLE 20 MG PO CPDR: DELAYED_RELEASE_CAPSULE | ORAL | 3 refills | Status: DC

## 2018-04-23 MED ORDER — PRAVASTATIN SODIUM 40 MG PO TABS: 40.0000 mg | ORAL_TABLET | Freq: Every day | ORAL | 3 refills | Status: DC

## 2018-04-23 NOTE — Progress Notes (Signed)
[name] is a 84 y.o. male who presents for annual wellness visit, CPE and follow-up on chronic medical conditions.  He has been evaluated recently by GI and is now being treated for SIBO with Xifaxan and metronidazole.  He thinks that it might be helping him.  He continues to be followed by cardiology for history of complete heart block.  He was also seen recently by orthopedics and by PMR.  He did have an epidural injection.  He has a previous history of colonic polyps but apparently no further intervention is needed at this point.  He is followed regularly by his ophthalmologist.  Otherwise he seems to be doing fairly well.   Immunizations and Health Maintenance Immunization History  Administered Date(s) Administered  . Pneumococcal Conjugate-13 01/20/2014  . Pneumococcal Polysaccharide-23 01/03/2012  . Tdap 05/25/2000, 01/03/2012   There are no preventive care reminders to display for this patient.  Last colonoscopy:N/A Last PSA:N/A Dentist:six months ago Ophtho: 2019 Exercise:qd for 30 min  Other doctors caring for patient include:Xu Rudell Cobb  Advanced Directives:yes Does Patient Have a Medical Advance Directive?: Yes Type of Advance Directive: Healthcare Power of Attorney, Living will Does patient want to make changes to medical advance directive?: No - Patient declined Copy of [place] in Chart?: Yes  Depression screen:  See questionnaire below.     Depression screen [facility] 2/9 04/23/2018 02/05/2018 04/21/2017 03/14/2016 03/10/2015  Decreased Interest 0 0 0 0 0  Down, Depressed, Hopeless 0 0 0 0 0  PHQ - 2 Score 0 0 0 0 0    Fall Screen: See Questionaire below.   Fall Risk  04/23/2018 02/05/2018 04/21/2017 03/14/2016 03/10/2015  Falls in the past year? No No No No No    ADL screen:  See questionnaire below.  Functional Status Survey: Is the patient deaf or have difficulty hearing?: No Does the patient have difficulty seeing, even when  wearing glasses/contacts?: No Does the patient have difficulty concentrating, remembering, or making decisions?: No Does the patient have difficulty walking or climbing stairs?: No Does the patient have difficulty dressing or bathing?: No Does the patient have difficulty doing errands alone such as visiting a doctor's office or shopping?: No   Review of Systems  Constitutional: -, -unexpected weight change, -anorexia, -fatigue Allergy: -sneezing, -itching, -congestion Dermatology: denies changing moles, rash, lumps ENT: -runny nose, -ear pain, -sore throat,  Cardiology:  -chest pain, -palpitations, -orthopnea, Respiratory: -cough, -shortness of breath, -dyspnea on exertion, -wheezing,  Gastroenterology: -abdominal pain, -nausea, -vomiting, -diarrhea, -constipation, -dysphagia Hematology: -bleeding or bruising problems Musculoskeletal: -arthralgias, -myalgias, -joint swelling, -back pain, - Ophthalmology: -vision changes,  Urology: -dysuria, -difficulty urinating,  -urinary frequency, -urgency, incontinence Neurology: -, -numbness, , -memory loss, -falls, -dizziness    PHYSICAL EXAM:   General Appearance: Alert, cooperative, no distress, appears stated age Head: Normocephalic, without obvious abnormality, atraumatic Eyes: PERRL, conjunctiva/corneas clear, EOM's intact, fundi benign Ears: Normal TM's and external ear canals Nose: Nares normal, mucosa normal, no drainage or sinus   tenderness Throat: Lips, mucosa, and tongue normal; teeth and gums normal Neck: Supple, no lymphadenopathy, thyroid:no enlargement/tenderness/nodules; no carotid bruit or JVD Lungs: Clear to auscultation bilaterally without wheezes, rales or ronchi; respirations unlabored Heart: Regular rate and rhythm, S1 and S2 normal, no murmur, rub or gallop Abdomen: Soft, non-tender, nondistended, normoactive bowel sounds, no masses, no hepatosplenomegaly Extremities: No clubbing, cyanosis or edema Pulses: 2+ and  symmetric all extremities Skin: Skin color, texture, turgor normal, no rashes or  lesions Lymph nodes: Cervical, supraclavicular, and axillary nodes normal Neurologic: CNII-XII intact, normal strength, sensation and gait; reflexes 2+ and symmetric throughout   Psych: Normal mood, affect, hygiene and grooming  ASSESSMENT/PLAN: Routine general medical examination at a health care facility - Plan: CBC with Differential/Platelet, Comprehensive metabolic panel, Lipid panel  Allergic rhinitis, unspecified seasonality, unspecified trigger  Arthritis  Complete heart block (HCC)  Essential hypertension - Plan: CBC with Differential/Platelet, Comprehensive metabolic panel, amLODipine (NORVASC) 5 MG tablet  HH (hiatus hernia) - Plan: omeprazole (PRILOSEC) 20 MG capsule  Pacemaker  Hyperlipidemia LDL goal <130 - Plan: Lipid panel, pravastatin (PRAVACHOL) 40 MG tablet  History of colonic polyps  History of prostate cancer Continue to be followed by orthopedics as well as Dr. Ernestina Patches, cardiology and GI.    Medicare Attestation I have personally reviewed: The patient's medical and social history Their use of alcohol, tobacco or illicit drugs Their current medications and supplements The patient's functional ability including ADLs,fall risks, home safety risks, cognitive, and hearing and visual impairment Diet and physical activities Evidence for depression or mood disorders  The patient's weight, height, and BMI have been recorded in the chart.  I have made referrals, counseling, and provided education to the patient based on review of the above and I have provided the patient with a written personalized care plan for preventive services.     Jill Alexanders, MD   04/23/2018

## 2018-04-24 LAB — COMPREHENSIVE METABOLIC PANEL
A/G RATIO: 2 (ref 1.2–2.2)
ALT: 32 IU/L (ref 0–44)
AST: 38 IU/L (ref 0–40)
Albumin: 4.7 g/dL (ref 3.5–4.7)
Alkaline Phosphatase: 93 IU/L (ref 39–117)
BILIRUBIN TOTAL: 0.3 mg/dL (ref 0.0–1.2)
BUN/Creatinine Ratio: 17 (ref 10–24)
BUN: 18 mg/dL (ref 8–27)
CALCIUM: 10.1 mg/dL (ref 8.6–10.2)
CHLORIDE: 96 mmol/L (ref 96–106)
CO2: 28 mmol/L (ref 20–29)
Creatinine, Ser: 1.04 mg/dL (ref 0.76–1.27)
GFR calc Af Amer: 76 mL/min/{1.73_m2} (ref >59)
GFR calc non Af Amer: 66 mL/min/{1.73_m2} (ref >59)
GLUCOSE: 82 mg/dL (ref 65–99)
Globulin, Total: 2.3 g/dL (ref 1.5–4.5)
POTASSIUM: 4.4 mmol/L (ref 3.5–5.2)
Sodium: 139 mmol/L (ref 134–144)
Total Protein: 7 g/dL (ref 6.0–8.5)

## 2018-04-24 LAB — LIPID PANEL
Chol/HDL Ratio: 2.6 ratio (ref 0.0–5.0)
Cholesterol, Total: 166 mg/dL (ref 100–199)
HDL: 64 mg/dL (ref >39)
LDL Calculated: 73 mg/dL (ref 0–99)
TRIGLYCERIDES: 146 mg/dL (ref 0–149)
VLDL Cholesterol Cal: 29 mg/dL (ref 5–40)

## 2018-04-24 LAB — CBC WITH DIFFERENTIAL/PLATELET
BASOS ABS: 0 10*3/uL (ref 0.0–0.2)
Basos: 0 %
EOS (ABSOLUTE): 0 10*3/uL (ref 0.0–0.4)
Eos: 1 %
Hematocrit: 47.5 % (ref 37.5–51.0)
Hemoglobin: 16.3 g/dL (ref 13.0–17.7)
IMMATURE GRANULOCYTES: 0 %
Immature Grans (Abs): 0 10*3/uL (ref 0.0–0.1)
LYMPHS: 14 %
Lymphocytes Absolute: 1.1 10*3/uL (ref 0.7–3.1)
MCH: 30.3 pg (ref 26.6–33.0)
MCHC: 34.3 g/dL (ref 31.5–35.7)
MCV: 88 fL (ref 79–97)
MONOS ABS: 0.4 10*3/uL (ref 0.1–0.9)
Monocytes: 5 %
NEUTROS PCT: 80 %
Neutrophils Absolute: 6.1 10*3/uL (ref 1.4–7.0)
PLATELETS: 204 10*3/uL (ref 150–450)
RBC: 5.38 x10E6/uL (ref 4.14–5.80)
RDW: 13.7 % (ref 12.3–15.4)
WBC: 7.7 10*3/uL (ref 3.4–10.8)

## 2018-04-25 ENCOUNTER — Other Ambulatory Visit: Payer: Self-pay | Admitting: Family Medicine

## 2018-04-27 ENCOUNTER — Telehealth: Payer: Self-pay | Admitting: Nurse Practitioner

## 2018-04-30 NOTE — Telephone Encounter (Signed)
Samples of the Xifaxin has been given to the patient

## 2018-05-11 ENCOUNTER — Telehealth: Payer: Self-pay | Admitting: Nurse Practitioner

## 2018-05-11 NOTE — Telephone Encounter (Signed)
Patient did feel he had improvement on most days while on Xifaxan. He reports feeling very optimistic. He has been off of Xifaxan for approximately a week. Today he calls with complaints of a return of his bloating and excessive gas. He denies constipation.

## 2018-05-11 NOTE — Telephone Encounter (Signed)
Beth, please make him an appt with his primary GI. Thanks

## 2018-05-14 NOTE — Telephone Encounter (Signed)
Patient notified. Appointment scheduled for 06/15/18 with Dr Henrene Pastor at 10:00 am.

## 2018-05-25 ENCOUNTER — Telehealth: Payer: Self-pay | Admitting: Family Medicine

## 2018-05-25 DIAGNOSIS — I10 Essential (primary) hypertension: Secondary | ICD-10-CM

## 2018-05-25 DIAGNOSIS — K449 Diaphragmatic hernia without obstruction or gangrene: Secondary | ICD-10-CM

## 2018-05-25 DIAGNOSIS — E785 Hyperlipidemia, unspecified: Secondary | ICD-10-CM

## 2018-05-25 MED ORDER — HYDROCHLOROTHIAZIDE 12.5 MG PO TABS: 12.5000 mg | ORAL_TABLET | Freq: Every day | ORAL | 3 refills | Status: DC

## 2018-05-25 MED ORDER — OMEPRAZOLE 20 MG PO CPDR: DELAYED_RELEASE_CAPSULE | ORAL | 3 refills | Status: DC

## 2018-05-25 MED ORDER — AMLODIPINE BESYLATE 5 MG PO TABS: 5.0000 mg | ORAL_TABLET | Freq: Every day | ORAL | 3 refills | Status: DC

## 2018-05-25 MED ORDER — FLUTICASONE PROPIONATE 50 MCG/ACT NA SUSP: 2.0000 | Freq: Every day | NASAL | 3 refills | Status: DC

## 2018-05-25 MED ORDER — PRAVASTATIN SODIUM 40 MG PO TABS: 40.0000 mg | ORAL_TABLET | Freq: Every day | ORAL | 3 refills | Status: DC

## 2018-05-25 NOTE — Telephone Encounter (Signed)
Sent meds to [facility]

## 2018-05-25 NOTE — Telephone Encounter (Signed)
  We sent his meds to [facility] but he needs them sent to his mail order pharmacy [facility] He needs  HCTZ 12.5 Amlodipine Pravastatin flonase Omeprazole

## 2018-05-26 ENCOUNTER — Other Ambulatory Visit: Payer: Self-pay | Admitting: Family Medicine

## 2018-05-26 DIAGNOSIS — I10 Essential (primary) hypertension: Secondary | ICD-10-CM

## 2018-05-29 LAB — CUP PACEART REMOTE DEVICE CHECK
Battery Voltage: 3.02 V
Brady Statistic AP VP Percent: 58.68 %
Brady Statistic RA Percent Paced: 58.68 %
Brady Statistic RV Percent Paced: 96.67 %
Implantable Lead Implant Date: 20180620
Implantable Lead Location: 753860
Implantable Lead Model: 5076
Implantable Pulse Generator Implant Date: 20180620
Lead Channel Impedance Value: 323 Ohm
Lead Channel Impedance Value: 418 Ohm
Lead Channel Impedance Value: 456 Ohm
Lead Channel Pacing Threshold Amplitude: 0.375 V
Lead Channel Pacing Threshold Amplitude: 0.75 V
Lead Channel Pacing Threshold Pulse Width: 0.4 ms
Lead Channel Pacing Threshold Pulse Width: 0.4 ms
Lead Channel Sensing Intrinsic Amplitude: 4 mV
Lead Channel Setting Pacing Amplitude: 1.5 V
Lead Channel Setting Pacing Pulse Width: 0.4 ms
Lead Channel Setting Sensing Sensitivity: 2 mV
MDC IDC LEAD IMPLANT DT: 20180620
MDC IDC LEAD LOCATION: 753859
MDC IDC MSMT BATTERY REMAINING LONGEVITY: 118 mo
MDC IDC MSMT LEADCHNL RA IMPEDANCE VALUE: 380 Ohm
MDC IDC MSMT LEADCHNL RA SENSING INTR AMPL: 6.375 mV
MDC IDC MSMT LEADCHNL RA SENSING INTR AMPL: 6.375 mV
MDC IDC MSMT LEADCHNL RV SENSING INTR AMPL: 4 mV
MDC IDC SESS DTM: 20190926140219
MDC IDC SET LEADCHNL RV PACING AMPLITUDE: 2.5 V
MDC IDC STAT BRADY AP VS PERCENT: 0.06 %
MDC IDC STAT BRADY AS VP PERCENT: 37.99 %
MDC IDC STAT BRADY AS VS PERCENT: 3.27 %

## 2018-06-01 ENCOUNTER — Telehealth: Payer: Self-pay

## 2018-06-01 MED ORDER — DESONIDE 0.05 % EX CREA: TOPICAL_CREAM | CUTANEOUS | 1 refills | Status: DC

## 2018-06-01 NOTE — Telephone Encounter (Signed)
[facility] called and stated prescription needs to be sent to [facility] because it's no longer accepted via [facility].

## 2018-06-01 NOTE — Telephone Encounter (Signed)
Patient has called and requested Desonide be sent to [facility] mail order because they have the brand he wants.

## 2018-06-01 NOTE — Telephone Encounter (Signed)
Patient has called back and recommended his medication go to local pharmacy because he needs it sooner than later.

## 2018-06-01 NOTE — Telephone Encounter (Signed)
Patient has been using Desonide cream for many years and when he went to the pharmacy recently they gave him a different brand called Perrigo. He stated this brand does not help him. Patient was informed to contact [facility] to see of they have the [place] or if the gave him a different brand due to the cost.

## 2018-06-13 ENCOUNTER — Telehealth: Payer: Self-pay | Admitting: Nurse Practitioner

## 2018-06-15 ENCOUNTER — Other Ambulatory Visit (INDEPENDENT_AMBULATORY_CARE_PROVIDER_SITE_OTHER): Payer: Medicare HMO

## 2018-06-15 ENCOUNTER — Encounter: Payer: Self-pay | Admitting: Internal Medicine

## 2018-06-15 ENCOUNTER — Ambulatory Visit: Payer: Medicare HMO | Admitting: Internal Medicine

## 2018-06-15 VITALS — BP 134/84 | HR 68 | Ht 65.5 in | Wt 155.5 lb

## 2018-06-15 DIAGNOSIS — R143 Flatulence: Secondary | ICD-10-CM

## 2018-06-15 DIAGNOSIS — R140 Abdominal distension (gaseous): Secondary | ICD-10-CM

## 2018-06-15 DIAGNOSIS — R141 Gas pain: Secondary | ICD-10-CM

## 2018-06-15 LAB — IGA: IGA: 269 mg/dL (ref 68–378)

## 2018-06-15 NOTE — Progress Notes (Signed)
HISTORY OF PRESENT ILLNESS:  [name] is a 84 y.o. 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, presents today for follow-up regarding problems with flatulence that occur particularly in the afternoon after lunch.  Has been evaluated by the GI physician assistant for this issue June 2019.  See that dictation.  Patient is new to me.  Trial of Xifaxan was not helpful for more than 2 days.  He has tried BSL 3 as well as activity without significant change.  His problem seemingly started after complicated issues with regards to pacemaker placement.  No alarm or worrisome features.  Laboratories from April 23, 2018 are unremarkable including normal hemoglobin of 16.3.  Normal albumin at 4.7.  Previous endoscopic procedures elsewhere with [facility].  REVIEW OF SYSTEMS:  All non-GI ROS negative except for sinus allergy, arthritis, back pain, hearing problems  Past Medical History:  Diagnosis Date  . 1st degree AV block   . Arthritis    "hands, back, neck, shoulders, knees" (01/11/2017)  . Eczema   . GERD (gastroesophageal reflux disease)   . HH (hiatus hernia)   . History of blood transfusion    "maybe; w/prostate OR; they would have used my own" (01/11/2017)  . History of colonic polyps    "I don't think I've had any polyps" (01/11/2017)  . Hypercholesterolemia   . Hypertension   . Presence of permanent cardiac pacemaker   . Prostate cancer ([place]) 1997  . Seasonal allergies   . SVT (supraventricular tachycardia) (HCC)     Past Surgical History:  Procedure Laterality Date  . AV NODE ABLATION  2000s X 2   AVNODE REENTRANT ABLATION  . CATARACT EXTRACTION W/ INTRAOCULAR LENS  IMPLANT, BILATERAL    . COLONOSCOPY  2007   MAGOD  . INSERT / REPLACE / REMOVE PACEMAKER  01/11/2017  . PACEMAKER IMPLANT N/A 01/11/2017   Procedure: Pacemaker Implant;  Surgeon: Evans Lance, MD;  Location: [facility];  Service: Cardiovascular;  Laterality: N/A;  . PROSTATECTOMY     . TESTICLE SURGERY Left 2010   "benign growth"; KIMBROUGH  . TONSILLECTOMY      Social History [name]  reports that he quit smoking about 55 years ago. His smoking use included cigarettes. He has a 19.50 pack-year smoking history. He has never used smokeless tobacco. He reports that he does not drink alcohol or use drugs.  family history includes Alcoholism in his father; Cancer in his mother.  Allergies  Allergen Reactions  . Codeine Nausea And Vomiting       PHYSICAL EXAMINATION: Vital signs: BP 134/84 (BP Location: Left Arm, Patient Position: Sitting, Cuff Size: Normal)   Pulse 68   Ht 5' 5.5" (1.664 m) Comment: from previous height  Wt 155 lb 8 oz (70.5 kg)   BMI 25.48 kg/m   Constitutional: generally well-appearing, no acute distress Psychiatric: alert and oriented x3, cooperative Abdomen: Not reexamined Neuro: Alert and oriented  ASSESSMENT:  1.  Increased intestinal gas, principally flatulence.  No alarm features.  Have tried several measures to help with the bacterial flora.  These have not helped   PLAN:  1.  Extensive education today regarding intestinal gas 2.  Screen for celiac disease to rule out gluten enteropathy as a contributor 3.  Samples of probiotics restora given 4.  Literature on gas provided 5.  Literature on gas is reviewed and provided 6.  Anti-gas and flatulence dietary sheet provided 7.  GI follow-up as needed  20-minute spent face-to-face with the patient.  Nearly the entire time used for counseling regarding his problems with gas

## 2018-06-15 NOTE — Patient Instructions (Signed)
Your provider has requested that you go to the basement level for lab work before leaving today. Press "B" on the elevator. The lab is located at the first door on the left as you exit the elevator.  

## 2018-06-18 LAB — TISSUE TRANSGLUTAMINASE, IGA: (tTG) Ab, IgA: 1 U/mL

## 2018-06-19 NOTE — Telephone Encounter (Signed)
done

## 2018-06-25 ENCOUNTER — Telehealth: Payer: Self-pay | Admitting: Internal Medicine

## 2018-06-27 NOTE — Telephone Encounter (Signed)
Left message with patient's wife that patient could take his Restora any time of the day and not necessarily with food, unless it upsets his stomach.  Otherwise he can take it any time of day and with or without food.  Patient's wife agreed.

## 2018-07-02 DIAGNOSIS — Z0100 Encounter for examination of eyes and vision without abnormal findings: Secondary | ICD-10-CM | POA: Diagnosis not present

## 2018-07-02 DIAGNOSIS — R69 Illness, unspecified: Secondary | ICD-10-CM | POA: Diagnosis not present

## 2018-07-12 ENCOUNTER — Ambulatory Visit (INDEPENDENT_AMBULATORY_CARE_PROVIDER_SITE_OTHER): Payer: Medicare HMO | Admitting: Orthopaedic Surgery

## 2018-07-12 ENCOUNTER — Encounter (INDEPENDENT_AMBULATORY_CARE_PROVIDER_SITE_OTHER): Payer: Self-pay | Admitting: Orthopaedic Surgery

## 2018-07-12 ENCOUNTER — Ambulatory Visit (INDEPENDENT_AMBULATORY_CARE_PROVIDER_SITE_OTHER): Payer: Medicare HMO

## 2018-07-12 DIAGNOSIS — M1711 Unilateral primary osteoarthritis, right knee: Secondary | ICD-10-CM

## 2018-07-12 DIAGNOSIS — M25562 Pain in left knee: Secondary | ICD-10-CM | POA: Insufficient documentation

## 2018-07-12 DIAGNOSIS — M1712 Unilateral primary osteoarthritis, left knee: Secondary | ICD-10-CM

## 2018-07-12 MED ORDER — BUPIVACAINE HCL 0.25 % IJ SOLN
2.0000 mL | INTRAMUSCULAR | Status: AC | PRN
  Administered 2018-07-12: 2 mL via INTRA_ARTICULAR

## 2018-07-12 MED ORDER — LIDOCAINE HCL 1 % IJ SOLN
2.0000 mL | INTRAMUSCULAR | Status: AC | PRN
  Administered 2018-07-12: 2 mL

## 2018-07-12 MED ORDER — METHYLPREDNISOLONE ACETATE 40 MG/ML IJ SUSP
40.0000 mg | INTRAMUSCULAR | Status: AC | PRN
  Administered 2018-07-12: 40 mg via INTRA_ARTICULAR

## 2018-07-12 NOTE — Progress Notes (Signed)
Office Visit Note   Patient: [name]           Date of Birth: 1931-07-27           MRN: [id number] Visit Date: 07/12/2018              Requested by: Denita Lung, MD [place], [place] [address] PCP: Denita Lung, MD   Assessment & Plan: Visit Diagnoses:  1. Unilateral primary osteoarthritis, right knee   2. Primary osteoarthritis of left knee     Plan: Impression is bilateral knee osteoarthritis.  Patient would like to proceed with a cortisone injection to the right, more symptomatic knee today.  He will let us know if his left knee becomes more persistent or bothersome.  Follow-up with [place] as needed.  Follow-Up Instructions: Return if symptoms worsen or fail to improve.   Orders:  Orders Placed This Encounter  Procedures  . Large Joint Inj: R knee  . XR KNEE 3 VIEW LEFT  . XR KNEE 3 VIEW RIGHT   No orders of the defined types were placed in this encounter.     Procedures: Large Joint Inj: R knee on 07/12/2018 9:57 AM Indications: pain Details: 22 G needle, anterolateral approach Medications: 2 mL lidocaine 1 %; 2 mL bupivacaine 0.25 %; 40 mg methylPREDNISolone acetate 40 MG/ML      Clinical Data: No additional findings.   Subjective: Chief Complaint  Patient presents with  . Right Knee - Pain  . Left Knee - Pain    HPI patient is a pleasant 84-year-old gentleman who presents to our clinic today with bilateral knee pain right greater than left.  He has been having ongoing pain to the entire right knee for the past several years.  He does admit to intermittent pain worse with walking.  Rest does seem to improve his symptoms.  He was seen in our office in June of this past year where he was given a cortisone injection.  This moderately helped to dull things but was not long lasting.  He was seen again in September of this past year for viscosupplementation injection.  He did note moderately for symptoms initially, but these were not  long-lasting.  His pain is started to return and he would like a repeat cortisone injection today if possible.  In regards to the left knee, he does note new onset of occasional twinge medial aspect.  This is not very frequent and does not incredibly bothersome.  He does note having a remote cortisone injection about 5 years ago which did significantly help.  Review of Systems as detailed in HPI.  All others reviewed and are negative.   Objective: Vital Signs: There were no vitals taken for this visit.  Physical Exam well-developed well-nourished gentleman no acute distress.  Alert and oriented x3.  Ortho Exam examination of both knees show no effusion.  Range of motion 0 to 125 degrees.  No joint line tenderness.  He is stable valgus varus stress.  Neurovascularly intact distally.  Specialty Comments:  No specialty comments available.  Imaging: Xr Knee 3 View Left  Result Date: 07/12/2018 X-rays of the left knee show marked degenerative changes medial and patellofemoral compartments  Xr Knee 3 View Right  Result Date: 07/12/2018 X-rays of the right knee show bone-on-bone medial patellofemoral compartments    PMFS History: Patient Active Problem List   Diagnosis Date Noted  . Unilateral primary osteoarthritis, right knee 07/12/2018  . Acute pain  of left knee 07/12/2018  . Pacemaker 04/21/2017  . Complete heart block ([facility]) 01/11/2017  . Acute pain of left shoulder 08/02/2016  . History of prostate cancer 12/27/2010  . Arthritis 12/27/2010  . Hx of cataract surgery 12/27/2010  . HH (hiatus hernia)   . History of colonic polyps   . Hyperlipidemia LDL goal <130 04/21/2008  . Allergic rhinitis 04/21/2008  . Essential hypertension 04/18/2008   Past Medical History:  Diagnosis Date  . 1st degree AV block   . Arthritis    "hands, back, neck, shoulders, knees" (01/11/2017)  . Eczema   . GERD (gastroesophageal reflux disease)   . HH (hiatus hernia)   . History of blood  transfusion    "maybe; w/prostate OR; they would have used my own" (01/11/2017)  . History of colonic polyps    "I don't think I've had any polyps" (01/11/2017)  . Hypercholesterolemia   . Hypertension   . Presence of permanent cardiac pacemaker   . Prostate cancer ([facility]) 1997  . Seasonal allergies   . SVT (supraventricular tachycardia) ([facility])     Family History  Problem Relation Age of Onset  . Cancer Mother   . Alcoholism Father     Past Surgical History:  Procedure Laterality Date  . AV NODE ABLATION  2000s X 2   AVNODE REENTRANT ABLATION  . CATARACT EXTRACTION W/ INTRAOCULAR LENS  IMPLANT, BILATERAL    . COLONOSCOPY  2007   MAGOD  . INSERT / REPLACE / REMOVE PACEMAKER  01/11/2017  . PACEMAKER IMPLANT N/A 01/11/2017   Procedure: Pacemaker Implant;  Surgeon: Evans Lance, MD;  Location: [facility];  Service: Cardiovascular;  Laterality: N/A;  . PROSTATECTOMY    . TESTICLE SURGERY Left 2010   "benign growth"; [facility]  . TONSILLECTOMY     Social History   Occupational History  . Not on file  Tobacco Use  . Smoking status: Former Smoker    Packs/day: 1.50    Years: 13.00    Pack years: 19.50    Types: Cigarettes    Last attempt to quit: 08/25/1962    Years since quitting: 55.9  . Smokeless tobacco: Never Used  Substance and Sexual Activity  . Alcohol use: No  . Drug use: No  . Sexual activity: Never

## 2018-07-19 ENCOUNTER — Ambulatory Visit (INDEPENDENT_AMBULATORY_CARE_PROVIDER_SITE_OTHER): Payer: Medicare HMO

## 2018-07-19 DIAGNOSIS — I442 Atrioventricular block, complete: Secondary | ICD-10-CM

## 2018-07-19 LAB — CUP PACEART REMOTE DEVICE CHECK
Battery Remaining Longevity: 114 mo
Battery Voltage: 3.01 V
Brady Statistic AP VP Percent: 57.85 %
Brady Statistic AS VS Percent: 2.86 %
Brady Statistic RV Percent Paced: 97.07 %
Date Time Interrogation Session: 20191226123026
Implantable Lead Implant Date: 20180620
Implantable Lead Location: 753860
Implantable Pulse Generator Implant Date: 20180620
Lead Channel Impedance Value: 361 Ohm
Lead Channel Impedance Value: 399 Ohm
Lead Channel Pacing Threshold Amplitude: 0.75 V
Lead Channel Pacing Threshold Pulse Width: 0.4 ms
Lead Channel Sensing Intrinsic Amplitude: 5.3 mV
Lead Channel Sensing Intrinsic Amplitude: 5.6 mV
Lead Channel Setting Pacing Amplitude: 1.5 V
Lead Channel Setting Pacing Pulse Width: 0.4 ms
MDC IDC LEAD IMPLANT DT: 20180620
MDC IDC LEAD LOCATION: 753859
MDC IDC MSMT LEADCHNL RA IMPEDANCE VALUE: 437 Ohm
MDC IDC MSMT LEADCHNL RA PACING THRESHOLD AMPLITUDE: 0.5 V
MDC IDC MSMT LEADCHNL RV IMPEDANCE VALUE: 304 Ohm
MDC IDC MSMT LEADCHNL RV PACING THRESHOLD PULSEWIDTH: 0.4 ms
MDC IDC SET LEADCHNL RV PACING AMPLITUDE: 2.5 V
MDC IDC SET LEADCHNL RV SENSING SENSITIVITY: 2 mV
MDC IDC STAT BRADY AP VS PERCENT: 0.07 %
MDC IDC STAT BRADY AS VP PERCENT: 39.23 %
MDC IDC STAT BRADY RA PERCENT PACED: 57.91 %

## 2018-07-19 NOTE — Progress Notes (Signed)
Remote pacemaker transmission.   

## 2018-07-27 ENCOUNTER — Telehealth: Payer: Self-pay | Admitting: Family Medicine

## 2018-07-27 DIAGNOSIS — E785 Hyperlipidemia, unspecified: Secondary | ICD-10-CM

## 2018-07-27 DIAGNOSIS — I10 Essential (primary) hypertension: Secondary | ICD-10-CM

## 2018-07-27 MED ORDER — PRAVASTATIN SODIUM 40 MG PO TABS: 40.0000 mg | ORAL_TABLET | Freq: Every day | ORAL | 3 refills | Status: DC

## 2018-07-27 MED ORDER — AMLODIPINE BESYLATE 5 MG PO TABS: 5.0000 mg | ORAL_TABLET | Freq: Every day | ORAL | 3 refills | Status: DC

## 2018-07-27 MED ORDER — DESONIDE 0.05 % EX CREA: TOPICAL_CREAM | CUTANEOUS | 3 refills | Status: DC

## 2018-07-27 MED ORDER — HYDROCHLOROTHIAZIDE 12.5 MG PO TABS: 12.5000 mg | ORAL_TABLET | Freq: Every day | ORAL | 3 refills | Status: DC

## 2018-07-27 MED ORDER — FLUTICASONE PROPIONATE 50 MCG/ACT NA SUSP: 2.0000 | Freq: Every day | NASAL | 3 refills | Status: DC

## 2018-07-27 NOTE — Telephone Encounter (Signed)
   Fax refill request from [facility]    Pravastatin Amlodipine Fluticasone HCTZ Desonide

## 2018-08-06 NOTE — Telephone Encounter (Signed)
Done

## 2018-08-07 DIAGNOSIS — H40013 Open angle with borderline findings, low risk, bilateral: Secondary | ICD-10-CM | POA: Diagnosis not present

## 2018-08-07 DIAGNOSIS — H35363 Drusen (degenerative) of macula, bilateral: Secondary | ICD-10-CM | POA: Diagnosis not present

## 2018-08-07 DIAGNOSIS — H35033 Hypertensive retinopathy, bilateral: Secondary | ICD-10-CM | POA: Diagnosis not present

## 2018-08-07 DIAGNOSIS — H348122 Central retinal vein occlusion, left eye, stable: Secondary | ICD-10-CM | POA: Diagnosis not present

## 2018-08-07 LAB — HM DIABETES EYE EXAM

## 2018-08-08 ENCOUNTER — Encounter: Payer: Self-pay | Admitting: Family Medicine

## 2018-08-08 ENCOUNTER — Ambulatory Visit (INDEPENDENT_AMBULATORY_CARE_PROVIDER_SITE_OTHER): Payer: Medicare Other | Admitting: Family Medicine

## 2018-08-08 VITALS — BP 110/70 | HR 74 | Temp 98.1°F | Wt 157.6 lb

## 2018-08-08 DIAGNOSIS — H6122 Impacted cerumen, left ear: Secondary | ICD-10-CM

## 2018-08-08 NOTE — Progress Notes (Signed)
Chief Complaint  Patient presents with  . remove wax    remove wax from ears   Subjective:  [name] is a 85 y.o. male who presents for left ear discomfort for the past week. Reports history of wax buildup and having his ears cleaned out periodically. States it feels clogged.  Uses hearing aids. States the hearing aid will not go in. Used Debrox without improvement. No ear pain.   Denies fever, chills, headache, dizziness, rhinorrhea, nasal congestion, sore throat, cough.   No other aggravating or relieving factors.  No other c/o.  ROS as in subjective.   Objective: Vitals:   08/08/18 1049  BP: 110/70  Pulse: 74  Temp: 98.1 F (36.7 C)    General appearance: Alert, WD/WN, no distress, well appearing                             Skin: warm, no rash                           Head: no sinus tenderness                            Eyes: conjunctiva normal, corneas clear, PERRLA                            Ears: prior to lavage, left TM not visualized due to thick cerumen impaction in canal and hearing decreased compared to right, right TM normal. Post lavage: pearly TMs, external ear canals normal. Gross hearing improved.                           Nose: normal appearing             Mouth/throat: MMM, tongue normal                           Neck: supple, no adenopathy                               Assessment: Impacted cerumen of left ear   Plan: No sign of infection Left ear lavage performed by CMA, Sabrina, due to severe cerumen impaction of left ear canal.  He tolerated this well and post lavage exam was normal.  His hearing did improve and he reported feeling much better.  Follow-up as needed

## 2018-09-21 ENCOUNTER — Telehealth (INDEPENDENT_AMBULATORY_CARE_PROVIDER_SITE_OTHER): Payer: Self-pay | Admitting: Physical Medicine and Rehabilitation

## 2018-09-21 NOTE — Telephone Encounter (Signed)
[place] for eval plus repeat, can say we might not do injection based on eval

## 2018-09-24 NOTE — Telephone Encounter (Signed)
Called pt and lvm #1 

## 2018-09-24 NOTE — Telephone Encounter (Signed)
Notification or Prior Authorization is not required for the requested services  This [employer]vantage members plan does not currently require a prior authorization for [address]   Decision ID #:[id number]

## 2018-09-24 NOTE — Telephone Encounter (Signed)
Needs auth for [id number]. Not yet scheduled. See below for scheduling.

## 2018-09-24 NOTE — Telephone Encounter (Signed)
Scheduled for OV with possible injection on 3/23 at 1030 with driver.

## 2018-10-15 ENCOUNTER — Ambulatory Visit (INDEPENDENT_AMBULATORY_CARE_PROVIDER_SITE_OTHER): Payer: Medicare Other | Admitting: Physical Medicine and Rehabilitation

## 2018-10-18 ENCOUNTER — Other Ambulatory Visit: Payer: Self-pay

## 2018-10-18 ENCOUNTER — Ambulatory Visit (INDEPENDENT_AMBULATORY_CARE_PROVIDER_SITE_OTHER): Payer: Medicare Other | Admitting: *Deleted

## 2018-10-18 DIAGNOSIS — I442 Atrioventricular block, complete: Secondary | ICD-10-CM

## 2018-10-19 ENCOUNTER — Telehealth: Payer: Self-pay

## 2018-10-19 NOTE — Telephone Encounter (Signed)
Spoke with patient to remind of missed remote transmission 

## 2018-10-20 LAB — CUP PACEART REMOTE DEVICE CHECK
Battery Remaining Longevity: 111 mo
Battery Voltage: 3 V
Brady Statistic AP VP Percent: 62.31 %
Brady Statistic AP VS Percent: 0.06 %
Brady Statistic AS VP Percent: 35.41 %
Brady Statistic AS VS Percent: 2.22 %
Brady Statistic RA Percent Paced: 62.25 %
Brady Statistic RV Percent Paced: 97.72 %
Date Time Interrogation Session: 20200327132223
Implantable Lead Implant Date: 20180620
Implantable Lead Location: 753859
Implantable Lead Location: 753860
Implantable Lead Model: 5076
Implantable Lead Model: 5076
Implantable Pulse Generator Implant Date: 20180620
Lead Channel Impedance Value: 323 Ohm
Lead Channel Impedance Value: 361 Ohm
Lead Channel Impedance Value: 399 Ohm
Lead Channel Impedance Value: 456 Ohm
Lead Channel Pacing Threshold Amplitude: 0.375 V
Lead Channel Pacing Threshold Amplitude: 0.75 V
Lead Channel Pacing Threshold Pulse Width: 0.4 ms
Lead Channel Pacing Threshold Pulse Width: 0.4 ms
Lead Channel Sensing Intrinsic Amplitude: 3.875 mV
Lead Channel Sensing Intrinsic Amplitude: 3.875 mV
Lead Channel Sensing Intrinsic Amplitude: 6.125 mV
Lead Channel Sensing Intrinsic Amplitude: 6.125 mV
Lead Channel Setting Pacing Amplitude: 2.5 V
Lead Channel Setting Pacing Pulse Width: 0.4 ms
MDC IDC LEAD IMPLANT DT: 20180620
MDC IDC SET LEADCHNL RA PACING AMPLITUDE: 1.5 V
MDC IDC SET LEADCHNL RV SENSING SENSITIVITY: 2 mV

## 2018-10-23 ENCOUNTER — Encounter: Payer: Self-pay | Admitting: Cardiology

## 2018-10-23 NOTE — Progress Notes (Signed)
Remote pacemaker transmission.   

## 2018-11-27 ENCOUNTER — Telehealth: Payer: Self-pay | Admitting: Internal Medicine

## 2018-11-27 NOTE — Telephone Encounter (Signed)
Spoke with patient. Normal device function.

## 2018-11-27 NOTE — Telephone Encounter (Signed)
Patient called stating he has a pacemaker this morning he got an indication from the machine to do a reading, he would like to know what the reading said. Wants to make sure everything is ok.

## 2018-11-30 ENCOUNTER — Telehealth: Payer: Self-pay | Admitting: Internal Medicine

## 2018-11-30 NOTE — Telephone Encounter (Signed)
New message   Spoke w/ pt and scheduled phone visit with Dr. Lovena Le, pt phone number is listed in appt notes.     Virtual Visit Pre-Appointment Phone Call  "(Name), I am calling you today to discuss your upcoming appointment. We are currently trying to limit exposure to the virus that causes COVID-19 by seeing patients at home rather than in the office."  1. "What is the BEST phone number to call the day of the visit?" - include this in appointment notes  2. Do you have or have access to (through a family member/friend) a smartphone with video capability that we can use for your visit?" a. If yes - list this number in appt notes as cell (if different from BEST phone #) and list the appointment type as a VIDEO visit in appointment notes b. If no - list the appointment type as a PHONE visit in appointment notes  3. Confirm consent - "In the setting of the current Covid19 crisis, you are scheduled for a (phone or video) visit with your provider on (date) at (time).  Just as we do with many in-office visits, in order for you to participate in this visit, we must obtain consent.  If you'd like, I can send this to your mychart (if signed up) or email for you to review.  Otherwise, I can obtain your verbal consent now.  All virtual visits are billed to your insurance company just like a normal visit would be.  By agreeing to a virtual visit, we'd like you to understand that the technology does not allow for your provider to perform an examination, and thus may limit your provider's ability to fully assess your condition. If your provider identifies any concerns that need to be evaluated in person, we will make arrangements to do so.  Finally, though the technology is pretty good, we cannot assure that it will always work on either your or our end, and in the setting of a video visit, we may have to convert it to a phone-only visit.  In either situation, we cannot ensure that we have a secure  connection.  Are you willing to proceed?" STAFF: Did the patient verbally acknowledge consent to telehealth visit? Document YES/NO here: YES  4. Advise patient to be prepared - "Two hours prior to your appointment, go ahead and check your blood pressure, pulse, oxygen saturation, and your weight (if you have the equipment to check those) and write them all down. When your visit starts, your provider will ask you for this information. If you have an Apple Watch or Kardia device, please plan to have heart rate information ready on the day of your appointment. Please have a pen and paper handy nearby the day of the visit as well."  5. Give patient instructions for MyChart download to smartphone OR Doximity/Doxy.me as below if video visit (depending on what platform provider is using)  6. Inform patient they will receive a phone call 15 minutes prior to their appointment time (may be from unknown caller ID) so they should be prepared to answer    TELEPHONE CALL NOTE  [name] has been deemed a candidate for a follow-up tele-health visit to limit community exposure during the Covid-19 pandemic. I spoke with the patient via phone to ensure availability of phone/video source, confirm preferred email & phone number, and discuss instructions and expectations.  I reminded [name] to be prepared with any vital sign and/or heart rhythm information that could  potentially be obtained via home monitoring, at the time of his visit. I reminded [name] to expect a phone call prior to his visit.  [name] 11/30/2018 2:36 PM   INSTRUCTIONS FOR DOWNLOADING THE MYCHART APP TO SMARTPHONE  - The patient must first make sure to have activated MyChart and know their login information - If Apple, go to [employer] and type in MyChart in the search bar and download the app. If Android, ask patient to go to [employer] and type in [place] in the search bar and download the app. The app  is free but as with any other app downloads, their phone may require them to verify saved payment information or Apple/Android password.  - The patient will need to then log into the app with their MyChart username and password, and select [facility] as their healthcare provider to link the account. When it is time for your visit, go to the MyChart app, find appointments, and click Begin Video Visit. Be sure to Select Allow for your device to access the Microphone and Camera for your visit. You will then be connected, and your provider will be with you shortly.  **If they have any issues connecting, or need assistance please contact MyChart service desk [phone])**  **If using a computer, in order to ensure the best quality for their visit they will need to use either of the following Internet Browsers: [employer], or Google Chrome**  IF USING DOXIMITY or DOXY.ME - The patient will receive a link just prior to their visit by text.     FULL LENGTH CONSENT FOR TELE-HEALTH VISIT   I hereby voluntarily request, consent and authorize [facility] and its employed or contracted physicians, physician assistants, nurse practitioners or other licensed health care professionals (the Practitioner), to provide me with telemedicine health care services (the Services") as deemed necessary by the treating Practitioner. I acknowledge and consent to receive the Services by the Practitioner via telemedicine. I understand that the telemedicine visit will involve communicating with the Practitioner through live audiovisual communication technology and the disclosure of certain medical information by electronic transmission. I acknowledge that I have been given the opportunity to request an in-person assessment or other available alternative prior to the telemedicine visit and am voluntarily participating in the telemedicine visit.  I understand that I have the right to withhold or withdraw my  consent to the use of telemedicine in the course of my care at any time, without affecting my right to future care or treatment, and that the Practitioner or I may terminate the telemedicine visit at any time. I understand that I have the right to inspect all information obtained and/or recorded in the course of the telemedicine visit and may receive copies of available information for a reasonable fee.  I understand that some of the potential risks of receiving the Services via telemedicine include:   Delay or interruption in medical evaluation due to technological equipment failure or disruption;  Information transmitted may not be sufficient (e.g. poor resolution of images) to allow for appropriate medical decision making by the Practitioner; and/or   In rare instances, security protocols could fail, causing a breach of personal health information.  Furthermore, I acknowledge that it is my responsibility to provide information about my medical history, conditions and care that is complete and accurate to the best of my ability. I acknowledge that Practitioner's advice, recommendations, and/or decision may be based on factors not within their  control, such as incomplete or inaccurate data provided by me or distortions of diagnostic images or specimens that may result from electronic transmissions. I understand that the practice of medicine is not an exact science and that Practitioner makes no warranties or guarantees regarding treatment outcomes. I acknowledge that I will receive a copy of this consent concurrently upon execution via email to the email address I last provided but may also request a printed copy by calling the office of [facility].    I understand that my insurance will be billed for this visit.   I have read or had this consent read to me.  I understand the contents of this consent, which adequately explains the benefits and risks of the Services being provided via telemedicine.    I have been provided ample opportunity to ask questions regarding this consent and the Services and have had my questions answered to my satisfaction.  I give my informed consent for the services to be provided through the use of telemedicine in my medical care  By participating in this telemedicine visit I agree to the above.

## 2018-12-06 ENCOUNTER — Other Ambulatory Visit: Payer: Self-pay

## 2018-12-06 ENCOUNTER — Telehealth (INDEPENDENT_AMBULATORY_CARE_PROVIDER_SITE_OTHER): Payer: Medicare Other | Admitting: Internal Medicine

## 2018-12-06 DIAGNOSIS — I442 Atrioventricular block, complete: Secondary | ICD-10-CM | POA: Diagnosis not present

## 2018-12-06 DIAGNOSIS — Z950 Presence of cardiac pacemaker: Secondary | ICD-10-CM

## 2018-12-06 NOTE — Progress Notes (Signed)
Electrophysiology TeleHealth Note   Due to national recommendations of social distancing due to COVID 19, an audio/video telehealth visit is felt to be most appropriate for this patient at this time.  See MyChart message from today for the patient's consent to telehealth for [facility].   Date:  12/06/2018   ID:  [name], DOB 1932/03/08, MRN [id number]  Location: patient's home  Provider location: [address] [place]  Evaluation Performed: Follow-up visit  PCP:  Denita Lung, MD  Cardiologist:  No primary care provider on file. Electrophysiologist:  Dr Lovena Le  Chief Complaint:  "I am still constipated since you put that pacemaker in"  History of Present Illness:    [name] is a 85 y.o. male who presents via audio/video conferencing for a telehealth visit today. He is a pleasant 85 yo man with a h/o CHB, s/p PPM insertion, HTN, and remote SVT. He has been bothered by constipation. Since last being seen in our clinic, the patient reports doing very well.  Today, he denies symptoms of palpitations, chest pain, shortness of breath,  lower extremity edema, dizziness, presyncope, or syncope.  The patient is otherwise without complaint today.  The patient denies symptoms of fevers, chills, cough, or new SOB worrisome for COVID 19.  Past Medical History:  Diagnosis Date   1st degree AV block    Arthritis    "hands, back, neck, shoulders, knees" (01/11/2017)   Eczema    GERD (gastroesophageal reflux disease)    HH (hiatus hernia)    History of blood transfusion    "maybe; w/prostate OR; they would have used my own" (01/11/2017)   History of colonic polyps    "I don't think I've had any polyps" (01/11/2017)   Hypercholesterolemia    Hypertension    Presence of permanent cardiac pacemaker    Prostate cancer ([facility]) 1997   Seasonal allergies    SVT (supraventricular tachycardia) ([facility])     Past Surgical History:  Procedure  Laterality Date   AV NODE ABLATION  2000s X 2   AVNODE REENTRANT ABLATION   CATARACT EXTRACTION W/ INTRAOCULAR LENS  IMPLANT, BILATERAL     COLONOSCOPY  2007   MAGOD   INSERT / REPLACE / REMOVE PACEMAKER  01/11/2017   PACEMAKER IMPLANT N/A 01/11/2017   Procedure: Pacemaker Implant;  Surgeon: Evans Lance, MD;  Location: [facility];  Service: Cardiovascular;  Laterality: N/A;   PROSTATECTOMY     TESTICLE SURGERY Left 2010   "benign growth"; [facility]   TONSILLECTOMY      Current Outpatient Medications  Medication Sig Dispense Refill   acetaminophen (TYLENOL) 650 MG CR tablet Take 650 mg by mouth daily as needed for pain.     amLODipine (NORVASC) 5 MG tablet Take 1 tablet (5 mg total) by mouth daily. 90 tablet 3   aspirin 81 MG tablet Take 162 mg by mouth daily.      cetirizine (ZYRTEC) 10 MG tablet Take 10 mg by mouth at bedtime.      desonide (DESOWEN) 0.05 % cream APPLY TWO TIMES DAILY 15 g 3   fluticasone (FLONASE) 50 MCG/ACT nasal spray Place 2 sprays into both nostrils daily. 48 g 3   hydrochlorothiazide (HYDRODIURIL) 12.5 MG tablet Take 1 tablet (12.5 mg total) by mouth daily. 90 tablet 3   ibuprofen (ADVIL,MOTRIN) 200 MG tablet Take 400 mg by mouth daily as needed (pain).     omeprazole (PRILOSEC) 20 MG  capsule 1 daily 180 capsule 3   pravastatin (PRAVACHOL) 40 MG tablet Take 1 tablet (40 mg total) by mouth daily. 90 tablet 3   No current facility-administered medications for this visit.     Allergies:   Codeine   Social History:  The patient  reports that he quit smoking about 56 years ago. His smoking use included cigarettes. He has a 19.50 pack-year smoking history. He has never used smokeless tobacco. He reports that he does not drink alcohol or use drugs.   Family History:  The patient's  family history includes Alcoholism in his father; Cancer in his mother.   ROS:  Please see the history of present illness.   All other systems are  personally reviewed and negative.    Exam:    Vital Signs:  There were no vitals taken for this visit.  Well appearing, alert and conversant, regular work of breathing,  good skin color Eyes- anicteric, neuro- grossly intact, skin- no apparent rash or lesions or cyanosis, mouth- oral mucosa is pink   Labs/Other Tests and Data Reviewed:    Recent Labs: 04/23/2018: ALT 32; BUN 18; Creatinine, Ser 1.04; Hemoglobin 16.3; Platelets 204; Potassium 4.4; Sodium 139   Wt Readings from Last 3 Encounters:  08/08/18 157 lb 9.6 oz (71.5 kg)  06/15/18 155 lb 8 oz (70.5 kg)  04/23/18 154 lb 12.8 oz (70.2 kg)     Other studies personally reviewed: Additional studies/ records that were reviewed today include:    Last device remote is reviewed from [facility] PDF dated 3/27 which reveals normal device function, no arrhythmias    ASSESSMENT & PLAN:    1.  CHB - he is asymptomatic, s/p PPM insertion. 2. PPM - his medtronic DDD PM is working normally and has over 9 years of battery longevity. 3. Constipation - he is managing with miralax.   4. COVID 19 screen The patient denies symptoms of COVID 19 at this time.  The importance of social distancing was discussed today.  Follow-up:  12 months Next remote: 6/20  Current medicines are reviewed at length with the patient today.   The patient does not have concerns regarding his medicines.  The following changes were made today:  none  Labs/ tests ordered today include: none No orders of the defined types were placed in this encounter.    Patient Risk:  after full review of this patients clinical status, I feel that they are at moderate risk at this time.  Today, I have spent 15 minutes with the patient with telehealth technology discussing all of the above.    Signed, [name], MD  12/06/2018 9:35 AM     [place] [address] [phone] (office) [phone] (fax)

## 2018-12-10 ENCOUNTER — Other Ambulatory Visit: Payer: Self-pay

## 2018-12-10 ENCOUNTER — Encounter: Payer: Self-pay | Admitting: Family Medicine

## 2018-12-10 ENCOUNTER — Ambulatory Visit (INDEPENDENT_AMBULATORY_CARE_PROVIDER_SITE_OTHER): Payer: Medicare Other | Admitting: Family Medicine

## 2018-12-10 DIAGNOSIS — J309 Allergic rhinitis, unspecified: Secondary | ICD-10-CM

## 2018-12-10 NOTE — Progress Notes (Signed)
   Subjective:    Patient ID: [name], male    DOB: Sep 15, 1931, 85 y.o.   MRN: [id number]  HPI Documentation for virtual telephone encounter. Documentation for virtual audio and video telecommunications through WebEx encounter: The patient was located at home. The provider was located in the office. The patient did consent to this visit and is aware of possible charges through their insurance for this visit.  The other persons participating in this telemedicine service were none. Time spent on call was 10 minutes and in review of previous records >20 minutes total. This virtual service is not related to other E/M service within previous 7 days. He has had difficulty with nasal drainage that intermittently is purulent with some PND.  This is been going on for several months.  No fever, chills, sore throat, headache, sneezing, itchy watery eyes.  He has been using Flonase and no antihistamine.   Review of Systems     Objective:   Physical Exam Alert and in no distress.  Voice is normal.       Assessment & Plan:  Allergic rhinitis, unspecified seasonality, unspecified trigger I explained that I did not think this was truly an infection and did recommend switching to Rhinocort and call me in 3 weeks to let me know how he is doing.  Discussed the possibility of using an antibiotic however he is comfortable with not using that and I agree.

## 2018-12-27 ENCOUNTER — Encounter: Payer: Medicare HMO | Admitting: Internal Medicine

## 2018-12-31 ENCOUNTER — Telehealth: Payer: Self-pay | Admitting: Family Medicine

## 2018-12-31 MED ORDER — TRIAMCINOLONE ACETONIDE 0.1 % EX CREA: TOPICAL_CREAM | Freq: Two times a day (BID) | CUTANEOUS | 1 refills | Status: DC

## 2018-12-31 NOTE — Telephone Encounter (Signed)
Recv'd fax that Desonide Cream not covered, covered alternatives are Hydrocortisone & Mometasone, can pt be switched?

## 2018-12-31 NOTE — Telephone Encounter (Signed)
Please advise if cream desonide. Please advise [facility]

## 2018-12-31 NOTE — Telephone Encounter (Signed)
Pharmacy sent refill request for desonide 0.05 % cream apply two times daily with 3 refills please [place], [place]

## 2019-01-01 NOTE — Telephone Encounter (Signed)
Pt called and stated he did not want to try the Triamcinolone, said he has been on the Desonide for 50 years, and has tried multiple creams and nothing works as good or is too strong. His rosacea is on his face and can't use the stronger cortisone creams.  The cost is $240 without insurance and if approved with P.A. will be $130. Completed P.A. for Desonide

## 2019-01-02 NOTE — Telephone Encounter (Signed)
Called [facility] [phone] and P.A. is under review and they have all the info they need

## 2019-01-05 NOTE — Telephone Encounter (Signed)
P.A. denied, not covered for Rosacea

## 2019-01-08 ENCOUNTER — Telehealth: Payer: Self-pay | Admitting: Family Medicine

## 2019-01-08 NOTE — Telephone Encounter (Signed)
Pt called and is requesting a phone call with you regarding the medicine that has been denied for his rosacea he just wants to talk to you about it pt can be reached at [phone]

## 2019-01-10 ENCOUNTER — Other Ambulatory Visit: Payer: Self-pay

## 2019-01-10 ENCOUNTER — Encounter: Payer: Self-pay | Admitting: Orthopaedic Surgery

## 2019-01-10 ENCOUNTER — Telehealth: Payer: Self-pay | Admitting: *Deleted

## 2019-01-10 ENCOUNTER — Ambulatory Visit (INDEPENDENT_AMBULATORY_CARE_PROVIDER_SITE_OTHER): Payer: Medicare Other | Admitting: Orthopaedic Surgery

## 2019-01-10 DIAGNOSIS — M1711 Unilateral primary osteoarthritis, right knee: Secondary | ICD-10-CM | POA: Diagnosis not present

## 2019-01-10 DIAGNOSIS — M1712 Unilateral primary osteoarthritis, left knee: Secondary | ICD-10-CM

## 2019-01-10 MED ORDER — LIDOCAINE HCL 1 % IJ SOLN
2.0000 mL | INTRAMUSCULAR | Status: AC | PRN
  Administered 2019-01-10: 2 mL

## 2019-01-10 MED ORDER — METHYLPREDNISOLONE ACETATE 40 MG/ML IJ SUSP
40.0000 mg | INTRAMUSCULAR | Status: AC | PRN
  Administered 2019-01-10: 40 mg via INTRA_ARTICULAR

## 2019-01-10 MED ORDER — BUPIVACAINE HCL 0.5 % IJ SOLN
2.0000 mL | INTRAMUSCULAR | Status: AC | PRN
  Administered 2019-01-10: 2 mL via INTRA_ARTICULAR

## 2019-01-10 NOTE — Telephone Encounter (Signed)
ok 

## 2019-01-10 NOTE — Telephone Encounter (Signed)
Pt is scheduled for 02/04/2019 with driver and no BTs.

## 2019-01-10 NOTE — Progress Notes (Signed)
Office Visit Note   Patient: [name]           Date of Birth: [birth year]           MRN: [id number] Visit Date: 01/10/2019              Requested by: Denita Lung, MD [place],  [place] [id number] PCP: Denita Lung, MD   Assessment & Plan: Visit Diagnoses:  1. Primary osteoarthritis of left knee   2. Primary osteoarthritis of right knee     Plan: Impression is bilateral knee osteoarthritis with recent exacerbation.  Bilateral knee injections were performed today.  Patient tolerated this well.  We will see him back as needed.  Questions encouraged and answered.  Follow-Up Instructions: No follow-ups on file.   Orders:  No orders of the defined types were placed in this encounter.  No orders of the defined types were placed in this encounter.     Procedures: Large Joint Inj: bilateral knee on 01/10/2019 9:30 AM Indications: pain Details: 22 G needle  Arthrogram: No  Medications (Right): 2 mL lidocaine 1 %; 2 mL bupivacaine 0.5 %; 40 mg methylPREDNISolone acetate 40 MG/ML Medications (Left): 2 mL lidocaine 1 %; 2 mL bupivacaine 0.5 %; 40 mg methylPREDNISolone acetate 40 MG/ML Outcome: tolerated well, no immediate complications Patient was prepped and draped in the usual sterile fashion.       Clinical Data: No additional findings.   Subjective: Chief Complaint  Patient presents with  . Left Knee - Pain  . Right Knee - Pain    [name] comes in today for evaluation of bilateral knee osteoarthritis.  He has had previous cortisone injections with good relief.  He would like to have one in each of his knees today.  He really did not get much relief from Visco supplementation.  Recently his left knee has been hurting quite a bit.   Review of Systems   Objective: Vital Signs: There were no vitals taken for this visit.  Physical Exam  Ortho Exam Bilateral knee exams are unchanged.  No joint effusion. Specialty Comments:  No  specialty comments available.  Imaging: No results found.   PMFS History: Patient Active Problem List   Diagnosis Date Noted  . Unilateral primary osteoarthritis, right knee 07/12/2018  . Acute pain of left knee 07/12/2018  . Pacemaker 04/21/2017  . Complete heart block ([place]) 01/11/2017  . Acute pain of left shoulder 08/02/2016  . History of prostate cancer 12/27/2010  . Arthritis 12/27/2010  . Hx of cataract surgery 12/27/2010  . HH (hiatus hernia)   . History of colonic polyps   . Hyperlipidemia LDL goal <130 04/21/2008  . Allergic rhinitis 04/21/2008  . Essential hypertension 04/18/2008   Past Medical History:  Diagnosis Date  . 1st degree AV block   . Arthritis    "hands, back, neck, shoulders, knees" (01/11/2017)  . Eczema   . GERD (gastroesophageal reflux disease)   . HH (hiatus hernia)   . History of blood transfusion    "maybe; w/prostate OR; they would have used my own" (01/11/2017)  . History of colonic polyps    "I don't think I've had any polyps" (01/11/2017)  . Hypercholesterolemia   . Hypertension   . Presence of permanent cardiac pacemaker   . Prostate cancer ([facility]) 1997  . Seasonal allergies   . SVT (supraventricular tachycardia) ([facility])     Family History  Problem Relation Age of Onset  . Cancer  Mother   . Alcoholism Father     Past Surgical History:  Procedure Laterality Date  . AV NODE ABLATION  2000s X 2   AVNODE REENTRANT ABLATION  . CATARACT EXTRACTION W/ INTRAOCULAR LENS  IMPLANT, BILATERAL    . COLONOSCOPY  2007   [facility]  . INSERT / REPLACE / REMOVE PACEMAKER  01/11/2017  . PACEMAKER IMPLANT N/A 01/11/2017   Procedure: Pacemaker Implant;  Surgeon: Evans Lance, MD;  Location: [facility];  Service: Cardiovascular;  Laterality: N/A;  . PROSTATECTOMY    . TESTICLE SURGERY Left 2010   "benign growth"; [facility]  . TONSILLECTOMY     Social History   Occupational History  . Not on file  Tobacco Use  . Smoking status: Former  Smoker    Packs/day: 1.50    Years: 13.00    Pack years: 19.50    Types: Cigarettes    Quit date: 08/25/1962    Years since quitting: 56.4  . Smokeless tobacco: Never Used  Substance and Sexual Activity  . Alcohol use: No  . Drug use: No  . Sexual activity: Never

## 2019-01-11 NOTE — Telephone Encounter (Signed)
Only other option is an appeal which would be a letter stating as to why this specific medication is better for his Rosacea than the preferred Hydrocortisone & Mometasone.

## 2019-01-13 NOTE — Telephone Encounter (Signed)
I called in Kenal[place] for him and said that the other med is not covered. I think that we are OK

## 2019-01-17 ENCOUNTER — Ambulatory Visit (INDEPENDENT_AMBULATORY_CARE_PROVIDER_SITE_OTHER): Payer: Medicare Other | Admitting: *Deleted

## 2019-01-17 DIAGNOSIS — I442 Atrioventricular block, complete: Secondary | ICD-10-CM | POA: Diagnosis not present

## 2019-01-18 ENCOUNTER — Telehealth: Payer: Self-pay

## 2019-01-18 LAB — CUP PACEART REMOTE DEVICE CHECK
Battery Remaining Longevity: 108 mo
Battery Voltage: 3 V
Brady Statistic AP VP Percent: 60.55 %
Brady Statistic AP VS Percent: 0.08 %
Brady Statistic AS VP Percent: 36.31 %
Brady Statistic AS VS Percent: 3.06 %
Brady Statistic RA Percent Paced: 60.53 %
Brady Statistic RV Percent Paced: 96.86 %
Date Time Interrogation Session: 20200626130705
Implantable Lead Implant Date: 20180620
Implantable Lead Implant Date: 20180620
Implantable Lead Location: 753859
Implantable Lead Location: 753860
Implantable Lead Model: 5076
Implantable Lead Model: 5076
Implantable Pulse Generator Implant Date: 20180620
Lead Channel Impedance Value: 323 Ohm
Lead Channel Impedance Value: 380 Ohm
Lead Channel Impedance Value: 399 Ohm
Lead Channel Impedance Value: 437 Ohm
Lead Channel Pacing Threshold Amplitude: 0.5 V
Lead Channel Pacing Threshold Amplitude: 0.75 V
Lead Channel Pacing Threshold Pulse Width: 0.4 ms
Lead Channel Pacing Threshold Pulse Width: 0.4 ms
Lead Channel Sensing Intrinsic Amplitude: 3.125 mV
Lead Channel Sensing Intrinsic Amplitude: 3.125 mV
Lead Channel Sensing Intrinsic Amplitude: 5.625 mV
Lead Channel Sensing Intrinsic Amplitude: 5.625 mV
Lead Channel Setting Pacing Amplitude: 1.5 V
Lead Channel Setting Pacing Amplitude: 2.5 V
Lead Channel Setting Pacing Pulse Width: 0.4 ms
Lead Channel Setting Sensing Sensitivity: 2 mV

## 2019-01-18 NOTE — Telephone Encounter (Signed)
Spoke with patient to remind of missed remote transmission 

## 2019-01-24 ENCOUNTER — Encounter: Payer: Self-pay | Admitting: Cardiology

## 2019-01-24 NOTE — Progress Notes (Signed)
Remote pacemaker transmission.   

## 2019-02-01 ENCOUNTER — Other Ambulatory Visit: Payer: Self-pay

## 2019-02-01 ENCOUNTER — Encounter: Payer: Self-pay | Admitting: Family Medicine

## 2019-02-01 ENCOUNTER — Ambulatory Visit (INDEPENDENT_AMBULATORY_CARE_PROVIDER_SITE_OTHER): Payer: Medicare Other | Admitting: Family Medicine

## 2019-02-01 VITALS — Wt 149.0 lb

## 2019-02-01 DIAGNOSIS — L989 Disorder of the skin and subcutaneous tissue, unspecified: Secondary | ICD-10-CM | POA: Diagnosis not present

## 2019-02-01 NOTE — Progress Notes (Signed)
   Subjective:    Patient ID: [name], male    DOB: [birth year], 85 y.o.   MRN: [id number]  HPI Documentation for virtual telephone encounter.  Documentation for virtual audio and video telecommunications through [facility] encounter: The patient was located at home. The provider was located in the office. The patient did consent to this visit and is aware of possible charges through their insurance for this visit. The other persons participating in this telemedicine service were none. Time spent on call was 5 minutes and in review of previous records >15 minutes total. This virtual service is not related to other E/M service within previous 7 days. He has a lesion present on his right upper arm that itches is crusty and has sections of it that are black.   Review of Systems     Objective:   Physical Exam Virtual exam was difficult to do any therefore sent a picture.  The lesion does appear raised with some surrounding erythema.  Difficult to assess what it is.       Assessment & Plan:  Skin lesion - Plan: Ambulatory referral to Dermatology,

## 2019-02-04 ENCOUNTER — Ambulatory Visit (INDEPENDENT_AMBULATORY_CARE_PROVIDER_SITE_OTHER): Payer: Medicare Other | Admitting: Physical Medicine and Rehabilitation

## 2019-02-04 ENCOUNTER — Other Ambulatory Visit: Payer: Self-pay

## 2019-02-04 ENCOUNTER — Ambulatory Visit (INDEPENDENT_AMBULATORY_CARE_PROVIDER_SITE_OTHER): Payer: Medicare Other | Admitting: Family Medicine

## 2019-02-04 ENCOUNTER — Ambulatory Visit: Payer: Self-pay

## 2019-02-04 ENCOUNTER — Other Ambulatory Visit: Payer: Self-pay | Admitting: Family Medicine

## 2019-02-04 VITALS — BP 138/86 | HR 86 | Temp 98.4°F | Wt 151.0 lb

## 2019-02-04 VITALS — BP 159/81 | HR 67

## 2019-02-04 DIAGNOSIS — B079 Viral wart, unspecified: Secondary | ICD-10-CM | POA: Diagnosis not present

## 2019-02-04 DIAGNOSIS — L989 Disorder of the skin and subcutaneous tissue, unspecified: Secondary | ICD-10-CM | POA: Diagnosis not present

## 2019-02-04 DIAGNOSIS — M5416 Radiculopathy, lumbar region: Secondary | ICD-10-CM

## 2019-02-04 HISTORY — PX: SKIN BIOPSY: SHX1

## 2019-02-04 MED ORDER — LIDOCAINE-EPINEPHRINE 2 %-1:100000 IJ SOLN
1.7000 mL | Freq: Once | INTRAMUSCULAR | Status: AC
  Administered 2019-02-04: 1.7 mL via INTRADERMAL

## 2019-02-04 MED ORDER — METHYLPREDNISOLONE ACETATE 80 MG/ML IJ SUSP: 80.0000 mg | Freq: Once | INTRAMUSCULAR | Status: DC

## 2019-02-04 NOTE — Progress Notes (Signed)
   Subjective:    Patient ID: [name], male    DOB: 12/04/1931, 85 y.o.   MRN: [id number]  HPI He was seen last week virtually because of the lesion on his upper arm.  He was to see dermatology however he came here to have it excised.   Review of Systems     Objective:   Physical Exam Alert and in no distress.  1 cm raised blackish lesion noted on the right forearm.  There is some surrounding slight erythema.       Assessment & Plan:   Encounter Diagnosis  Name Primary?  . Skin lesion Yes  I explained that it looked as if it was in the process of necrosing and falling off however he states that he would like it removed.  The skin was injected with Xylocaine and epinephrine.  The lesion was excised and the base hyfrecated.  It was sent off for pathology.

## 2019-02-04 NOTE — Progress Notes (Signed)
Patient had come in today for planned L4-5 interlaminar epidural steroid injection for moderate stenosis at that level which had helped him in the past and this does seem like it is the right choice and far as direction to inject goes.  Unfortunately did not have a driver today so we did reschedule him for later in the week.   Numeric Pain Rating Scale and Functional Assessment Average Pain 5   In the last MONTH (on 0-10 scale) has pain interfered with the following?  1. General activity like being  able to carry out your everyday physical activities such as walking, climbing stairs, carrying groceries, or moving a chair?  Rating(5)  Lower back pain, across mid back. Weakness in back. No radiating pain or numbness in legs. Previous injections helpful.    - Driver, -BT, -Dye Allergies.

## 2019-02-14 ENCOUNTER — Encounter: Payer: Self-pay | Admitting: Physical Medicine and Rehabilitation

## 2019-02-14 ENCOUNTER — Ambulatory Visit: Payer: Medicare Other

## 2019-02-14 ENCOUNTER — Ambulatory Visit (INDEPENDENT_AMBULATORY_CARE_PROVIDER_SITE_OTHER): Payer: Medicare Other | Admitting: Physical Medicine and Rehabilitation

## 2019-02-14 VITALS — BP 159/77 | HR 60

## 2019-02-14 DIAGNOSIS — M545 Low back pain: Secondary | ICD-10-CM | POA: Diagnosis not present

## 2019-02-14 DIAGNOSIS — G8929 Other chronic pain: Secondary | ICD-10-CM | POA: Diagnosis not present

## 2019-02-14 DIAGNOSIS — M5416 Radiculopathy, lumbar region: Secondary | ICD-10-CM | POA: Diagnosis not present

## 2019-02-14 NOTE — Progress Notes (Signed)
  Numeric Pain Rating Scale and Functional Assessment Average Pain 5   In the last MONTH (on 0-10 scale) has pain interfered with the following?  1. General activity like being  able to carry out your everyday physical activities such as walking, climbing stairs, carrying groceries, or moving a chair?  Rating(7)   +Driver, -BT, -Dye Allergies. 

## 2019-02-15 DIAGNOSIS — M5416 Radiculopathy, lumbar region: Secondary | ICD-10-CM | POA: Diagnosis not present

## 2019-02-15 DIAGNOSIS — G8929 Other chronic pain: Secondary | ICD-10-CM

## 2019-02-15 DIAGNOSIS — M545 Low back pain: Secondary | ICD-10-CM

## 2019-02-15 MED ORDER — METHYLPREDNISOLONE ACETATE 80 MG/ML IJ SUSP
80.0000 mg | Freq: Once | INTRAMUSCULAR | Status: AC
  Administered 2019-02-15: 06:00:00 80 mg

## 2019-02-15 NOTE — Progress Notes (Signed)
[name] - 85 y.o. male MRN [id number]  Date of birth: 1932/03/12  Office Visit Note: Visit Date: 02/14/2019 PCP: Denita Lung, MD Referred by: Denita Lung, MD  Subjective: Chief Complaint  Patient presents with  . Lower Back - Pain   HPI:  [name] is a 85 y.o. male who comes in today For planned repeat of right L4-5 interlaminar epidural steroid injection.  Patient has history of just fairly degenerative lower back with facet arthropathy without high-grade central stenosis.  He gets a feeling of pain and weakness in the back that creeps up on him and he is done well in the past with epidural injection as it seems to help him enough to stay active.  He does play golf.  ROS Otherwise per HPI.  Assessment & Plan: Visit Diagnoses:  1. Lumbar radiculopathy   2. Chronic bilateral low back pain without sciatica     Plan: No additional findings.   Meds & Orders:  Meds ordered this encounter  Medications  . methylPREDNISolone acetate (DEPO-MEDROL) injection 80 mg    Orders Placed This Encounter  Procedures  . XR C-ARM NO REPORT  . Epidural Steroid injection    Follow-up: Return if symptoms worsen or fail to improve.   Procedures: No procedures performed  Lumbar Epidural Steroid Injection - Interlaminar Approach with Fluoroscopic Guidance  Patient: JARRETT ALBOR      Date of Birth: 11-04-1931 MRN: [id number] PCP: Denita Lung, MD      Visit Date: 02/14/2019   Universal Protocol:     Consent Given By: the patient  Position: PRONE  Additional Comments: Vital signs were monitored before and after the procedure. Patient was prepped and draped in the usual sterile fashion. The correct patient, procedure, and site was verified.   Injection Procedure Details:  Procedure Site One Meds Administered:  Meds ordered this encounter  Medications  . methylPREDNISolone acetate (DEPO-MEDROL) injection 80 mg     Laterality: Right  Location/Site:   L4-L5  Needle size: 20 G  Needle type: Tuohy  Needle Placement: Paramedian epidural  Findings:   -Comments: Excellent flow of contrast into the epidural space.  Procedure Details: Using a paramedian approach from the side mentioned above, the region overlying the inferior lamina was localized under fluoroscopic visualization and the soft tissues overlying this structure were infiltrated with 4 ml. of 1% Lidocaine without Epinephrine. The Tuohy needle was inserted into the epidural space using a paramedian approach.   The epidural space was localized using loss of resistance along with lateral and bi-planar fluoroscopic views.  After negative aspirate for air, blood, and CSF, a 2 ml. volume of Isovue-250 was injected into the epidural space and the flow of contrast was observed. Radiographs were obtained for documentation purposes.    The injectate was administered into the level noted above.   Additional Comments:  The patient tolerated the procedure well Dressing: 2 x 2 sterile gauze and Band-Aid    Post-procedure details: Patient was observed during the procedure. Post-procedure instructions were reviewed.  Patient left the clinic in stable condition.    Clinical History: CT LUMBAR SPINE WITHOUT CONTRAST  TECHNIQUE: Multidetector CT imaging of the lumbar spine was performed without intravenous contrast administration. Multiplanar CT image reconstructions were also generated.  COMPARISON:  Lumbar spine radiographs 02/01/2018  FINDINGS: Segmentation: Normal  Alignment: Mild retrolisthesis L1-2, L2-3, L3-4. Mild anterolisthesis L4-5 approximately 4 mm. 5 mm retrolisthesis L5-S1. Mild lumbar scoliosis.  Vertebrae: Negative for  fracture or mass  Paraspinal and other soft tissues: Heavily calcified aorta and iliac arteries without aneurysm. No retroperitoneal adenopathy or mass  Disc levels: T11-12: Bilateral facet degeneration. Negative for stenosis   L1-2: Advanced disc degeneration with disc space narrowing and mild spurring. Mild facet degeneration. Moderate right foraminal stenosis due to spurring  L1-2: Advanced disc degeneration with disc space narrowing. Diffuse disc bulging. Moderate right foraminal encroachment due to vertebral endplate spurring and facet hypertrophy.  L2-3: Advanced disc degeneration with fusion of the disc space on the right. Mild foraminal stenosis bilaterally.  L3-4: Disc degeneration and spondylosis. Bilateral facet hypertrophy. Severe left foraminal encroachment and mild right foraminal encroachment due to spurring  L4-5: 4 mm anterolisthesis with advanced disc degeneration. Advanced facet hypertrophy bilaterally. Moderate central canal stenosis. Severe subarticular stenosis bilaterally with severe left foraminal encroachment.  L5-S1: 5 mm retrolisthesis with advanced disc degeneration. Diffuse endplate spurring and mild facet hypertrophy. Left-sided facet degeneration and spurring projecting into the spinal canal and likely affecting the left L5 nerve root. Moderate subarticular stenosis and foraminal stenosis bilaterally due to spurring  IMPRESSION: Advanced multilevel degenerative change throughout the lumbar spine with extensive disc space narrowing and spurring. Multilevel foraminal encroachment due to spurring as described above. Moderate central canal stenosis at L4-5.  Atherosclerotic aorta   Electronically Signed   By: Franchot Gallo M.D.   On: 02/19/2018 13:32     Objective:  VS:  HT:    WT:   BMI:     BP:(!) 159/77  HR:60bpm  TEMP: ( )  RESP:  Physical Exam  Ortho Exam Imaging: Xr C-arm No Report  Result Date: 02/14/2019 Please see Notes tab for imaging impression.

## 2019-02-15 NOTE — Procedures (Signed)
Lumbar Epidural Steroid Injection - Interlaminar Approach with Fluoroscopic Guidance  Patient: [name]      Date of Birth: 05/11/1932 MRN: [id number] PCP: Denita Lung, MD      Visit Date: 02/14/2019   Universal Protocol:     Consent Given By: the patient  Position: PRONE  Additional Comments: Vital signs were monitored before and after the procedure. Patient was prepped and draped in the usual sterile fashion. The correct patient, procedure, and site was verified.   Injection Procedure Details:  Procedure Site One Meds Administered:  Meds ordered this encounter  Medications  . methylPREDNISolone acetate (DEPO-MEDROL) injection 80 mg     Laterality: Right  Location/Site:  L4-L5  Needle size: 20 G  Needle type: Tuohy  Needle Placement: Paramedian epidural  Findings:   -Comments: Excellent flow of contrast into the epidural space.  Procedure Details: Using a paramedian approach from the side mentioned above, the region overlying the inferior lamina was localized under fluoroscopic visualization and the soft tissues overlying this structure were infiltrated with 4 ml. of 1% Lidocaine without Epinephrine. The Tuohy needle was inserted into the epidural space using a paramedian approach.   The epidural space was localized using loss of resistance along with lateral and bi-planar fluoroscopic views.  After negative aspirate for air, blood, and CSF, a 2 ml. volume of Isovue-250 was injected into the epidural space and the flow of contrast was observed. Radiographs were obtained for documentation purposes.    The injectate was administered into the level noted above.   Additional Comments:  The patient tolerated the procedure well Dressing: 2 x 2 sterile gauze and Band-Aid    Post-procedure details: Patient was observed during the procedure. Post-procedure instructions were reviewed.  Patient left the clinic in stable condition.

## 2019-02-15 NOTE — Procedures (Deleted)
Lumbar Epidural Steroid Injection - Interlaminar Approach with Fluoroscopic Guidance  Patient: [name]      Date of Birth: [birth year] MRN: [id number] PCP: Denita Lung, MD      Visit Date: 02/14/2019   Universal Protocol:     Consent Given By: the patient  Position: PRONE  Additional Comments: Vital signs were monitored before and after the procedure. Patient was prepped and draped in the usual sterile fashion. The correct patient, procedure, and site was verified.   Injection Procedure Details:  Procedure Site One Meds Administered: No orders of the defined types were placed in this encounter.    Laterality: Right  Location/Site:  L4-L5  Needle size: 20 G  Needle type: Tuohy  Needle Placement: Paramedian epidural  Findings:   -Comments: Excellent flow of contrast into the epidural space.  Procedure Details: Using a paramedian approach from the side mentioned above, the region overlying the inferior lamina was localized under fluoroscopic visualization and the soft tissues overlying this structure were infiltrated with 4 ml. of 1% Lidocaine without Epinephrine. The Tuohy needle was inserted into the epidural space using a paramedian approach.   The epidural space was localized using loss of resistance along with lateral and bi-planar fluoroscopic views.  After negative aspirate for air, blood, and CSF, a 2 ml. volume of Isovue-250 was injected into the epidural space and the flow of contrast was observed. Radiographs were obtained for documentation purposes.    The injectate was administered into the level noted above.   Additional Comments:  The patient tolerated the procedure well Dressing: 2 x 2 sterile gauze and Band-Aid    Post-procedure details: Patient was observed during the procedure. Post-procedure instructions were reviewed.  Patient left the clinic in stable condition.

## 2019-02-15 NOTE — Addendum Note (Signed)
Addended by: Raymondo Band on: 02/15/2019 06:16 AM   Modules accepted: Orders

## 2019-03-20 ENCOUNTER — Other Ambulatory Visit: Payer: Self-pay | Admitting: Family Medicine

## 2019-03-20 DIAGNOSIS — K449 Diaphragmatic hernia without obstruction or gangrene: Secondary | ICD-10-CM

## 2019-04-12 DIAGNOSIS — H40013 Open angle with borderline findings, low risk, bilateral: Secondary | ICD-10-CM | POA: Diagnosis not present

## 2019-04-15 ENCOUNTER — Encounter (INDEPENDENT_AMBULATORY_CARE_PROVIDER_SITE_OTHER): Payer: Medicare HMO | Admitting: Ophthalmology

## 2019-04-18 ENCOUNTER — Ambulatory Visit (INDEPENDENT_AMBULATORY_CARE_PROVIDER_SITE_OTHER): Payer: Medicare Other | Admitting: *Deleted

## 2019-04-18 DIAGNOSIS — I442 Atrioventricular block, complete: Secondary | ICD-10-CM

## 2019-04-18 LAB — CUP PACEART REMOTE DEVICE CHECK
Battery Remaining Longevity: 105 mo
Battery Voltage: 3 V
Brady Statistic AP VP Percent: 62.34 %
Brady Statistic AP VS Percent: 0.07 %
Brady Statistic AS VP Percent: 33.19 %
Brady Statistic AS VS Percent: 4.4 %
Brady Statistic RA Percent Paced: 62.77 %
Brady Statistic RV Percent Paced: 95.53 %
Date Time Interrogation Session: 20200924113348
Implantable Lead Implant Date: 20180620
Implantable Lead Implant Date: 20180620
Implantable Lead Location: 753859
Implantable Lead Location: 753860
Implantable Lead Model: 5076
Implantable Lead Model: 5076
Implantable Pulse Generator Implant Date: 20180620
Lead Channel Impedance Value: 323 Ohm
Lead Channel Impedance Value: 361 Ohm
Lead Channel Impedance Value: 399 Ohm
Lead Channel Impedance Value: 418 Ohm
Lead Channel Pacing Threshold Amplitude: 0.5 V
Lead Channel Pacing Threshold Amplitude: 0.75 V
Lead Channel Pacing Threshold Pulse Width: 0.4 ms
Lead Channel Pacing Threshold Pulse Width: 0.4 ms
Lead Channel Sensing Intrinsic Amplitude: 2.875 mV
Lead Channel Sensing Intrinsic Amplitude: 5.375 mV
Lead Channel Setting Pacing Amplitude: 1.5 V
Lead Channel Setting Pacing Amplitude: 2.5 V
Lead Channel Setting Pacing Pulse Width: 0.4 ms
Lead Channel Setting Sensing Sensitivity: 2 mV

## 2019-04-24 ENCOUNTER — Encounter: Payer: Self-pay | Admitting: Cardiology

## 2019-04-24 NOTE — Progress Notes (Signed)
Remote pacemaker transmission.   

## 2019-04-29 ENCOUNTER — Encounter: Payer: Self-pay | Admitting: Family Medicine

## 2019-04-29 ENCOUNTER — Ambulatory Visit (INDEPENDENT_AMBULATORY_CARE_PROVIDER_SITE_OTHER): Payer: Medicare Other | Admitting: Family Medicine

## 2019-04-29 ENCOUNTER — Other Ambulatory Visit: Payer: Self-pay

## 2019-04-29 VITALS — BP 122/70 | HR 78 | Temp 97.7°F | Ht 66.0 in | Wt 150.2 lb

## 2019-04-29 DIAGNOSIS — I10 Essential (primary) hypertension: Secondary | ICD-10-CM

## 2019-04-29 DIAGNOSIS — M1990 Unspecified osteoarthritis, unspecified site: Secondary | ICD-10-CM

## 2019-04-29 DIAGNOSIS — Z9849 Cataract extraction status, unspecified eye: Secondary | ICD-10-CM

## 2019-04-29 DIAGNOSIS — Z950 Presence of cardiac pacemaker: Secondary | ICD-10-CM

## 2019-04-29 DIAGNOSIS — J309 Allergic rhinitis, unspecified: Secondary | ICD-10-CM | POA: Diagnosis not present

## 2019-04-29 DIAGNOSIS — K219 Gastro-esophageal reflux disease without esophagitis: Secondary | ICD-10-CM | POA: Insufficient documentation

## 2019-04-29 DIAGNOSIS — R899 Unspecified abnormal finding in specimens from other organs, systems and tissues: Secondary | ICD-10-CM

## 2019-04-29 DIAGNOSIS — I442 Atrioventricular block, complete: Secondary | ICD-10-CM

## 2019-04-29 DIAGNOSIS — L989 Disorder of the skin and subcutaneous tissue, unspecified: Secondary | ICD-10-CM

## 2019-04-29 DIAGNOSIS — E785 Hyperlipidemia, unspecified: Secondary | ICD-10-CM

## 2019-04-29 DIAGNOSIS — Z8546 Personal history of malignant neoplasm of prostate: Secondary | ICD-10-CM | POA: Diagnosis not present

## 2019-04-29 NOTE — Patient Instructions (Signed)
  [name] , Thank you for taking time to come for your Medicare Wellness Visit. I appreciate your ongoing commitment to your health goals. Please review the following plan we discussed and let me know if I can assist you in the future.   These are the goals we discussed:  Continue on your present meds. Also use a triple antibiotic ointment after you have a BM This is a list of the screening recommended for you and due dates:  Health Maintenance  Topic Date Due  . Tetanus Vaccine  01/02/2022  . Pneumonia vaccines  Completed  . Flu Shot  Discontinued

## 2019-04-29 NOTE — Progress Notes (Signed)
[name] is a 85 y.o. male who presents for annual wellness visit and follow-up on chronic medical conditions.  He has continued difficulty with constipation and states that it goes back to when he had his prostate cancer surgery.  He is now using one half MiraLAX dosing with fairly good results.  He has had some difficulty with some gluteal irritation and would like this looked at.  He continues have some low back pain as well as general arthritis but seems to have this under fairly good control.  He is on fluticasone and an antihistamine for his allergies.  He does have reflux disease and is doing quite nicely on Prilosec.  He has had previous cataract surgery.  He follows up with cardiology regularly for his pacer and underlying cardiac disease.   Immunizations and Health Maintenance Immunization History  Administered Date(s) Administered  . Pneumococcal Conjugate-13 01/20/2014  . Pneumococcal Polysaccharide-23 01/03/2012  . Tdap 05/25/2000, 01/03/2012  . Zoster Recombinat (Shingrix) 10/26/2017   There are no preventive care reminders to display for this patient.  Last colonoscopy: 07-25-2005 Last PSA: unkown Dentist: Q six month 04/2019 Ophtho: 03/2019 Exercise: pull up, stretching,weights   Other doctors caring for patient include: Dr. Lovena Le cardio, Dr. Henrene Pastor GI  Catha Nottingham  Advanced Directives: ON File     Depression screen:  See questionnaire below.     Depression screen [facility] 2/9 04/29/2019 04/23/2018 02/05/2018 04/21/2017 03/14/2016  Decreased Interest 0 0 0 0 0  Down, Depressed, Hopeless 0 0 0 0 0  PHQ - 2 Score 0 0 0 0 0    Fall Screen: See Questionaire below.   Fall Risk  04/29/2019 04/23/2018 02/05/2018 04/21/2017 03/14/2016  Falls in the past year? 0 No No No No    ADL screen:  See questionnaire below.  Functional Status Survey: Is the patient deaf or have difficulty hearing?: No Does the patient have difficulty seeing, even when wearing glasses/contacts?: No Does the  patient have difficulty concentrating, remembering, or making decisions?: No Does the patient have difficulty walking or climbing stairs?: No Does the patient have difficulty dressing or bathing?: No Does the patient have difficulty doing errands alone such as visiting a doctor's office or shopping?: No   Review of Systems  Constitutional: -, -unexpected weight change, -anorexia, -fatigue Allergy: -sneezing, -itching, -congestion Dermatology: denies changing moles, rash, lumps ENT: -runny nose, -ear pain, -sore throat,  Cardiology:  -chest pain, -palpitations, -orthopnea, Respiratory: -cough, -shortness of breath, -dyspnea on exertion, -wheezing,  Gastroenterology: -abdominal pain, -nausea, -vomiting, -diarrhea, -constipation, -dysphagia Hematology: -bleeding or bruising problems Musculoskeletal: -arthralgias, -myalgias, -joint swelling, -back pain, - Ophthalmology: -vision changes,  Urology: -dysuria, -difficulty urinating,  -urinary frequency, -urgency, incontinence Neurology: -, -numbness, , -memory loss, -falls, -dizziness    PHYSICAL EXAM:  BP 122/70 (BP Location: Left Arm, Patient Position: Sitting)   Pulse 78   Temp 97.7 F (36.5 C)   Ht 5\' 6"  (1.676 m)   Wt 150 lb 3.2 oz (68.1 kg)   SpO2 97%   BMI 24.24 kg/m   General Appearance: Alert, cooperative, no distress, appears stated age Head: Normocephalic, without obvious abnormality, atraumatic Eyes: PERRL, conjunctiva/corneas clear, EOM's intact,  Ears: Normal TM's and external ear canals Nose: Nares normal, mucosa normal, no drainage or sinus   tenderness Throat: Lips, mucosa, and tongue normal; teeth and gums normal Neck: Supple, no lymphadenopathy, thyroid:no enlargement/tenderness/nodules; no carotid bruit or JVD Lungs: Clear to auscultation bilaterally without wheezes, rales or ronchi; respirations unlabored Heart:  Irregular rate and rhythm, S1 and S2 normal, no murmur, rub or gallop Extremities: No clubbing,  cyanosis or edema Pulses: 2+ and symmetric all extremities Skin: Skin color, texture, turgor normal, erythema and maceration is noted in the gluteal crease Lymph nodes: Cervical, supraclavicular, and axillary nodes normal Neurologic: CNII-XII intact, normal strength, sensation and gait; reflexes 2+ and symmetric throughout   Psych: Normal mood, affect, hygiene and grooming Anal exam does show some gluteal cleft irritation and slight maceration. ASSESSMENT/PLAN: Hyperlipidemia LDL goal <130 - Plan: Lipid panel  Essential hypertension - Plan: CBC with Differential/Platelet, Comprehensive metabolic panel  Allergic rhinitis, unspecified seasonality, unspecified trigger  History of prostate cancer  Arthritis  History of cataract extraction, unspecified laterality  Pacemaker  Complete heart block ([place]) He will continue on his present medication regimen.  Recommend good hygiene after he has a BM to clean area with soap and water and then apply triple antibiotic ointment to the gluteal area to see if this will help quiet down the erythema.      Medicare Attestation I have personally reviewed: The patient's medical and social history Their use of alcohol, tobacco or illicit drugs Their current medications and supplements The patient's functional ability including ADLs,fall risks, home safety risks, cognitive, and hearing and visual impairment Diet and physical activities Evidence for depression or mood disorders  The patient's weight, height, and BMI have been recorded in the chart.  I have made referrals, counseling, and provided education to the patient based on review of the above and I have provided the patient with a written personalized care plan for preventive services.     Jill Alexanders, MD   04/29/2019

## 2019-04-30 LAB — LIPID PANEL
Chol/HDL Ratio: 3.2 ratio (ref 0.0–5.0)
Cholesterol, Total: 203 mg/dL — ABNORMAL HIGH (ref 100–199)
HDL: 63 mg/dL (ref >39)
LDL Chol Calc (NIH): 119 mg/dL — ABNORMAL HIGH (ref 0–99)
Triglycerides: 121 mg/dL (ref 0–149)
VLDL Cholesterol Cal: 21 mg/dL (ref 5–40)

## 2019-04-30 LAB — COMPREHENSIVE METABOLIC PANEL
ALT: 29 IU/L (ref 0–44)
AST: 26 IU/L (ref 0–40)
Albumin/Globulin Ratio: 2 (ref 1.2–2.2)
Albumin: 5 g/dL — ABNORMAL HIGH (ref 3.6–4.6)
Alkaline Phosphatase: 127 IU/L — ABNORMAL HIGH (ref 39–117)
BUN/Creatinine Ratio: 14 (ref 10–24)
BUN: 17 mg/dL (ref 8–27)
Bilirubin Total: 0.6 mg/dL (ref 0.0–1.2)
CO2: 25 mmol/L (ref 20–29)
Calcium: 10.5 mg/dL — ABNORMAL HIGH (ref 8.6–10.2)
Chloride: 96 mmol/L (ref 96–106)
Creatinine, Ser: 1.24 mg/dL (ref 0.76–1.27)
GFR calc Af Amer: 61 mL/min/{1.73_m2} (ref >59)
GFR calc non Af Amer: 53 mL/min/{1.73_m2} — ABNORMAL LOW (ref >59)
Globulin, Total: 2.5 g/dL (ref 1.5–4.5)
Glucose: 95 mg/dL (ref 65–99)
Potassium: 4.2 mmol/L (ref 3.5–5.2)
Sodium: 140 mmol/L (ref 134–144)
Total Protein: 7.5 g/dL (ref 6.0–8.5)

## 2019-04-30 LAB — CBC WITH DIFFERENTIAL/PLATELET
Basophils Absolute: 0.1 10*3/uL (ref 0.0–0.2)
Basos: 1 %
EOS (ABSOLUTE): 0.1 10*3/uL (ref 0.0–0.4)
Eos: 1 %
Hematocrit: 52.3 % — ABNORMAL HIGH (ref 37.5–51.0)
Hemoglobin: 17.2 g/dL (ref 13.0–17.7)
Immature Grans (Abs): 0 10*3/uL (ref 0.0–0.1)
Immature Granulocytes: 0 %
Lymphocytes Absolute: 1 10*3/uL (ref 0.7–3.1)
Lymphs: 11 %
MCH: 30 pg (ref 26.6–33.0)
MCHC: 32.9 g/dL (ref 31.5–35.7)
MCV: 91 fL (ref 79–97)
Monocytes Absolute: 0.8 10*3/uL (ref 0.1–0.9)
Monocytes: 9 %
Neutrophils Absolute: 6.9 10*3/uL (ref 1.4–7.0)
Neutrophils: 78 %
Platelets: 209 10*3/uL (ref 150–450)
RBC: 5.74 x10E6/uL (ref 4.14–5.80)
RDW: 12.6 % (ref 11.6–15.4)
WBC: 8.8 10*3/uL (ref 3.4–10.8)

## 2019-04-30 NOTE — Addendum Note (Signed)
Addended by: Denita Lung on: 04/30/2019 08:22 AM   Modules accepted: Orders

## 2019-05-03 DIAGNOSIS — M5136 Other intervertebral disc degeneration, lumbar region: Secondary | ICD-10-CM | POA: Diagnosis not present

## 2019-05-03 DIAGNOSIS — M5137 Other intervertebral disc degeneration, lumbosacral region: Secondary | ICD-10-CM | POA: Diagnosis not present

## 2019-05-03 DIAGNOSIS — M9903 Segmental and somatic dysfunction of lumbar region: Secondary | ICD-10-CM | POA: Diagnosis not present

## 2019-05-03 DIAGNOSIS — M9902 Segmental and somatic dysfunction of thoracic region: Secondary | ICD-10-CM | POA: Diagnosis not present

## 2019-05-06 DIAGNOSIS — M9902 Segmental and somatic dysfunction of thoracic region: Secondary | ICD-10-CM | POA: Diagnosis not present

## 2019-05-06 DIAGNOSIS — M5136 Other intervertebral disc degeneration, lumbar region: Secondary | ICD-10-CM | POA: Diagnosis not present

## 2019-05-06 DIAGNOSIS — M9903 Segmental and somatic dysfunction of lumbar region: Secondary | ICD-10-CM | POA: Diagnosis not present

## 2019-05-06 DIAGNOSIS — M5137 Other intervertebral disc degeneration, lumbosacral region: Secondary | ICD-10-CM | POA: Diagnosis not present

## 2019-05-08 DIAGNOSIS — M9902 Segmental and somatic dysfunction of thoracic region: Secondary | ICD-10-CM | POA: Diagnosis not present

## 2019-05-08 DIAGNOSIS — M9903 Segmental and somatic dysfunction of lumbar region: Secondary | ICD-10-CM | POA: Diagnosis not present

## 2019-05-08 DIAGNOSIS — M5136 Other intervertebral disc degeneration, lumbar region: Secondary | ICD-10-CM | POA: Diagnosis not present

## 2019-05-08 DIAGNOSIS — M5137 Other intervertebral disc degeneration, lumbosacral region: Secondary | ICD-10-CM | POA: Diagnosis not present

## 2019-05-10 DIAGNOSIS — M5136 Other intervertebral disc degeneration, lumbar region: Secondary | ICD-10-CM | POA: Diagnosis not present

## 2019-05-10 DIAGNOSIS — M9903 Segmental and somatic dysfunction of lumbar region: Secondary | ICD-10-CM | POA: Diagnosis not present

## 2019-05-10 DIAGNOSIS — M9902 Segmental and somatic dysfunction of thoracic region: Secondary | ICD-10-CM | POA: Diagnosis not present

## 2019-05-10 DIAGNOSIS — M5137 Other intervertebral disc degeneration, lumbosacral region: Secondary | ICD-10-CM | POA: Diagnosis not present

## 2019-05-13 DIAGNOSIS — M5136 Other intervertebral disc degeneration, lumbar region: Secondary | ICD-10-CM | POA: Diagnosis not present

## 2019-05-13 DIAGNOSIS — M9902 Segmental and somatic dysfunction of thoracic region: Secondary | ICD-10-CM | POA: Diagnosis not present

## 2019-05-13 DIAGNOSIS — M5137 Other intervertebral disc degeneration, lumbosacral region: Secondary | ICD-10-CM | POA: Diagnosis not present

## 2019-05-13 DIAGNOSIS — M9903 Segmental and somatic dysfunction of lumbar region: Secondary | ICD-10-CM | POA: Diagnosis not present

## 2019-05-15 DIAGNOSIS — M9903 Segmental and somatic dysfunction of lumbar region: Secondary | ICD-10-CM | POA: Diagnosis not present

## 2019-05-15 DIAGNOSIS — M5137 Other intervertebral disc degeneration, lumbosacral region: Secondary | ICD-10-CM | POA: Diagnosis not present

## 2019-05-15 DIAGNOSIS — M9902 Segmental and somatic dysfunction of thoracic region: Secondary | ICD-10-CM | POA: Diagnosis not present

## 2019-05-15 DIAGNOSIS — M5136 Other intervertebral disc degeneration, lumbar region: Secondary | ICD-10-CM | POA: Diagnosis not present

## 2019-05-17 ENCOUNTER — Telehealth: Payer: Self-pay

## 2019-05-17 NOTE — Telephone Encounter (Signed)
Mr. Hauger would like to know should he fast for LABs 07-01-19. Please advise [facility]

## 2019-05-18 NOTE — Telephone Encounter (Signed)
yes

## 2019-05-20 DIAGNOSIS — M9902 Segmental and somatic dysfunction of thoracic region: Secondary | ICD-10-CM | POA: Diagnosis not present

## 2019-05-20 DIAGNOSIS — M9903 Segmental and somatic dysfunction of lumbar region: Secondary | ICD-10-CM | POA: Diagnosis not present

## 2019-05-20 DIAGNOSIS — M5137 Other intervertebral disc degeneration, lumbosacral region: Secondary | ICD-10-CM | POA: Diagnosis not present

## 2019-05-20 DIAGNOSIS — M5136 Other intervertebral disc degeneration, lumbar region: Secondary | ICD-10-CM | POA: Diagnosis not present

## 2019-05-20 NOTE — Telephone Encounter (Signed)
Pt was advised [facility] 

## 2019-05-22 DIAGNOSIS — M9903 Segmental and somatic dysfunction of lumbar region: Secondary | ICD-10-CM | POA: Diagnosis not present

## 2019-05-22 DIAGNOSIS — M9902 Segmental and somatic dysfunction of thoracic region: Secondary | ICD-10-CM | POA: Diagnosis not present

## 2019-05-22 DIAGNOSIS — M5136 Other intervertebral disc degeneration, lumbar region: Secondary | ICD-10-CM | POA: Diagnosis not present

## 2019-05-22 DIAGNOSIS — M5137 Other intervertebral disc degeneration, lumbosacral region: Secondary | ICD-10-CM | POA: Diagnosis not present

## 2019-05-24 ENCOUNTER — Ambulatory Visit (INDEPENDENT_AMBULATORY_CARE_PROVIDER_SITE_OTHER): Payer: Medicare Other | Admitting: Family Medicine

## 2019-05-24 ENCOUNTER — Encounter: Payer: Self-pay | Admitting: Family Medicine

## 2019-05-24 ENCOUNTER — Other Ambulatory Visit: Payer: Self-pay

## 2019-05-24 VITALS — Wt 150.0 lb

## 2019-05-24 DIAGNOSIS — K6289 Other specified diseases of anus and rectum: Secondary | ICD-10-CM

## 2019-05-24 NOTE — Progress Notes (Signed)
   Subjective:    Patient ID: [name], male    DOB: 08/30/1931, 85 y.o.   MRN: [id number]  HPI Documentation for virtual telephone encounter. Interactive audio and video telecommunications were attempted between this provider and patient, however he did not have access to video capability.  We continued and completed visit with audio only. The patient was located at home. The provider was located in the office. The patient did consent to this visit and is aware of possible charges through their insurance for this visit. The other persons participating in this telemedicine service were none. Time spent on call was 5 minutes and in review of previous records >9 minutes total. This virtual service is not related to other E/M service within previous 7 days. He is again having difficulty with some rectal discomfort.  He has been using an ointment on it but only did it for a couple days.  It is causing some slight irritation.   Review of Systems     Objective:   Physical Exam Alert and in no distress.       Assessment & Plan:  Anal irritation Recommend he use triple antibiotic ointment twice per day sparingly for the next several weeks.  He will call if continued difficulty.  May possibly see or refer to dermatology.

## 2019-05-28 ENCOUNTER — Telehealth: Payer: Self-pay

## 2019-05-28 ENCOUNTER — Other Ambulatory Visit: Payer: Self-pay

## 2019-05-28 DIAGNOSIS — I10 Essential (primary) hypertension: Secondary | ICD-10-CM

## 2019-05-28 MED ORDER — AMLODIPINE BESYLATE 5 MG PO TABS: 5.0000 mg | ORAL_TABLET | Freq: Every day | ORAL | 0 refills | Status: DC

## 2019-05-28 NOTE — Telephone Encounter (Signed)
Pt was advised [facility] 

## 2019-05-28 NOTE — Telephone Encounter (Signed)
Pt. Called stating he needs a refill on his Amlodipine 5mg  had one coming from his mail order but they lost it in the mail and he only has one pill left. He would like it sent in today for a 30 day supply to the [facility] on [address] and would like someone to let him know when it's been done.

## 2019-06-05 ENCOUNTER — Ambulatory Visit (INDEPENDENT_AMBULATORY_CARE_PROVIDER_SITE_OTHER): Payer: Medicare Other | Admitting: Family Medicine

## 2019-06-05 ENCOUNTER — Other Ambulatory Visit: Payer: Self-pay

## 2019-06-05 VITALS — Wt 150.0 lb

## 2019-06-05 DIAGNOSIS — K6289 Other specified diseases of anus and rectum: Secondary | ICD-10-CM | POA: Diagnosis not present

## 2019-06-05 MED ORDER — DOXYCYCLINE HYCLATE 100 MG PO TABS: 100.0000 mg | ORAL_TABLET | Freq: Two times a day (BID) | ORAL | 0 refills | Status: DC

## 2019-06-05 NOTE — Progress Notes (Signed)
   Subjective:    Patient ID: [name], male    DOB: 12-28-1931, 85 y.o.   MRN: [id number]  HPI Documentation for virtual telephone encounter:  Interactive audio and video telecommunications were attempted between this provider and patient, however he did not have access to video capability.  We continued and completed visit with audio only. The patient was located at home. The provider was located in the office. The patient did consent to this visit and is aware of possible charges through their insurance for this visit. The other persons participating in this telemedicine service were none. Time spent on call was 5 minutes  This virtual service is not related to other E/M service within previous 7 days. He continues to complain of rectal discomfort and erythema.  It is not responded to the topical medication.   Review of Systems     Objective:   Physical Exam Alert and in no distress otherwise not examined       Assessment & Plan:  Anal irritation - Plan: doxycycline (VIBRA-TABS) 100 MG tablet I explained that if this did not work, I would refer him to get another look at this.  He was comfortable with that.

## 2019-06-10 ENCOUNTER — Telehealth: Payer: Self-pay | Admitting: Family Medicine

## 2019-06-10 MED ORDER — SULFAMETHOXAZOLE-TRIMETHOPRIM 800-160 MG PO TABS: 1.0000 | ORAL_TABLET | Freq: Two times a day (BID) | ORAL | 0 refills | Status: DC

## 2019-06-10 NOTE — Telephone Encounter (Signed)
I called patient to let him know that you called in a new medication. He stated that he really isn't looking for a new medication he just wanted a call from you. I told him I would send you his message. He said if needed to do a virtual visit tomorrow he would-or if ultimately you wanted him to see derm he was also agreeable to that. Please advise, thanks.

## 2019-06-10 NOTE — Telephone Encounter (Signed)
Pt called and said the Antibiotic that was prescribed to him is giving him an upset stomach. He said its not really Diarrhea. But his stomach just don't feel right. Pt wants to know if he could try something different and said feel free to call him if you have any questions.

## 2019-06-10 NOTE — Telephone Encounter (Signed)
Let him know that I called a different medication in and have him call me when he finishes if he still having difficulty

## 2019-06-11 ENCOUNTER — Encounter: Payer: Self-pay | Admitting: Family Medicine

## 2019-06-11 ENCOUNTER — Other Ambulatory Visit: Payer: Self-pay

## 2019-06-11 ENCOUNTER — Ambulatory Visit (INDEPENDENT_AMBULATORY_CARE_PROVIDER_SITE_OTHER): Payer: Medicare Other | Admitting: Family Medicine

## 2019-06-11 VITALS — Wt 150.0 lb

## 2019-06-11 DIAGNOSIS — K6289 Other specified diseases of anus and rectum: Secondary | ICD-10-CM | POA: Diagnosis not present

## 2019-06-11 NOTE — Progress Notes (Signed)
   Subjective:    Patient ID: [name], male    DOB: 05-22-1932, 85 y.o.   MRN: [id number]  HPI Interactive audio and video telecommunications were attempted between this provider and patient, however he did not have access to video capability.  We continued and completed visit with audio only.  The patient was located at home. The provider was located in the office. The patient did consent to this visit and is aware of possible charges through their insurance for this visit. The other persons participating in this telemedicine service were none. Time spent on call was 6 minutes  This virtual service is not related to other E/M service within previous 7 days. He was placed on an antibiotic for treatment of anal irritation that was probably low-grade infection however did not tolerate the medication.  A different antibiotic was called in however he decided not to take it thinking that he might again have GI distress.  Then discussed the possibility of referring to dermatology to get another opinion on it.  He would like to pursue that.  Review of Systems     Objective:   Physical Exam  Alert and in no distress otherwise not examined      Assessment & Plan:  Anal irritation - Plan: Ambulatory referral to Dermatology I explained that we will take the first available appointment but could not say exactly when that would occur.  In the meantime he is to use a triple antibiotic ointment.

## 2019-06-18 ENCOUNTER — Ambulatory Visit: Payer: Medicare Other | Admitting: Orthopaedic Surgery

## 2019-06-18 DIAGNOSIS — L309 Dermatitis, unspecified: Secondary | ICD-10-CM | POA: Diagnosis not present

## 2019-06-18 DIAGNOSIS — L821 Other seborrheic keratosis: Secondary | ICD-10-CM | POA: Diagnosis not present

## 2019-06-18 DIAGNOSIS — D1801 Hemangioma of skin and subcutaneous tissue: Secondary | ICD-10-CM | POA: Diagnosis not present

## 2019-06-18 DIAGNOSIS — D225 Melanocytic nevi of trunk: Secondary | ICD-10-CM | POA: Diagnosis not present

## 2019-06-27 ENCOUNTER — Other Ambulatory Visit: Payer: Self-pay

## 2019-06-27 ENCOUNTER — Encounter: Payer: Self-pay | Admitting: Orthopaedic Surgery

## 2019-06-27 ENCOUNTER — Ambulatory Visit: Payer: Medicare Other | Admitting: Orthopaedic Surgery

## 2019-06-27 DIAGNOSIS — M1711 Unilateral primary osteoarthritis, right knee: Secondary | ICD-10-CM | POA: Diagnosis not present

## 2019-06-27 MED ORDER — LIDOCAINE HCL 1 % IJ SOLN
2.0000 mL | INTRAMUSCULAR | Status: AC | PRN
  Administered 2019-06-27: 2 mL

## 2019-06-27 MED ORDER — BUPIVACAINE HCL 0.25 % IJ SOLN
2.0000 mL | INTRAMUSCULAR | Status: AC | PRN
  Administered 2019-06-27: 2 mL via INTRA_ARTICULAR

## 2019-06-27 MED ORDER — METHYLPREDNISOLONE ACETATE 40 MG/ML IJ SUSP
40.0000 mg | INTRAMUSCULAR | Status: AC | PRN
  Administered 2019-06-27: 40 mg via INTRA_ARTICULAR

## 2019-06-27 NOTE — Progress Notes (Signed)
Office Visit Note   Patient: [name]           Date of Birth: 1931/09/13           MRN: [id number] Visit Date: 06/27/2019              Requested by: Denita Lung, MD [place],  [place] [address] PCP: Denita Lung, MD   Assessment & Plan: Visit Diagnoses:  1. Unilateral primary osteoarthritis, right knee     Plan: Impression is end-stage degenerative joint disease right knee.  We reinjected the right knee with cortisone.  He will follow-up with [place] as needed.  Follow-Up Instructions: Return if symptoms worsen or fail to improve.   Orders:  Orders Placed This Encounter  Procedures  . Large Joint Inj: R knee   No orders of the defined types were placed in this encounter.     Procedures: Large Joint Inj: R knee on 06/27/2019 10:03 AM Indications: pain Details: 22 G needle, anterolateral approach Medications: 2 mL bupivacaine 0.25 %; 2 mL lidocaine 1 %; 40 mg methylPREDNISolone acetate 40 MG/ML      Clinical Data: No additional findings.   Subjective: Chief Complaint  Patient presents with  . Right Knee - Pain, Injections    HPI patient is a pleasant 85 year old gentleman who presents our clinic today with recurrent right knee pain.  History of end-stage degenerative joint disease both knees.  He was seen in our office 01/10/2019 where both knees were injected with cortisone.  He has continued relief with the left knee but his right knee pain returned a few weeks ago.  Started to worsen.  Worse with activity.  He is requesting another cortisone injection.  Review of Systems as detailed in HPI.  All others reviewed and are negative.   Objective: Vital Signs: There were no vitals taken for this visit.  Physical Exam well-developed well-nourished gentleman in no acute distress.  Alert oriented x3.  Ortho Exam examination of the right knee shows no effusion.  Range of motion 0 to 120 degrees.  No joint line tenderness.  Moderate  palpable crepitus.  He is neurovascular intact distally.  Specialty Comments:  No specialty comments available.  Imaging: No new imaging   PMFS History: Patient Active Problem List   Diagnosis Date Noted  . Gastroesophageal reflux disease 04/29/2019  . Unilateral primary osteoarthritis, right knee 07/12/2018  . Pacemaker 04/21/2017  . Complete heart block ([place]) 01/11/2017  . History of prostate cancer 12/27/2010  . Arthritis 12/27/2010  . Hx of cataract surgery 12/27/2010  . HH (hiatus hernia)   . History of colonic polyps   . Hyperlipidemia LDL goal <130 04/21/2008  . Allergic rhinitis 04/21/2008  . Essential hypertension 04/18/2008   Past Medical History:  Diagnosis Date  . 1st degree AV block   . Arthritis    "hands, back, neck, shoulders, knees" (01/11/2017)  . Eczema   . GERD (gastroesophageal reflux disease)   . HH (hiatus hernia)   . History of blood transfusion    "maybe; w/prostate OR; they would have used my own" (01/11/2017)  . History of colonic polyps    "I don't think I've had any polyps" (01/11/2017)  . Hypercholesterolemia   . Hypertension   . Presence of permanent cardiac pacemaker   . Prostate cancer ([place]) 1997  . Seasonal allergies   . SVT (supraventricular tachycardia) ([facility])     Family History  Problem Relation Age of Onset  .  Cancer Mother   . Alcoholism Father     Past Surgical History:  Procedure Laterality Date  . AV NODE ABLATION  2000s X 2   AVNODE REENTRANT ABLATION  . CATARACT EXTRACTION W/ INTRAOCULAR LENS  IMPLANT, BILATERAL    . COLONOSCOPY  2007   MAGOD  . INSERT / REPLACE / REMOVE PACEMAKER  01/11/2017  . PACEMAKER IMPLANT N/A 01/11/2017   Procedure: Pacemaker Implant;  Surgeon: Evans Lance, MD;  Location: [facility];  Service: Cardiovascular;  Laterality: N/A;  . PROSTATECTOMY    . SKIN BIOPSY Right 02/04/2019   verruca vulgaris, irritated  . TESTICLE SURGERY Left 2010   "benign growth"; [place]  .  TONSILLECTOMY     Social History   Occupational History  . Not on file  Tobacco Use  . Smoking status: Former Smoker    Packs/day: 1.50    Years: 13.00    Pack years: 19.50    Types: Cigarettes    Quit date: 08/25/1962    Years since quitting: 56.8  . Smokeless tobacco: Never Used  Substance and Sexual Activity  . Alcohol use: No  . Drug use: No  . Sexual activity: Never

## 2019-07-01 ENCOUNTER — Other Ambulatory Visit: Payer: Medicare Other

## 2019-07-01 ENCOUNTER — Other Ambulatory Visit: Payer: Self-pay

## 2019-07-01 DIAGNOSIS — R899 Unspecified abnormal finding in specimens from other organs, systems and tissues: Secondary | ICD-10-CM | POA: Diagnosis not present

## 2019-07-02 DIAGNOSIS — L304 Erythema intertrigo: Secondary | ICD-10-CM | POA: Diagnosis not present

## 2019-07-02 LAB — COMPREHENSIVE METABOLIC PANEL
ALT: 16 IU/L (ref 0–44)
AST: 19 IU/L (ref 0–40)
Albumin/Globulin Ratio: 2.4 — ABNORMAL HIGH (ref 1.2–2.2)
Albumin: 5 g/dL — ABNORMAL HIGH (ref 3.6–4.6)
Alkaline Phosphatase: 113 IU/L (ref 39–117)
BUN/Creatinine Ratio: 17 (ref 10–24)
BUN: 20 mg/dL (ref 8–27)
Bilirubin Total: 0.5 mg/dL (ref 0.0–1.2)
CO2: 28 mmol/L (ref 20–29)
Calcium: 10.2 mg/dL (ref 8.6–10.2)
Chloride: 98 mmol/L (ref 96–106)
Creatinine, Ser: 1.19 mg/dL (ref 0.76–1.27)
GFR calc Af Amer: 64 mL/min/{1.73_m2} (ref >59)
GFR calc non Af Amer: 55 mL/min/{1.73_m2} — ABNORMAL LOW (ref >59)
Globulin, Total: 2.1 g/dL (ref 1.5–4.5)
Glucose: 97 mg/dL (ref 65–99)
Potassium: 3.7 mmol/L (ref 3.5–5.2)
Sodium: 142 mmol/L (ref 134–144)
Total Protein: 7.1 g/dL (ref 6.0–8.5)

## 2019-07-03 ENCOUNTER — Other Ambulatory Visit: Payer: Self-pay | Admitting: Family Medicine

## 2019-07-03 DIAGNOSIS — I10 Essential (primary) hypertension: Secondary | ICD-10-CM

## 2019-07-04 ENCOUNTER — Telehealth: Payer: Self-pay | Admitting: Family Medicine

## 2019-07-04 NOTE — Telephone Encounter (Signed)
Received fax from [place] for a refill on Alclometasone Cream pt. Last apt. 06/11/19.

## 2019-07-04 NOTE — Telephone Encounter (Signed)
Pt is scheduled for virtual visit tomorrow morning but he called back and said the Triamcinolne cream is too strong and he spoke with his insurance and they will be sending over a request for Aclometasone .5% which will be replacing Desonide .5%

## 2019-07-05 ENCOUNTER — Other Ambulatory Visit: Payer: Self-pay

## 2019-07-05 ENCOUNTER — Encounter: Payer: Self-pay | Admitting: Family Medicine

## 2019-07-05 ENCOUNTER — Ambulatory Visit (INDEPENDENT_AMBULATORY_CARE_PROVIDER_SITE_OTHER): Payer: Medicare Other | Admitting: Family Medicine

## 2019-07-05 VITALS — Temp 97.0°F | Wt 150.0 lb

## 2019-07-05 DIAGNOSIS — K6289 Other specified diseases of anus and rectum: Secondary | ICD-10-CM | POA: Diagnosis not present

## 2019-07-05 NOTE — Progress Notes (Signed)
   Subjective:    Patient ID: [name], male    DOB: March 16, 1932, 85 y.o.   MRN: [id number]  HPI Documentation for virtual telephone encounter.  Interactive audio and video telecommunications were attempted between this provider and patient, however due to patient not havnig vidio capability  We continued and completed visit with audio only.  The patient was located at home. The provider was located in the office. The patient did consent to this visit and is aware of possible charges through their insurance for this visit.  The other persons participating in this telemedicine service were none. Time spent on call was 5 minutes and in review of previous records >15 minutes total.  This virtual service is not related to other E/M service within previous 7 days. He continues to have difficulty with anal irritation.  He was seen by dermatology and given steroids which apparently did help.  He now complains of another lesion that is in the perianal area that apparently is a small fissure.  Difficult to tell as this was a virtual visit.  When he described this to the dermatologist, she said it was not her area. He then well described trying to get a steroid cream to use on other lesions.  In the past he was using desonide however his insurance would not cover that and he was given triamcinolone.  He apparently is now using that.   Review of Systems     Objective:   Physical Exam Alert and in no distress otherwise not examined      Assessment & Plan:  Anal irritation - Plan: Ambulatory referral to Gastroenterology I will go ahead and refer to GI to see if they can help determine exactly what is going on in the anal area.  Apparently dermatology was not as helpful as he would like.

## 2019-07-05 NOTE — Telephone Encounter (Signed)
Pt had an appt today and is being referred to GI. [name]

## 2019-07-09 ENCOUNTER — Telehealth: Payer: Self-pay

## 2019-07-09 MED ORDER — ALCLOMETASONE DIPROPIONATE 0.05 % EX CREA: TOPICAL_CREAM | Freq: Two times a day (BID) | CUTANEOUS | 3 refills | Status: DC

## 2019-07-09 NOTE — Telephone Encounter (Signed)
Pt. Called back stating he never got his alclometasone 0.05% cream he ordered it through [facility], pt. Last apt. 07/05/19.

## 2019-07-18 ENCOUNTER — Ambulatory Visit (INDEPENDENT_AMBULATORY_CARE_PROVIDER_SITE_OTHER): Payer: Medicare Other | Admitting: *Deleted

## 2019-07-18 DIAGNOSIS — I442 Atrioventricular block, complete: Secondary | ICD-10-CM

## 2019-07-18 LAB — CUP PACEART REMOTE DEVICE CHECK
Battery Remaining Longevity: 102 mo
Battery Voltage: 2.99 V
Brady Statistic AP VP Percent: 63.88 %
Brady Statistic AP VS Percent: 0.07 %
Brady Statistic AS VP Percent: 32.75 %
Brady Statistic AS VS Percent: 3.3 %
Brady Statistic RA Percent Paced: 63.76 %
Brady Statistic RV Percent Paced: 96.63 %
Date Time Interrogation Session: 20201224113305
Implantable Lead Implant Date: 20180620
Implantable Lead Implant Date: 20180620
Implantable Lead Location: 753859
Implantable Lead Location: 753860
Implantable Lead Model: 5076
Implantable Lead Model: 5076
Implantable Pulse Generator Implant Date: 20180620
Lead Channel Impedance Value: 304 Ohm
Lead Channel Impedance Value: 361 Ohm
Lead Channel Impedance Value: 399 Ohm
Lead Channel Impedance Value: 418 Ohm
Lead Channel Pacing Threshold Amplitude: 0.5 V
Lead Channel Pacing Threshold Amplitude: 0.75 V
Lead Channel Pacing Threshold Pulse Width: 0.4 ms
Lead Channel Pacing Threshold Pulse Width: 0.4 ms
Lead Channel Sensing Intrinsic Amplitude: 2.75 mV
Lead Channel Sensing Intrinsic Amplitude: 2.75 mV
Lead Channel Sensing Intrinsic Amplitude: 6.125 mV
Lead Channel Sensing Intrinsic Amplitude: 6.125 mV
Lead Channel Setting Pacing Amplitude: 1.5 V
Lead Channel Setting Pacing Amplitude: 2.5 V
Lead Channel Setting Pacing Pulse Width: 0.4 ms
Lead Channel Setting Sensing Sensitivity: 2 mV

## 2019-07-20 NOTE — Progress Notes (Signed)
PPM remote 

## 2019-07-22 ENCOUNTER — Telehealth: Payer: Self-pay | Admitting: Internal Medicine

## 2019-07-22 NOTE — Telephone Encounter (Signed)
Pt was advised . He stated he has all items to dress the wound. He will advise if he needs [place] . [place]

## 2019-07-22 NOTE — Telephone Encounter (Signed)
Aspirin is the only thing that he is on that can affect bleeding.  Make sure he puts the compression dressing on the and if he has problems, we can always see him

## 2019-07-22 NOTE — Telephone Encounter (Signed)
Pt cut his arm on Saturday and has been bleeding since. He wants to know on his med list what med can cause him to bleed abnormally. He has been taking aspirin. What can he do to help with bleeding. Has has applied pressure and has bandage on it

## 2019-07-29 DIAGNOSIS — L298 Other pruritus: Secondary | ICD-10-CM | POA: Diagnosis not present

## 2019-07-29 DIAGNOSIS — L820 Inflamed seborrheic keratosis: Secondary | ICD-10-CM | POA: Diagnosis not present

## 2019-07-29 DIAGNOSIS — L304 Erythema intertrigo: Secondary | ICD-10-CM | POA: Diagnosis not present

## 2019-07-29 DIAGNOSIS — L400 Psoriasis vulgaris: Secondary | ICD-10-CM | POA: Diagnosis not present

## 2019-07-30 ENCOUNTER — Other Ambulatory Visit: Payer: Self-pay | Admitting: Family Medicine

## 2019-07-30 DIAGNOSIS — L400 Psoriasis vulgaris: Secondary | ICD-10-CM | POA: Diagnosis not present

## 2019-07-30 DIAGNOSIS — E785 Hyperlipidemia, unspecified: Secondary | ICD-10-CM

## 2019-08-12 DIAGNOSIS — H40013 Open angle with borderline findings, low risk, bilateral: Secondary | ICD-10-CM | POA: Diagnosis not present

## 2019-08-12 DIAGNOSIS — H35363 Drusen (degenerative) of macula, bilateral: Secondary | ICD-10-CM | POA: Diagnosis not present

## 2019-08-12 DIAGNOSIS — H35033 Hypertensive retinopathy, bilateral: Secondary | ICD-10-CM | POA: Diagnosis not present

## 2019-08-12 DIAGNOSIS — H43813 Vitreous degeneration, bilateral: Secondary | ICD-10-CM | POA: Diagnosis not present

## 2019-08-13 ENCOUNTER — Ambulatory Visit: Payer: Medicare Other | Attending: Internal Medicine

## 2019-08-13 DIAGNOSIS — Z23 Encounter for immunization: Secondary | ICD-10-CM | POA: Insufficient documentation

## 2019-08-13 NOTE — Progress Notes (Signed)
   Covid-19 Vaccination Clinic  Name:  RILEN JERAULD    MRN: [id number] DOB: Feb 04, 1932  08/13/2019  Mr. Gratz was observed post Covid-19 immunization for 15 minutes without incidence. He was provided with Vaccine Information Sheet and instruction to access the V-Safe system.   Mr. Iskandar was instructed to call 911 with any severe reactions post vaccine: Marland Kitchen Difficulty breathing  . Swelling of your face and throat  . A fast heartbeat  . A bad rash all over your body  . Dizziness and weakness    Immunizations Administered    Name Date Dose VIS Date Route   Pfizer COVID-19 Vaccine 08/13/2019  9:50 AM 0.3 mL 07/05/2019 Intramuscular   Manufacturer: [employer], [employer]   Lot: [id number]   [place]: [id number]

## 2019-08-21 NOTE — Progress Notes (Signed)
Error

## 2019-08-29 ENCOUNTER — Telehealth: Payer: Self-pay | Admitting: Dietician

## 2019-08-29 NOTE — Telephone Encounter (Signed)
Patient was last seen by me on 01/2018 and spoke with him on the phone periodically since that time.   Patient called today reporting that he had read a book that he found very helpful for his IBS/constipaiton and wished to share this. :A New IBS Solution" by Lawson Radar, MD. He states that the Activia yogurt worked well for 2 weeks and stopped working.  He has started drinking 64 ounces of water throughout the day and states that though continuing to require the Murelax, his symptoms have resolved.  He will call for any further questions or things to share.  Antonieta Iba, RD, LDN, CDE

## 2019-09-02 ENCOUNTER — Ambulatory Visit: Payer: Medicare Other | Attending: Internal Medicine

## 2019-09-02 DIAGNOSIS — Z23 Encounter for immunization: Secondary | ICD-10-CM

## 2019-09-02 NOTE — Progress Notes (Signed)
   Covid-19 Vaccination Clinic  Name:  [name]    MRN: [id number] DOB: 1932-07-25  09/02/2019  [name] was observed post Covid-19 immunization for 15 minutes without incidence. He was provided with Vaccine Information Sheet and instruction to access the V-Safe system.   [name] was instructed to call 911 with any severe reactions post vaccine: Marland Kitchen Difficulty breathing  . Swelling of your face and throat  . A fast heartbeat  . A bad rash all over your body  . Dizziness and weakness    Immunizations Administered    Name Date Dose VIS Date Route   Pfizer COVID-19 Vaccine 09/02/2019  8:59 AM 0.3 mL 07/05/2019 Intramuscular   Manufacturer: [place]   Lot: [id number]   [place]: [id number]

## 2019-10-03 ENCOUNTER — Ambulatory Visit: Payer: Self-pay

## 2019-10-03 ENCOUNTER — Other Ambulatory Visit: Payer: Self-pay

## 2019-10-03 ENCOUNTER — Ambulatory Visit: Payer: Medicare Other | Admitting: Orthopaedic Surgery

## 2019-10-03 DIAGNOSIS — M25561 Pain in right knee: Secondary | ICD-10-CM | POA: Diagnosis not present

## 2019-10-03 DIAGNOSIS — M1711 Unilateral primary osteoarthritis, right knee: Secondary | ICD-10-CM | POA: Diagnosis not present

## 2019-10-03 DIAGNOSIS — G8929 Other chronic pain: Secondary | ICD-10-CM | POA: Diagnosis not present

## 2019-10-03 DIAGNOSIS — M1712 Unilateral primary osteoarthritis, left knee: Secondary | ICD-10-CM | POA: Diagnosis not present

## 2019-10-03 DIAGNOSIS — M25562 Pain in left knee: Secondary | ICD-10-CM

## 2019-10-03 MED ORDER — LIDOCAINE HCL 1 % IJ SOLN
2.0000 mL | INTRAMUSCULAR | Status: AC | PRN
  Administered 2019-10-03: 2 mL

## 2019-10-03 MED ORDER — METHYLPREDNISOLONE ACETATE 40 MG/ML IJ SUSP
40.0000 mg | INTRAMUSCULAR | Status: AC | PRN
  Administered 2019-10-03: 40 mg via INTRA_ARTICULAR

## 2019-10-03 MED ORDER — BUPIVACAINE HCL 0.5 % IJ SOLN
2.0000 mL | INTRAMUSCULAR | Status: AC | PRN
  Administered 2019-10-03: 2 mL via INTRA_ARTICULAR

## 2019-10-03 NOTE — Progress Notes (Signed)
Office Visit Note   Patient: [name]           Date of Birth: [birth year]           MRN: [id number] Visit Date: 10/03/2019              Requested by: Denita Lung, MD [place],  [place] [address] PCP: Denita Lung, MD   Assessment & Plan: Visit Diagnoses:  1. Chronic pain of both knees   2. Primary osteoarthritis of left knee   3. Primary osteoarthritis of right knee     Plan: both knees were injected with cortisone today.  He tolerated these well.  Follow up as needed.  Follow-Up Instructions: Return if symptoms worsen or fail to improve.   Orders:  Orders Placed This Encounter  Procedures  . Large Joint Inj: bilateral knee  . XR KNEE 3 VIEW RIGHT  . XR KNEE 3 VIEW LEFT   No orders of the defined types were placed in this encounter.     Procedures: Large Joint Inj: bilateral knee on 10/03/2019 9:14 AM Indications: pain Details: 22 G needle  Arthrogram: No  Medications (Right): 2 mL lidocaine 1 %; 2 mL bupivacaine 0.5 %; 40 mg methylPREDNISolone acetate 40 MG/ML Medications (Left): 2 mL lidocaine 1 %; 2 mL bupivacaine 0.5 %; 40 mg methylPREDNISolone acetate 40 MG/ML Outcome: tolerated well, no immediate complications Patient was prepped and draped in the usual sterile fashion.       Clinical Data: No additional findings.   Subjective: Chief Complaint  Patient presents with  . Left Knee - Pain  . Right Knee - Pain    [name] returns today for bilateral knee pain due to OA.  No changes in medical history.  Requesting injections today.     Review of Systems  Constitutional: Negative.   All other systems reviewed and are negative.    Objective: Vital Signs: There were no vitals taken for this visit.  Physical Exam Vitals and nursing note reviewed.  Constitutional:      Appearance: He is well-developed.  Pulmonary:     Effort: Pulmonary effort is normal.  Abdominal:     Palpations: Abdomen is soft.  Skin:  General: Skin is warm.  Neurological:     Mental Status: He is alert and oriented to person, place, and time.  Psychiatric:        Behavior: Behavior normal.        Thought Content: Thought content normal.        Judgment: Judgment normal.     Ortho Exam Bilateral knee exams are unchanged.  No joint effusion. Specialty Comments:  No specialty comments available.  Imaging: XR KNEE 3 VIEW LEFT  Result Date: 10/03/2019 Moderate DJD  XR KNEE 3 VIEW RIGHT  Result Date: 10/03/2019 Advanced DJD    PMFS History: Patient Active Problem List   Diagnosis Date Noted  . Primary osteoarthritis of left knee 10/03/2019  . Primary osteoarthritis of right knee 10/03/2019  . Gastroesophageal reflux disease 04/29/2019  . Unilateral primary osteoarthritis, right knee 07/12/2018  . Pacemaker 04/21/2017  . Complete heart block ([facility]) 01/11/2017  . History of prostate cancer 12/27/2010  . Arthritis 12/27/2010  . Hx of cataract surgery 12/27/2010  . HH (hiatus hernia)   . History of colonic polyps   . Hyperlipidemia LDL goal <130 04/21/2008  . Allergic rhinitis 04/21/2008  . Essential hypertension 04/18/2008   Past Medical History:  Diagnosis Date  .  1st degree AV block   . Arthritis    "hands, back, neck, shoulders, knees" (01/11/2017)  . Eczema   . GERD (gastroesophageal reflux disease)   . HH (hiatus hernia)   . History of blood transfusion    "maybe; w/prostate OR; they would have used my own" (01/11/2017)  . History of colonic polyps    "I don't think I've had any polyps" (01/11/2017)  . Hypercholesterolemia   . Hypertension   . Presence of permanent cardiac pacemaker   . Prostate cancer ([facility]) 1997  . Seasonal allergies   . SVT (supraventricular tachycardia) ([facility])     Family History  Problem Relation Age of Onset  . Cancer Mother   . Alcoholism Father     Past Surgical History:  Procedure Laterality Date  . AV NODE ABLATION  2000s X 2   AVNODE REENTRANT ABLATION  .  CATARACT EXTRACTION W/ INTRAOCULAR LENS  IMPLANT, BILATERAL    . COLONOSCOPY  2007   MAGOD  . INSERT / REPLACE / REMOVE PACEMAKER  01/11/2017  . PACEMAKER IMPLANT N/A 01/11/2017   Procedure: Pacemaker Implant;  Surgeon: Evans Lance, MD;  Location: [facility];  Service: Cardiovascular;  Laterality: N/A;  . PROSTATECTOMY    . SKIN BIOPSY Right 02/04/2019   verruca vulgaris, irritated  . TESTICLE SURGERY Left 2010   "benign growth"; [facility]  . TONSILLECTOMY     Social History   Occupational History  . Not on file  Tobacco Use  . Smoking status: Former Smoker    Packs/day: 1.50    Years: 13.00    Pack years: 19.50    Types: Cigarettes    Quit date: 08/25/1962    Years since quitting: 57.1  . Smokeless tobacco: Never Used  Substance and Sexual Activity  . Alcohol use: No  . Drug use: No  . Sexual activity: Never

## 2019-10-17 ENCOUNTER — Ambulatory Visit (INDEPENDENT_AMBULATORY_CARE_PROVIDER_SITE_OTHER): Payer: Medicare Other | Admitting: *Deleted

## 2019-10-17 DIAGNOSIS — I442 Atrioventricular block, complete: Secondary | ICD-10-CM

## 2019-10-21 LAB — CUP PACEART REMOTE DEVICE CHECK
Battery Remaining Longevity: 99 mo
Battery Voltage: 2.99 V
Brady Statistic AP VP Percent: 66.85 %
Brady Statistic AP VS Percent: 0.08 %
Brady Statistic AS VP Percent: 30.69 %
Brady Statistic AS VS Percent: 2.38 %
Brady Statistic RA Percent Paced: 66.76 %
Brady Statistic RV Percent Paced: 97.55 %
Date Time Interrogation Session: 20210329080706
Implantable Lead Implant Date: 20180620
Implantable Lead Implant Date: 20180620
Implantable Lead Location: 753859
Implantable Lead Location: 753860
Implantable Lead Model: 5076
Implantable Lead Model: 5076
Implantable Pulse Generator Implant Date: 20180620
Lead Channel Impedance Value: 304 Ohm
Lead Channel Impedance Value: 361 Ohm
Lead Channel Impedance Value: 399 Ohm
Lead Channel Impedance Value: 418 Ohm
Lead Channel Pacing Threshold Amplitude: 0.5 V
Lead Channel Pacing Threshold Amplitude: 0.875 V
Lead Channel Pacing Threshold Pulse Width: 0.4 ms
Lead Channel Pacing Threshold Pulse Width: 0.4 ms
Lead Channel Sensing Intrinsic Amplitude: 3 mV
Lead Channel Sensing Intrinsic Amplitude: 3 mV
Lead Channel Sensing Intrinsic Amplitude: 5.625 mV
Lead Channel Sensing Intrinsic Amplitude: 5.625 mV
Lead Channel Setting Pacing Amplitude: 1.5 V
Lead Channel Setting Pacing Amplitude: 2.5 V
Lead Channel Setting Pacing Pulse Width: 0.4 ms
Lead Channel Setting Sensing Sensitivity: 2 mV

## 2019-10-21 NOTE — Progress Notes (Signed)
PPM Remote  

## 2019-11-04 DIAGNOSIS — D692 Other nonthrombocytopenic purpura: Secondary | ICD-10-CM | POA: Diagnosis not present

## 2019-11-04 DIAGNOSIS — L304 Erythema intertrigo: Secondary | ICD-10-CM | POA: Diagnosis not present

## 2019-11-04 DIAGNOSIS — L309 Dermatitis, unspecified: Secondary | ICD-10-CM | POA: Diagnosis not present

## 2019-12-10 ENCOUNTER — Other Ambulatory Visit: Payer: Self-pay | Admitting: Family Medicine

## 2019-12-10 DIAGNOSIS — I10 Essential (primary) hypertension: Secondary | ICD-10-CM

## 2019-12-11 DIAGNOSIS — L249 Irritant contact dermatitis, unspecified cause: Secondary | ICD-10-CM | POA: Diagnosis not present

## 2019-12-11 DIAGNOSIS — L400 Psoriasis vulgaris: Secondary | ICD-10-CM | POA: Diagnosis not present

## 2019-12-11 DIAGNOSIS — L409 Psoriasis, unspecified: Secondary | ICD-10-CM | POA: Diagnosis not present

## 2019-12-11 DIAGNOSIS — D485 Neoplasm of uncertain behavior of skin: Secondary | ICD-10-CM | POA: Diagnosis not present

## 2020-01-13 DIAGNOSIS — L400 Psoriasis vulgaris: Secondary | ICD-10-CM | POA: Diagnosis not present

## 2020-01-17 ENCOUNTER — Telehealth: Payer: Self-pay

## 2020-01-17 NOTE — Telephone Encounter (Signed)
Spoke with patient to remind of missed remote transmission. Error code 0370, referred to [employer] tech support.

## 2020-01-22 ENCOUNTER — Ambulatory Visit (INDEPENDENT_AMBULATORY_CARE_PROVIDER_SITE_OTHER): Payer: Medicare Other | Admitting: *Deleted

## 2020-01-22 ENCOUNTER — Telehealth: Payer: Self-pay

## 2020-01-22 DIAGNOSIS — I442 Atrioventricular block, complete: Secondary | ICD-10-CM | POA: Diagnosis not present

## 2020-01-22 NOTE — Telephone Encounter (Signed)
I help the pt with transmission. Transmission received and added to the schedule.

## 2020-01-22 NOTE — Telephone Encounter (Signed)
The pt received his home monitor and it is updating. He is going to call me when it is done.

## 2020-01-23 LAB — CUP PACEART REMOTE DEVICE CHECK
Battery Remaining Longevity: 94 mo
Battery Voltage: 2.98 V
Brady Statistic AP VP Percent: 65.11 %
Brady Statistic AP VS Percent: 0.08 %
Brady Statistic AS VP Percent: 30.99 %
Brady Statistic AS VS Percent: 3.82 %
Brady Statistic RA Percent Paced: 65.26 %
Brady Statistic RV Percent Paced: 96.11 %
Date Time Interrogation Session: 20210630122031
Implantable Lead Implant Date: 20180620
Implantable Lead Implant Date: 20180620
Implantable Lead Location: 753859
Implantable Lead Location: 753860
Implantable Lead Model: 5076
Implantable Lead Model: 5076
Implantable Pulse Generator Implant Date: 20180620
Lead Channel Impedance Value: 285 Ohm
Lead Channel Impedance Value: 342 Ohm
Lead Channel Impedance Value: 380 Ohm
Lead Channel Impedance Value: 399 Ohm
Lead Channel Pacing Threshold Amplitude: 0.5 V
Lead Channel Pacing Threshold Amplitude: 0.875 V
Lead Channel Pacing Threshold Pulse Width: 0.4 ms
Lead Channel Pacing Threshold Pulse Width: 0.4 ms
Lead Channel Sensing Intrinsic Amplitude: 3.5 mV
Lead Channel Sensing Intrinsic Amplitude: 3.5 mV
Lead Channel Sensing Intrinsic Amplitude: 5.125 mV
Lead Channel Sensing Intrinsic Amplitude: 5.125 mV
Lead Channel Setting Pacing Amplitude: 1.5 V
Lead Channel Setting Pacing Amplitude: 2.5 V
Lead Channel Setting Pacing Pulse Width: 0.4 ms
Lead Channel Setting Sensing Sensitivity: 2 mV

## 2020-01-23 NOTE — Progress Notes (Signed)
Remote pacemaker transmission.   

## 2020-02-10 ENCOUNTER — Other Ambulatory Visit: Payer: Self-pay | Admitting: Family Medicine

## 2020-02-10 DIAGNOSIS — K449 Diaphragmatic hernia without obstruction or gangrene: Secondary | ICD-10-CM

## 2020-02-13 ENCOUNTER — Encounter: Payer: Self-pay | Admitting: Orthopaedic Surgery

## 2020-02-13 ENCOUNTER — Ambulatory Visit: Payer: Medicare Other | Admitting: Orthopaedic Surgery

## 2020-02-13 VITALS — Ht 66.0 in | Wt 153.0 lb

## 2020-02-13 DIAGNOSIS — M1711 Unilateral primary osteoarthritis, right knee: Secondary | ICD-10-CM

## 2020-02-15 DIAGNOSIS — M1711 Unilateral primary osteoarthritis, right knee: Secondary | ICD-10-CM | POA: Diagnosis not present

## 2020-02-15 MED ORDER — LIDOCAINE HCL 1 % IJ SOLN
2.0000 mL | INTRAMUSCULAR | Status: AC | PRN
  Administered 2020-02-15: 2 mL

## 2020-02-15 MED ORDER — METHYLPREDNISOLONE ACETATE 40 MG/ML IJ SUSP
40.0000 mg | INTRAMUSCULAR | Status: AC | PRN
  Administered 2020-02-15: 40 mg via INTRA_ARTICULAR

## 2020-02-15 MED ORDER — BUPIVACAINE HCL 0.5 % IJ SOLN
2.0000 mL | INTRAMUSCULAR | Status: AC | PRN
  Administered 2020-02-15: 2 mL via INTRA_ARTICULAR

## 2020-02-15 NOTE — Progress Notes (Signed)
Office Visit Note   Patient: [name]           Date of Birth: 1931-11-06           MRN: [id number] Visit Date: 02/13/2020              Requested by: Denita Lung, MD [place],  [place] [id number] PCP: Denita Lung, MD   Assessment & Plan: Visit Diagnoses:  1. Unilateral primary osteoarthritis, right knee     Plan: We repeated the cortisone injection into his right knee today.  Follow-up as needed.  Follow-Up Instructions: Return if symptoms worsen or fail to improve.   Orders:  No orders of the defined types were placed in this encounter.  No orders of the defined types were placed in this encounter.     Procedures: Large Joint Inj: R knee on 02/15/2020 12:01 PM Indications: pain Details: 22 G needle  Arthrogram: No  Medications: 40 mg methylPREDNISolone acetate 40 MG/ML; 2 mL lidocaine 1 %; 2 mL bupivacaine 0.5 % Consent was given by the patient. Patient was prepped and draped in the usual sterile fashion.       Clinical Data: No additional findings.   Subjective: Chief Complaint  Patient presents with  . Right Knee - Pain    [name] comes in today for follow-up of his right knee DJD.  He is looking for another cortisone injection today.  The last one was about 4 months ago which gave him good relief.   Review of Systems   Objective: Vital Signs: Ht 5\' 6"  (1.676 m)   Wt 153 lb (69.4 kg)   BMI 24.69 kg/m   Physical Exam  Ortho Exam Right knee exam is stable. Specialty Comments:  No specialty comments available.  Imaging: No results found.   PMFS History: Patient Active Problem List   Diagnosis Date Noted  . Primary osteoarthritis of left knee 10/03/2019  . Primary osteoarthritis of right knee 10/03/2019  . Gastroesophageal reflux disease 04/29/2019  . Unilateral primary osteoarthritis, right knee 07/12/2018  . Pacemaker 04/21/2017  . Complete heart block ([facility]) 01/11/2017  . History of prostate cancer  12/27/2010  . Arthritis 12/27/2010  . Hx of cataract surgery 12/27/2010  . HH (hiatus hernia)   . History of colonic polyps   . Hyperlipidemia LDL goal <130 04/21/2008  . Allergic rhinitis 04/21/2008  . Essential hypertension 04/18/2008   Past Medical History:  Diagnosis Date  . 1st degree AV block   . Arthritis    "hands, back, neck, shoulders, knees" (01/11/2017)  . Eczema   . GERD (gastroesophageal reflux disease)   . HH (hiatus hernia)   . History of blood transfusion    "maybe; w/prostate OR; they would have used my own" (01/11/2017)  . History of colonic polyps    "I don't think I've had any polyps" (01/11/2017)  . Hypercholesterolemia   . Hypertension   . Presence of permanent cardiac pacemaker   . Prostate cancer ([facility]) 1997  . Seasonal allergies   . SVT (supraventricular tachycardia) ([facility])     Family History  Problem Relation Age of Onset  . Cancer Mother   . Alcoholism Father     Past Surgical History:  Procedure Laterality Date  . AV NODE ABLATION  2000s X 2   AVNODE REENTRANT ABLATION  . CATARACT EXTRACTION W/ INTRAOCULAR LENS  IMPLANT, BILATERAL    . COLONOSCOPY  2007   MAGOD  . INSERT / REPLACE /  REMOVE PACEMAKER  01/11/2017  . PACEMAKER IMPLANT N/A 01/11/2017   Procedure: Pacemaker Implant;  Surgeon: Evans Lance, MD;  Location: [facility];  Service: Cardiovascular;  Laterality: N/A;  . PROSTATECTOMY    . SKIN BIOPSY Right 02/04/2019   verruca vulgaris, irritated  . TESTICLE SURGERY Left 2010   "benign growth"; [facility]  . TONSILLECTOMY     Social History   Occupational History  . Not on file  Tobacco Use  . Smoking status: Former Smoker    Packs/day: 1.50    Years: 13.00    Pack years: 19.50    Types: Cigarettes    Quit date: 08/25/1962    Years since quitting: 57.5  . Smokeless tobacco: Never Used  Vaping Use  . Vaping Use: Never used  Substance and Sexual Activity  . Alcohol use: No  . Drug use: No  . Sexual activity: Never

## 2020-03-09 DIAGNOSIS — H40013 Open angle with borderline findings, low risk, bilateral: Secondary | ICD-10-CM | POA: Diagnosis not present

## 2020-04-22 ENCOUNTER — Ambulatory Visit (INDEPENDENT_AMBULATORY_CARE_PROVIDER_SITE_OTHER): Payer: Medicare Other | Admitting: Emergency Medicine

## 2020-04-22 DIAGNOSIS — I442 Atrioventricular block, complete: Secondary | ICD-10-CM | POA: Diagnosis not present

## 2020-04-23 LAB — CUP PACEART REMOTE DEVICE CHECK
Battery Remaining Longevity: 90 mo
Battery Voltage: 2.98 V
Brady Statistic AP VP Percent: 64.62 %
Brady Statistic AP VS Percent: 0.05 %
Brady Statistic AS VP Percent: 32.27 %
Brady Statistic AS VS Percent: 3.06 %
Brady Statistic RA Percent Paced: 64.74 %
Brady Statistic RV Percent Paced: 96.89 %
Date Time Interrogation Session: 20210928194306
Implantable Lead Implant Date: 20180620
Implantable Lead Implant Date: 20180620
Implantable Lead Location: 753859
Implantable Lead Location: 753860
Implantable Lead Model: 5076
Implantable Lead Model: 5076
Implantable Pulse Generator Implant Date: 20180620
Lead Channel Impedance Value: 304 Ohm
Lead Channel Impedance Value: 361 Ohm
Lead Channel Impedance Value: 380 Ohm
Lead Channel Impedance Value: 399 Ohm
Lead Channel Pacing Threshold Amplitude: 0.5 V
Lead Channel Pacing Threshold Amplitude: 0.875 V
Lead Channel Pacing Threshold Pulse Width: 0.4 ms
Lead Channel Pacing Threshold Pulse Width: 0.4 ms
Lead Channel Sensing Intrinsic Amplitude: 3.625 mV
Lead Channel Sensing Intrinsic Amplitude: 3.625 mV
Lead Channel Sensing Intrinsic Amplitude: 5.375 mV
Lead Channel Sensing Intrinsic Amplitude: 5.375 mV
Lead Channel Setting Pacing Amplitude: 1.5 V
Lead Channel Setting Pacing Amplitude: 2.5 V
Lead Channel Setting Pacing Pulse Width: 0.4 ms
Lead Channel Setting Sensing Sensitivity: 2 mV

## 2020-04-24 NOTE — Progress Notes (Signed)
Remote pacemaker transmission.   

## 2020-04-30 ENCOUNTER — Ambulatory Visit (INDEPENDENT_AMBULATORY_CARE_PROVIDER_SITE_OTHER): Payer: Medicare Other | Admitting: Family Medicine

## 2020-04-30 ENCOUNTER — Other Ambulatory Visit: Payer: Self-pay

## 2020-04-30 ENCOUNTER — Encounter: Payer: Self-pay | Admitting: Family Medicine

## 2020-04-30 ENCOUNTER — Telehealth: Payer: Self-pay | Admitting: Family Medicine

## 2020-04-30 VITALS — BP 150/96 | HR 60 | Temp 97.3°F | Ht 65.0 in | Wt 149.0 lb

## 2020-04-30 DIAGNOSIS — D692 Other nonthrombocytopenic purpura: Secondary | ICD-10-CM

## 2020-04-30 DIAGNOSIS — E785 Hyperlipidemia, unspecified: Secondary | ICD-10-CM

## 2020-04-30 DIAGNOSIS — Z8546 Personal history of malignant neoplasm of prostate: Secondary | ICD-10-CM

## 2020-04-30 DIAGNOSIS — I442 Atrioventricular block, complete: Secondary | ICD-10-CM

## 2020-04-30 DIAGNOSIS — K219 Gastro-esophageal reflux disease without esophagitis: Secondary | ICD-10-CM

## 2020-04-30 DIAGNOSIS — J309 Allergic rhinitis, unspecified: Secondary | ICD-10-CM

## 2020-04-30 DIAGNOSIS — M1990 Unspecified osteoarthritis, unspecified site: Secondary | ICD-10-CM

## 2020-04-30 DIAGNOSIS — Z23 Encounter for immunization: Secondary | ICD-10-CM | POA: Diagnosis not present

## 2020-04-30 DIAGNOSIS — Z0000 Encounter for general adult medical examination without abnormal findings: Secondary | ICD-10-CM | POA: Diagnosis not present

## 2020-04-30 DIAGNOSIS — K5900 Constipation, unspecified: Secondary | ICD-10-CM | POA: Insufficient documentation

## 2020-04-30 DIAGNOSIS — I10 Essential (primary) hypertension: Secondary | ICD-10-CM | POA: Diagnosis not present

## 2020-04-30 DIAGNOSIS — Z950 Presence of cardiac pacemaker: Secondary | ICD-10-CM

## 2020-04-30 DIAGNOSIS — N1831 Chronic kidney disease, stage 3a: Secondary | ICD-10-CM

## 2020-04-30 DIAGNOSIS — Z9849 Cataract extraction status, unspecified eye: Secondary | ICD-10-CM

## 2020-04-30 DIAGNOSIS — K581 Irritable bowel syndrome with constipation: Secondary | ICD-10-CM | POA: Insufficient documentation

## 2020-04-30 MED ORDER — HYDROCHLOROTHIAZIDE 12.5 MG PO TABS: 12.5000 mg | ORAL_TABLET | Freq: Every day | ORAL | 3 refills | Status: DC

## 2020-04-30 MED ORDER — PRAVASTATIN SODIUM 40 MG PO TABS: 40.0000 mg | ORAL_TABLET | Freq: Every day | ORAL | 3 refills | Status: DC

## 2020-04-30 NOTE — Telephone Encounter (Signed)
Pt wants to know if he should take the Claritin at night or does it matter?

## 2020-04-30 NOTE — Patient Instructions (Signed)
  [name] , Thank you for taking time to come for your Medicare Wellness Visit. I appreciate your ongoing commitment to your health goals. Please review the following plan we discussed and let me know if I can assist you in the future.   These are the goals we discussed: Try Rhinocort and Claritin  This is a list of the screening recommended for you and due dates:  Health Maintenance  Topic Date Due  . Tetanus Vaccine  01/02/2022  . COVID-19 Vaccine  Completed  . Pneumonia vaccines  Completed  . Flu Shot  Discontinued

## 2020-04-30 NOTE — Progress Notes (Signed)
[name] is a 86 y.o. male who presents for annual wellness visit,CPE and follow-up on chronic medical conditions.  He has hyperlipidemia and continues on Pravachol.  He is also taking HCTZ for his blood pressure as well as amlodipine.  He has had some difficulty with allergies.  He has been using Flonase but still having some rhinorrhea.  He also uses a Nettie pot.  Reflux seems to be under good control.  He does have a previous history of prostate cancer but no further intervention needed he does have difficulty with arthritis and seems to be doing well with conservative care and has had several injections from orthopedics.  He has had his cataract removed.  He does have a pacemaker and is followed regularly by cardiology.  He does have eczema and has been using topical medications with good results.  Constipation seems to be pretty well controlled with MiraLAX.  Is also noted easy bruising of his forearms.   Immunizations and Health Maintenance Immunization History  Administered Date(s) Administered   PFIZER SARS-COV-2 Vaccination 08/13/2019, 09/02/2019   Pneumococcal Conjugate-13 01/20/2014   Pneumococcal Polysaccharide-23 01/03/2012   Tdap 05/25/2000, 01/03/2012   Zoster Recombinat (Shingrix) 10/26/2017   There are no preventive care reminders to display for this patient.  Last colonoscopy: 07/25/05 Last PSA: unknown Dentist: Q Six months Ophtho: Q six months Exercise: biking, stretching and weight 7 days a week for 40 min a day  Other doctors caring for patient include: Dr. Lovena Le cardio, Dr Erlinda Hong ortho  Advanced Directives: Does Patient Have a Medical Advance Directive?: Yes Type of Advance Directive: [place] in Chart?: Yes - validated most recent copy scanned in chart (See row information)  Depression screen:  See questionnaire below.     Depression screen [facility] 2/9 04/30/2020 04/29/2019 04/23/2018 02/05/2018 04/21/2017   Decreased Interest 0 0 0 0 0  Down, Depressed, Hopeless 0 0 0 0 0  PHQ - 2 Score 0 0 0 0 0    Fall Screen: See Questionaire below.   Fall Risk  04/30/2020 04/29/2019 04/23/2018 02/05/2018 04/21/2017  Falls in the past year? 0 0 No No No    ADL screen:  See questionnaire below.  Functional Status Survey: Is the patient deaf or have difficulty hearing?: Yes Does the patient have difficulty seeing, even when wearing glasses/contacts?: No Does the patient have difficulty concentrating, remembering, or making decisions?: No Does the patient have difficulty walking or climbing stairs?: No Does the patient have difficulty dressing or bathing?: No Does the patient have difficulty doing errands alone such as visiting a doctor's office or shopping?: No   Review of Systems  Constitutional: -, -unexpected weight change, -anorexia, -fatigue  Dermatology: denies changing moles,, lumps ENT:, -ear pain, -sore throat,  Cardiology:  -chest pain, -palpitations, -orthopnea, Respiratory: -cough, -shortness of breath, -dyspnea on exertion, -wheezing,  Gastroenterology: -abdominal pain, -nausea, -vomiting, -diarrhea, -dysphagia Hematology: -bleeding Musculoskeletal:-myalgias, -joint swelling, -back pain, - Ophthalmology: -vision changes,  Urology: -dysuria, -difficulty urinating,  -urinary frequency, -urgency, incontinence Neurology: -, -numbness, , -memory loss, -falls, -dizziness    PHYSICAL EXAM: General Appearance: Alert, cooperative, no distress, appears stated age Head: Normocephalic, without obvious abnormality, atraumatic Eyes: PERRL, conjunctiva/corneas clear, EOM's intact, Ears: Normal TM's and external ear canals Nose: Nares normal, mucosa normal, no drainage or sinus   tenderness Throat: Lips, mucosa, and tongue normal; teeth and gums normal Neck: Supple, no lymphadenopathy, thyroid:no enlargement/tenderness/nodules; no carotid bruit or JVD Lungs:  Clear to auscultation bilaterally  without wheezes, rales or ronchi; respirations unlabored Heart: Irregular rate and rhythm, S1 and S2 normal, no murmur, rub or gallop Extremities: No clubbing, cyanosis or edema  Skin: Purpuric lesions noted on both forearms. Lymph nodes: Cervical, supraclavicular, and axillary nodes normal Neurologic: CNII-XII intact, normal strength, sensation and gait; reflexes 2+ and symmetric throughout   Psych: Normal mood, affect, hygiene and grooming  ASSESSMENT/PLAN: Hyperlipidemia LDL goal <130 - Plan: Lipid panel, pravastatin (PRAVACHOL) 40 MG tablet  Essential hypertension - Plan: CBC with Differential/Platelet, Comprehensive metabolic panel, hydrochlorothiazide (HYDRODIURIL) 12.5 MG tablet  Allergic rhinitis, unspecified seasonality, unspecified trigger      Recommend he switch to Rhinocort and add Claritin to his regimen. History of prostate cancer     No follow-up at this point needed  arthritis     He will continue to be followed by orthopedics and use his           sleeve Complete heart block ([facility])      Continue follow-up with cardiology Senile purpura ([facility] [facility])  Pacemaker      Continue follow-up with cardiology History of cataract extraction, unspecified laterality  Gastroesophageal reflux disease, unspecified whether esophagitis present    Continue to use Prilosec Stage 3a chronic kidney disease ([facility])    Continue monitoring  immunization, viral disease - Plan: Pfizer SARS-COV-2 Vaccine  Constipation, unspecified constipation type      Continue to use MiraLAX Strongly encouraged him to stay as physically active as he can in regard to his orthopedic/arthritic conditions as well as his underlying heart trouble. . Immunization recommendations discussed.   Medicare Attestation I have personally reviewed: The patient's medical and social history Their use of alcohol, tobacco or illicit drugs Their current medications and supplements The patient's functional ability including  ADLs,fall risks, home safety risks, cognitive, and hearing and visual impairment Diet and physical activities Evidence for depression or mood disorders  The patient's weight, height, and BMI have been recorded in the chart.  I have made referrals, counseling, and provided education to the patient based on review of the above and I have provided the patient with a written personalized care plan for preventive services.     Jill Alexanders, MD   04/30/2020

## 2020-04-30 NOTE — Telephone Encounter (Signed)
Does not matter

## 2020-04-30 NOTE — Telephone Encounter (Signed)
Pt states he didn't remind you that he was taking the cetirizine qhs so he wants to know if he continues taking it along with the Claritin or should he stop it and also when should he take the Claritin?

## 2020-04-30 NOTE — Telephone Encounter (Signed)
Having stopped the cetirizine and switch to the Claritin

## 2020-05-01 LAB — CBC WITH DIFFERENTIAL/PLATELET
Basophils Absolute: 0.1 10*3/uL (ref 0.0–0.2)
Basos: 1 %
EOS (ABSOLUTE): 0.4 10*3/uL (ref 0.0–0.4)
Eos: 5 %
Hematocrit: 49.1 % (ref 37.5–51.0)
Hemoglobin: 16.3 g/dL (ref 13.0–17.7)
Immature Grans (Abs): 0 10*3/uL (ref 0.0–0.1)
Immature Granulocytes: 0 %
Lymphocytes Absolute: 0.6 10*3/uL — ABNORMAL LOW (ref 0.7–3.1)
Lymphs: 9 %
MCH: 29.8 pg (ref 26.6–33.0)
MCHC: 33.2 g/dL (ref 31.5–35.7)
MCV: 90 fL (ref 79–97)
Monocytes Absolute: 0.7 10*3/uL (ref 0.1–0.9)
Monocytes: 9 %
Neutrophils Absolute: 5.5 10*3/uL (ref 1.4–7.0)
Neutrophils: 76 %
Platelets: 200 10*3/uL (ref 150–450)
RBC: 5.47 x10E6/uL (ref 4.14–5.80)
RDW: 12 % (ref 11.6–15.4)
WBC: 7.2 10*3/uL (ref 3.4–10.8)

## 2020-05-01 LAB — LIPID PANEL
Chol/HDL Ratio: 2.8 ratio (ref 0.0–5.0)
Cholesterol, Total: 190 mg/dL (ref 100–199)
HDL: 68 mg/dL (ref >39)
LDL Chol Calc (NIH): 101 mg/dL — ABNORMAL HIGH (ref 0–99)
Triglycerides: 120 mg/dL (ref 0–149)
VLDL Cholesterol Cal: 21 mg/dL (ref 5–40)

## 2020-05-01 LAB — COMPREHENSIVE METABOLIC PANEL
ALT: 21 IU/L (ref 0–44)
AST: 25 IU/L (ref 0–40)
Albumin/Globulin Ratio: 1.9 (ref 1.2–2.2)
Albumin: 5.1 g/dL — ABNORMAL HIGH (ref 3.6–4.6)
Alkaline Phosphatase: 123 IU/L — ABNORMAL HIGH (ref 44–121)
BUN/Creatinine Ratio: 13 (ref 10–24)
BUN: 14 mg/dL (ref 8–27)
Bilirubin Total: 0.4 mg/dL (ref 0.0–1.2)
CO2: 26 mmol/L (ref 20–29)
Calcium: 10.4 mg/dL — ABNORMAL HIGH (ref 8.6–10.2)
Chloride: 95 mmol/L — ABNORMAL LOW (ref 96–106)
Creatinine, Ser: 1.08 mg/dL (ref 0.76–1.27)
GFR calc Af Amer: 71 mL/min/{1.73_m2} (ref >59)
GFR calc non Af Amer: 62 mL/min/{1.73_m2} (ref >59)
Globulin, Total: 2.7 g/dL (ref 1.5–4.5)
Glucose: 71 mg/dL (ref 65–99)
Potassium: 3.8 mmol/L (ref 3.5–5.2)
Sodium: 138 mmol/L (ref 134–144)
Total Protein: 7.8 g/dL (ref 6.0–8.5)

## 2020-05-01 NOTE — Telephone Encounter (Signed)
Pt called and message was relayed.

## 2020-05-08 ENCOUNTER — Telehealth: Payer: Self-pay | Admitting: Physical Medicine and Rehabilitation

## 2020-05-08 NOTE — Telephone Encounter (Signed)
Is auth needed for [phone]? Scheduled for 11/8 with driver and no blood thinners.

## 2020-05-08 NOTE — Telephone Encounter (Signed)
Patient called needing to schedule an appointment with Dr. Ernestina Patches for his back. The number to contact patient is [phone]

## 2020-05-08 NOTE — Telephone Encounter (Signed)
Ok double check BT

## 2020-05-08 NOTE — Telephone Encounter (Signed)
Patient had right L4-5 IL on 02/14/2019. Ok to repeat if helped, same problem/side, and no new injury?

## 2020-05-08 NOTE — Telephone Encounter (Signed)
Pt not req Auth#. 

## 2020-05-12 ENCOUNTER — Telehealth: Payer: Self-pay

## 2020-05-12 NOTE — Telephone Encounter (Signed)
Pt. Called stating that the nasal spray you recommended didn't work so he got some Nasacort OTC and that seems to be working he is using it twice daily. He just wanted to let you know.

## 2020-06-01 ENCOUNTER — Other Ambulatory Visit: Payer: Self-pay

## 2020-06-01 ENCOUNTER — Ambulatory Visit (INDEPENDENT_AMBULATORY_CARE_PROVIDER_SITE_OTHER): Payer: Medicare Other | Admitting: Physical Medicine and Rehabilitation

## 2020-06-01 ENCOUNTER — Encounter: Payer: Self-pay | Admitting: Physical Medicine and Rehabilitation

## 2020-06-01 ENCOUNTER — Ambulatory Visit: Payer: Self-pay

## 2020-06-01 VITALS — BP 164/73 | HR 69

## 2020-06-01 DIAGNOSIS — M545 Low back pain, unspecified: Secondary | ICD-10-CM

## 2020-06-01 DIAGNOSIS — M48062 Spinal stenosis, lumbar region with neurogenic claudication: Secondary | ICD-10-CM

## 2020-06-01 DIAGNOSIS — M47816 Spondylosis without myelopathy or radiculopathy, lumbar region: Secondary | ICD-10-CM

## 2020-06-01 DIAGNOSIS — M5416 Radiculopathy, lumbar region: Secondary | ICD-10-CM | POA: Diagnosis not present

## 2020-06-01 DIAGNOSIS — G8929 Other chronic pain: Secondary | ICD-10-CM

## 2020-06-01 MED ORDER — BETAMETHASONE SOD PHOS & ACET 6 (3-3) MG/ML IJ SUSP
12.0000 mg | Freq: Once | INTRAMUSCULAR | Status: AC
  Administered 2020-06-01: 12 mg

## 2020-06-01 NOTE — Progress Notes (Signed)
Pt state lower back pain the travels up his back to his neck. Pt state any kind of movement makes this back worse. Pt state he excise everyday to help ease the pain.  Numeric Pain Rating Scale and Functional Assessment Average Pain 2   In the last MONTH (on 0-10 scale) has pain interfered with the following?  1. General activity like being  able to carry out your everyday physical activities such as walking, climbing stairs, carrying groceries, or moving a chair?  Rating(5)   +Driver, -BT, -Dye Allergies.

## 2020-06-01 NOTE — Procedures (Signed)
Lumbar Epidural Steroid Injection - Interlaminar Approach with Fluoroscopic Guidance  Patient: [name]      Date of Birth: 25-Nov-1931 MRN: [id number] PCP: Denita Lung, MD      Visit Date: 06/01/2020   Universal Protocol:     Consent Given By: the patient  Position: PRONE  Additional Comments: Vital signs were monitored before and after the procedure. Patient was prepped and draped in the usual sterile fashion. The correct patient, procedure, and site was verified.   Injection Procedure Details:   Procedure diagnoses:  1. Lumbar radiculopathy      Meds Administered:  Meds ordered this encounter  Medications  . betamethasone acetate-betamethasone sodium phosphate (CELESTONE) injection 12 mg     Laterality: Left  Location/Site:  L5-S1  Needle:3.5 in., 20 ga. Tuohy  Needle Placement: Paramedian epidural  Findings:   -Comments: Excellent flow of contrast into the epidural space.  Procedure Details: Using a paramedian approach from the side mentioned above, the region overlying the inferior lamina was localized under fluoroscopic visualization and the soft tissues overlying this structure were infiltrated with 4 ml. of 1% Lidocaine without Epinephrine. The Tuohy needle was inserted into the epidural space using a paramedian approach.   The epidural space was localized using loss of resistance along with counter oblique bi-planar fluoroscopic views.  After negative aspirate for air, blood, and CSF, a 2 ml. volume of Isovue-250 was injected into the epidural space and the flow of contrast was observed. Radiographs were obtained for documentation purposes.    The injectate was administered into the level noted above.   Additional Comments:  The patient tolerated the procedure well Dressing: 2 x 2 sterile gauze and Band-Aid    Post-procedure details: Patient was observed during the procedure. Post-procedure instructions were reviewed.  Patient left the  clinic in stable condition.

## 2020-06-01 NOTE — Progress Notes (Signed)
[name] - 86 y.o. male MRN [id number]  Date of birth: [birth year]  Office Visit Note: Visit Date: 06/01/2020 PCP: Denita Lung, MD Referred by: Denita Lung, MD  Subjective: Chief Complaint  Patient presents with  . Lower Back - Pain  . Middle Back - Pain  . Neck - Pain   HPI:  [name] is a 86 y.o. male who comes in today For evaluation management of chronic worsening severe mostly axial low back pain with some referral into the hips.  His notes can be reviewed in the charts and he does continue to see Dr. Eduard Roux from an orthopedic standpoint.  He is also followed by his primary care physician Dr. Jill Alexanders.  The last time I saw him was in July 2020 and we completed epidural injection.  He has had intermittent epidural injections with good relief of symptoms.  CT scan from prior to that shows moderate multifactorial stenosis at L4-5 with spondylosis multilevel above and below this level.  No high-grade stenosis at least on CT scan.  He reports injection really helped up until really the last several months.  He reports about 3 months or so just worsening back pain.  He had one episode on the left leg where he had some paresthesia but it was very acute in setting and he just felt weak in the left leg but this returned to normal after resting and sitting down for a while.  He had no return episodes of this.  He reports worsening with standing and movement.  Overall pain level 2 out of 10 but it does really limit his daily activities.  He continues to do core strengthening and exercises that we have taught him in the past.  He has had physical therapy in the past.  He continues with cycling and cardiovascular exercise.  He does continue to try to play golf and is able to swing a golf club.  He has had no focal weakness no bowel bladder difficulties.  No unexplained weight loss.  No trauma.  Review of Systems  Musculoskeletal: Positive for back pain.  All other systems  reviewed and are negative.  Otherwise per HPI.  Assessment & Plan: Visit Diagnoses:  1. Lumbar radiculopathy   2. Spinal stenosis of lumbar region with neurogenic claudication   3. Spondylosis without myelopathy or radiculopathy, lumbar region   4. Chronic bilateral low back pain without sciatica     Plan: Findings:  Chronic history of back pain with more of pain that is a nuisance to his ability to still do what he like to do 86 years old.  He still very active he continues to do core strengthening and cardiovascular exercise.  Worsening symptoms over the last several months despite conservative care with medications and activity modification and exercise.  No red flag complaints.  History of epidural injection doing quite well.  Based on the fact that the stenosis of the CT scan was at least moderate 3 years ago we are going to complete L5-S1 interlaminar injection today diagnostically and hopefully therapeutically.  He does not get much relief with that we would look at diagnostic medial branch blocks versus updated imaging versus regrouping with physical therapy.    Meds & Orders:  Meds ordered this encounter  Medications  . betamethasone acetate-betamethasone sodium phosphate (CELESTONE) injection 12 mg    Orders Placed This Encounter  Procedures  . XR C-ARM NO REPORT  . Epidural Steroid injection  Follow-up: Return if symptoms worsen or fail to improve.   Procedures: No procedures performed  Lumbar Epidural Steroid Injection - Interlaminar Approach with Fluoroscopic Guidance  Patient: [name]      Date of Birth: 1932-07-15 MRN: [id number] PCP: Denita Lung, MD      Visit Date: 06/01/2020   Universal Protocol:     Consent Given By: the patient  Position: PRONE  Additional Comments: Vital signs were monitored before and after the procedure. Patient was prepped and draped in the usual sterile fashion. The correct patient, procedure, and site was  verified.   Injection Procedure Details:   Procedure diagnoses:  1. Lumbar radiculopathy      Meds Administered:  Meds ordered this encounter  Medications  . betamethasone acetate-betamethasone sodium phosphate (CELESTONE) injection 12 mg     Laterality: Left  Location/Site:  L5-S1  Needle:3.5 in., 20 ga. Tuohy  Needle Placement: Paramedian epidural  Findings:   -Comments: Excellent flow of contrast into the epidural space.  Procedure Details: Using a paramedian approach from the side mentioned above, the region overlying the inferior lamina was localized under fluoroscopic visualization and the soft tissues overlying this structure were infiltrated with 4 ml. of 1% Lidocaine without Epinephrine. The Tuohy needle was inserted into the epidural space using a paramedian approach.   The epidural space was localized using loss of resistance along with counter oblique bi-planar fluoroscopic views.  After negative aspirate for air, blood, and CSF, a 2 ml. volume of Isovue-250 was injected into the epidural space and the flow of contrast was observed. Radiographs were obtained for documentation purposes.    The injectate was administered into the level noted above.   Additional Comments:  The patient tolerated the procedure well Dressing: 2 x 2 sterile gauze and Band-Aid    Post-procedure details: Patient was observed during the procedure. Post-procedure instructions were reviewed.  Patient left the clinic in stable condition.     Clinical History: CT LUMBAR SPINE WITHOUT CONTRAST  TECHNIQUE: Multidetector CT imaging of the lumbar spine was performed without intravenous contrast administration. Multiplanar CT image reconstructions were also generated.  COMPARISON:  Lumbar spine radiographs 02/01/2018  FINDINGS: Segmentation: Normal  Alignment: Mild retrolisthesis L1-2, L2-3, L3-4. Mild anterolisthesis L4-5 approximately 4 mm. 5 mm retrolisthesis L5-S1. Mild  lumbar scoliosis.  Vertebrae: Negative for fracture or mass  Paraspinal and other soft tissues: Heavily calcified aorta and iliac arteries without aneurysm. No retroperitoneal adenopathy or mass  Disc levels: T11-12: Bilateral facet degeneration. Negative for stenosis  L1-2: Advanced disc degeneration with disc space narrowing and mild spurring. Mild facet degeneration. Moderate right foraminal stenosis due to spurring  L1-2: Advanced disc degeneration with disc space narrowing. Diffuse disc bulging. Moderate right foraminal encroachment due to vertebral endplate spurring and facet hypertrophy.  L2-3: Advanced disc degeneration with fusion of the disc space on the right. Mild foraminal stenosis bilaterally.  L3-4: Disc degeneration and spondylosis. Bilateral facet hypertrophy. Severe left foraminal encroachment and mild right foraminal encroachment due to spurring  L4-5: 4 mm anterolisthesis with advanced disc degeneration. Advanced facet hypertrophy bilaterally. Moderate central canal stenosis. Severe subarticular stenosis bilaterally with severe left foraminal encroachment.  L5-S1: 5 mm retrolisthesis with advanced disc degeneration. Diffuse endplate spurring and mild facet hypertrophy. Left-sided facet degeneration and spurring projecting into the spinal canal and likely affecting the left L5 nerve root. Moderate subarticular stenosis and foraminal stenosis bilaterally due to spurring  IMPRESSION: Advanced multilevel degenerative change throughout the lumbar spine with extensive  disc space narrowing and spurring. Multilevel foraminal encroachment due to spurring as described above. Moderate central canal stenosis at L4-5.  Atherosclerotic aorta   Electronically Signed   By: Franchot Gallo M.D.   On: 02/19/2018 13:32     Objective:  VS:  HT:    WT:   BMI:     BP:(!) 164/73  HR:69bpm  TEMP: ( )  RESP:  Physical Exam Constitutional:       General: He is not in acute distress.    Appearance: Normal appearance. He is not ill-appearing.  HENT:     Head: Normocephalic and atraumatic.     Right Ear: External ear normal.     Left Ear: External ear normal.  Eyes:     Extraocular Movements: Extraocular movements intact.  Cardiovascular:     Rate and Rhythm: Normal rate.     Pulses: Normal pulses.  Abdominal:     General: There is no distension.     Palpations: Abdomen is soft.  Musculoskeletal:        General: No tenderness or signs of injury.     Right lower leg: No edema.     Left lower leg: No edema.     Comments: Patient has good distal strength without clonus.Patient somewhat slow to rise from a seated position to full extension.  There is concordant low back pain with facet loading and lumbar spine extension rotation.  There are no definitive trigger points but the patient is somewhat tender across the lower back and PSIS.  There is no pain with hip rotation.   Skin:    Findings: No erythema or rash.  Neurological:     General: No focal deficit present.     Mental Status: He is alert and oriented to person, place, and time.     Sensory: No sensory deficit.     Motor: No weakness or abnormal muscle tone.     Coordination: Coordination normal.  Psychiatric:        Mood and Affect: Mood normal.        Behavior: Behavior normal.      Imaging: XR C-ARM NO REPORT  Result Date: 06/01/2020 Please see Notes tab for imaging impression.

## 2020-06-13 IMAGING — CT CT L SPINE W/O CM
2 of 7 series · 5 of 14 positions shown, 6 images · non-contrast
Comparison: Lumbar spine radiographs 02/01/2018

CLINICAL DATA: Lumbar disc degeneration.  Back pain

EXAM:
CT LUMBAR SPINE WITHOUT CONTRAST
TECHNIQUE: Multidetector CT imaging of the lumbar spine was performed without
intravenous contrast administration. Multiplanar CT image
reconstructions were also generated.

[Series 3: l spine soft · axial · 0.33mm/px · z∈[+889,+979]mm · 2 of 90 slices shown (1 of 2)]
[im 30/90  soft-tissue]
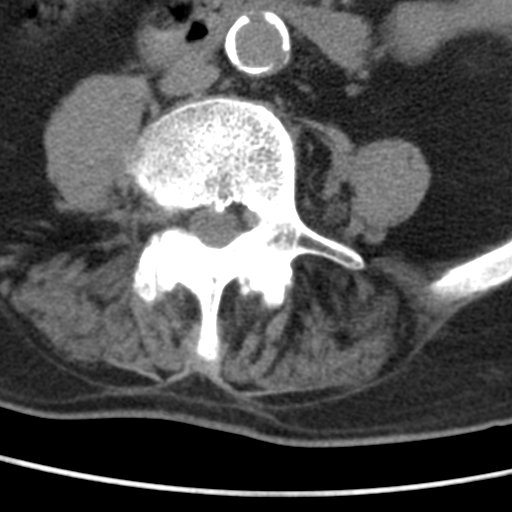
[im 60/90  soft-tissue]
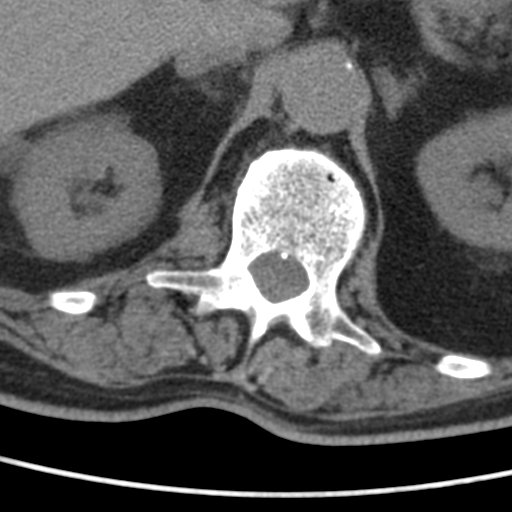

[Series 4: l spine soft · axial · 0.33mm/px · z∈[+868,+1000]mm · 3 of 90 slices shown, 4 images (2 of 2)]
[im 23/90  soft-tissue]
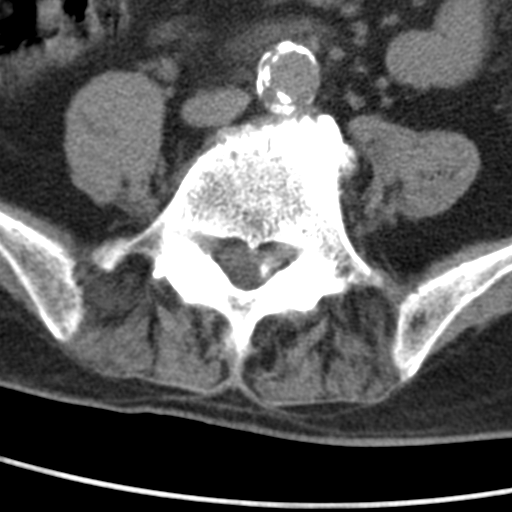
[im 23/90  bone]
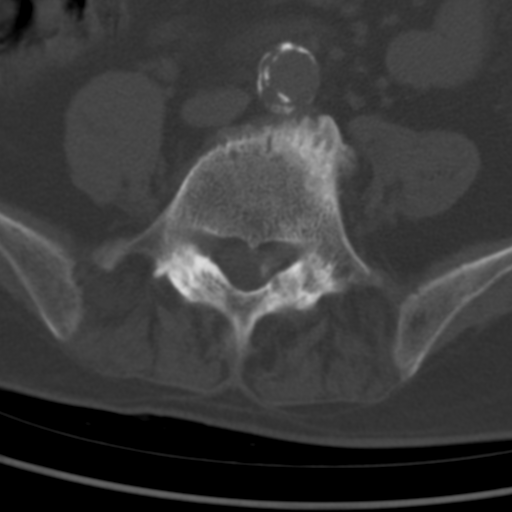
[im 45/90  bone]
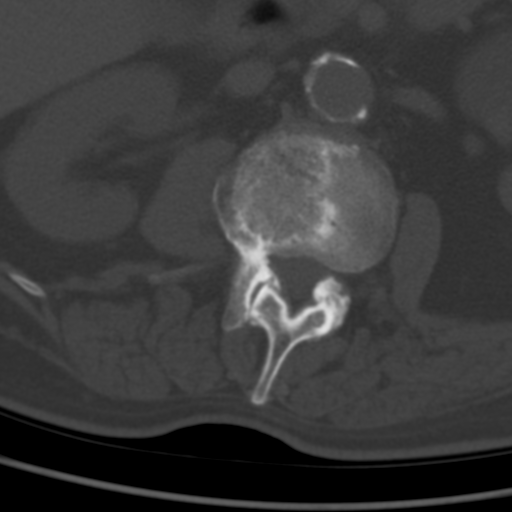
[im 67/90  bone]
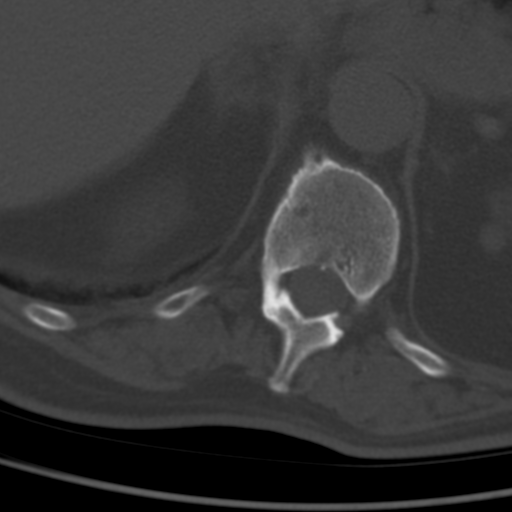

[5 of 14 positions shown; findings below may reference images not displayed]

FINDINGS: Segmentation: Normal

Alignment: Mild retrolisthesis L1-2, L2-3, L3-4. Mild
anterolisthesis L4-5 approximately 4 mm. 5 mm retrolisthesis L5-S1.
Mild lumbar scoliosis.

Vertebrae: Negative for fracture or mass

Paraspinal and other soft tissues: Heavily calcified aorta and iliac
arteries without aneurysm. No retroperitoneal adenopathy or mass

Disc levels: T11-12: Bilateral facet degeneration. Negative for
stenosis

L1-2: Advanced disc degeneration with disc space narrowing and mild
spurring. Mild facet degeneration. Moderate right foraminal stenosis
due to spurring

L1-2: Advanced disc degeneration with disc space narrowing. Diffuse
disc bulging. Moderate right foraminal encroachment due to vertebral
endplate spurring and facet hypertrophy.

L2-3: Advanced disc degeneration with fusion of the disc space on
the right. Mild foraminal stenosis bilaterally.

L3-4: Disc degeneration and spondylosis. Bilateral facet
hypertrophy. Severe left foraminal encroachment and mild right
foraminal encroachment due to spurring

L4-5: 4 mm anterolisthesis with advanced disc degeneration. Advanced
facet hypertrophy bilaterally. Moderate central canal stenosis.
Severe subarticular stenosis bilaterally with severe left foraminal
encroachment.

L5-S1: 5 mm retrolisthesis with advanced disc degeneration. Diffuse
endplate spurring and mild facet hypertrophy. Left-sided facet
degeneration and spurring projecting into the spinal canal and
likely affecting the left L5 nerve root. Moderate subarticular
stenosis and foraminal stenosis bilaterally due to spurring
IMPRESSION: Advanced multilevel degenerative change throughout the lumbar spine
with extensive disc space narrowing and spurring. Multilevel
foraminal encroachment due to spurring as described above. Moderate
central canal stenosis at L4-5.

Atherosclerotic aorta

## 2020-07-14 ENCOUNTER — Ambulatory Visit: Payer: Medicare Other | Admitting: Physician Assistant

## 2020-07-14 ENCOUNTER — Encounter: Payer: Self-pay | Admitting: Physician Assistant

## 2020-07-14 VITALS — BP 127/70 | HR 79 | Ht 65.0 in | Wt 152.0 lb

## 2020-07-14 DIAGNOSIS — R140 Abdominal distension (gaseous): Secondary | ICD-10-CM

## 2020-07-14 DIAGNOSIS — K581 Irritable bowel syndrome with constipation: Secondary | ICD-10-CM

## 2020-07-14 NOTE — Progress Notes (Signed)
Chief Complaint: IBS-C and bloating  HPI:    [name] is an 86 year old male with a past medical history of GERD and others listed below, known to Dr. Henrene Pastor, who was referred to me by Denita Lung, MD for a complaint of IBS-C and bloating.      06/15/2018 patient seen in clinic by Dr. Henrene Pastor.  At that time described problems with flatulence that occur particularly afternoon after lunch.  Apparently had been seen by one of the PAs June 2019, trial of Xifaxan was not helpful for more than 2 days.  Had also tried VSL 3 as well as [place] without significant change, problem seemingly started after complications with regards to pacemaker, labs were normal.  Extensive education was given regarding intestinal gas.  He was screened for celiac disease.  Restoral probiotic given.    04/30/2020 CMP with alk phos 123.  CBC normal.    04/30/2020 saw his PCP at that time constipation was well controlled with MiraLAX and reflux is well controlled.    Today, the patient presents to clinic and tells me that he initially had problems with constipation after a prostatectomy 21 years ago and started Metamucil daily which worked well for him for many years.  Then in 2018 he had a pacemaker placed and had some complications from this and developed gas and bloating and pain and had to add in MiraLAX daily.  Tells me now that he has a very specific routine and in the morning eats a bowl of oatmeal with dried blueberries and typically has a bowel movement and feels fine thereafter.  Also ensures he drinks at least 64 ounces of water a day.  His problem comes typically after eating lunch.  He will have something for lunch that is on the low FODMAP diet as he tries to follow this and will take a nap around 2, but when he lays down to sleep he has a buildup of gas that makes him feel bloated and distended.  (Recently he has been sitting in his recliner instead and this seems to help some) he typically takes MiraLAX at 3 pm and  then things seem to calm down thereafter and he is able to eat dinner, sometimes develops gas but never gets the bloating.  Sometimes has a small bowel movement in the evening.  His main concern is the bloating and discomfort in the afternoons.  Apparently this has affected his golf game.  Has tried all the probiotics in the world but nothing seems to work for long.  Also saw dietitian, but this did not help either.     Denies fever, chills, change in bowel habits or blood in his stool.  Past Medical History:  Diagnosis Date  . 1st degree AV block   . Arthritis    "hands, back, neck, shoulders, knees" (01/11/2017)  . Eczema   . GERD (gastroesophageal reflux disease)   . HH (hiatus hernia)   . History of blood transfusion    "maybe; w/prostate OR; they would have used my own" (01/11/2017)  . History of colonic polyps    "I don't think I've had any polyps" (01/11/2017)  . Hypercholesterolemia   . Hypertension   . Presence of permanent cardiac pacemaker   . Prostate cancer ([place]) 1997  . Seasonal allergies   . SVT (supraventricular tachycardia) ([facility])     Past Surgical History:  Procedure Laterality Date  . AV NODE ABLATION  2000s X 2   AVNODE REENTRANT ABLATION  .  CATARACT EXTRACTION W/ INTRAOCULAR LENS  IMPLANT, BILATERAL    . COLONOSCOPY  2007   MAGOD  . INSERT / REPLACE / REMOVE PACEMAKER  01/11/2017  . PACEMAKER IMPLANT N/A 01/11/2017   Procedure: Pacemaker Implant;  Surgeon: Evans Lance, MD;  Location: [facility];  Service: Cardiovascular;  Laterality: N/A;  . PROSTATECTOMY    . SKIN BIOPSY Right 02/04/2019   verruca vulgaris, irritated  . TESTICLE SURGERY Left 2010   "benign growth"; [facility]  . TONSILLECTOMY      Current Outpatient Medications  Medication Sig Dispense Refill  . acetaminophen (TYLENOL) 650 MG CR tablet Take 650 mg by mouth daily as needed for pain.    Marland Kitchen alclomethasone (ACLOVATE) 0.05 % cream Apply topically 2 (two) times daily. 30 g 3  .  amLODipine (NORVASC) 5 MG tablet TAKE 1 TABLET BY MOUTH  DAILY 90 tablet 3  . aspirin 81 MG tablet Take 162 mg by mouth daily.     . cetirizine (ZYRTEC) 10 MG tablet Take 10 mg by mouth at bedtime.     Marland Kitchen doxycycline (VIBRA-TABS) 100 MG tablet Take 1 tablet (100 mg total) by mouth 2 (two) times daily. 28 tablet 0  . fluticasone (FLONASE) 50 MCG/ACT nasal spray USE 2 SPRAYS IN EACH  NOSTRIL DAILY 48 g 3  . hydrochlorothiazide (HYDRODIURIL) 12.5 MG tablet Take 1 tablet (12.5 mg total) by mouth daily. 90 tablet 3  . ibuprofen (ADVIL,MOTRIN) 200 MG tablet Take 400 mg by mouth daily as needed (pain).    Marland Kitchen omeprazole (PRILOSEC) 20 MG capsule TAKE 1 CAPSULE BY MOUTH  TWICE DAILY BEFORE MEALS 180 capsule 3  . pravastatin (PRAVACHOL) 40 MG tablet Take 1 tablet (40 mg total) by mouth daily. 90 tablet 3  . sulfamethoxazole-trimethoprim (BACTRIM DS) 800-160 MG tablet Take 1 tablet by mouth 2 (two) times daily. 14 tablet 0  . triamcinolone cream (KENALOG) 0.1 % Apply topically 2 (two) times daily. 45 g 1   No current facility-administered medications for this visit.    Allergies as of 07/14/2020 - Review Complete 06/01/2020  Allergen Reaction Noted  . Codeine Nausea And Vomiting     Family History  Problem Relation Age of Onset  . Cancer Mother   . Alcoholism Father     Social History   Socioeconomic History  . Marital status: Married    Spouse name: Not on file  . Number of children: Not on file  . Years of education: Not on file  . Highest education level: Not on file  Occupational History  . Not on file  Tobacco Use  . Smoking status: Former Smoker    Packs/day: 1.50    Years: 13.00    Pack years: 19.50    Types: Cigarettes    Quit date: 08/25/1962    Years since quitting: 57.9  . Smokeless tobacco: Never Used  Vaping Use  . Vaping Use: Never used  Substance and Sexual Activity  . Alcohol use: No  . Drug use: No  . Sexual activity: Never  Other Topics Concern  . Not on file   Social History Narrative   Originally from [place]   Social Determinants of Health   Financial Resource Strain: Not on file  Food Insecurity: Not on file  Transportation Needs: Not on file  Physical Activity: Not on file  Stress: Not on file  Social Connections: Not on file  Intimate Partner Violence: Not on file    Review of Systems:  Constitutional: No weight loss, fever or chills Cardiovascular: No chest pain  Respiratory: No SOB  Gastrointestinal: See HPI and otherwise negative   Physical Exam:  Vital signs: BP 127/70   Pulse 79   Ht '5\' 5"'  (1.651 m)   Wt 152 lb (68.9 kg)   SpO2 98%   BMI 25.29 kg/m   Constitutional:   Pleasant Caucasian male appears to be in NAD, Well developed, Well nourished, alert and cooperative Respiratory: Respirations even and unlabored. Lungs clear to auscultation bilaterally.   No wheezes, crackles, or rhonchi.  Cardiovascular: Normal S1, S2. No MRG. Regular rate and rhythm. No peripheral edema, cyanosis or pallor.  Gastrointestinal:  Soft, nondistended, nontender. No rebound or guarding. Normal bowel sounds. No appreciable masses or hepatomegaly. Rectal:  Not performed.  Psychiatric: Oriented to person, place and time. Demonstrates good judgement and reason without abnormal affect or behaviors.  RELEVANT LABS AND IMAGING: CBC    Component Value Date/Time   WBC 7.2 04/30/2020 1032   WBC 7.1 04/21/2017 0949   RBC 5.47 04/30/2020 1032   RBC 5.62 04/21/2017 0949   HGB 16.3 04/30/2020 1032   HCT 49.1 04/30/2020 1032   PLT 200 04/30/2020 1032   MCV 90 04/30/2020 1032   MCH 29.8 04/30/2020 1032   MCH 28.6 04/21/2017 0949   MCHC 33.2 04/30/2020 1032   MCHC 32.9 04/21/2017 0949   RDW 12.0 04/30/2020 1032   LYMPHSABS 0.6 (L) 04/30/2020 1032   MONOABS 665 11/17/2015 0001   EOSABS 0.4 04/30/2020 1032   BASOSABS 0.1 04/30/2020 1032    CMP     Component Value Date/Time   NA 138 04/30/2020 1032   K 3.8 04/30/2020 1032   CL 95 (L)  04/30/2020 1032   CO2 26 04/30/2020 1032   GLUCOSE 71 04/30/2020 1032   GLUCOSE 100 (H) 04/21/2017 0949   BUN 14 04/30/2020 1032   CREATININE 1.08 04/30/2020 1032   CREATININE 0.98 04/21/2017 0949   CALCIUM 10.4 (H) 04/30/2020 1032   PROT 7.8 04/30/2020 1032   ALBUMIN 5.1 (H) 04/30/2020 1032   AST 25 04/30/2020 1032   ALT 21 04/30/2020 1032   ALKPHOS 123 (H) 04/30/2020 1032   BILITOT 0.4 04/30/2020 1032   GFRNONAA 62 04/30/2020 1032   GFRAA 71 04/30/2020 1032    Assessment: 1.  IBS-C: With bloating, worse in afternoon which causes distention and discomfort, tried probiotics, Xifaxan and other methods which have not helped  Plan: 1.  First recommended trial of Gas-X prior to eating lunch.  He should try this for at least a week or 2.  If this is not helping then could try moving his MiraLAX dose to before lunch.  If this does not help then he will call and let us know and we can try him on Linzess 72 mcg daily, 30 minutes before breakfast.  He would need to trial this for at least 4 to 6 weeks to see if it helps him. 2.  Patient to follow in clinic with me as needed.  Ellouise Newer, PA-C [facility] 07/14/2020, 9:49 AM  Cc: Denita Lung, MD

## 2020-07-14 NOTE — Patient Instructions (Addendum)
If you are age 84 or older, your body mass index should be between 23-30. Your Body mass index is 25.29 kg/m. If this is out of the aforementioned range listed, please consider follow up with your Primary Care Provider.  If you are age 9 or younger, your body mass index should be between 19-25. Your Body mass index is 25.29 kg/m. If this is out of the aformentioned range listed, please consider follow up with your Primary Care Provider.   Please start Gas X before lunch over the counter and if that does not work try moving Miralax before lunch   Call back in 2 weeks and let us know how you are doing.  Thank you for choosing me and [facility].  Ellouise Newer, PA-C

## 2020-07-14 NOTE — Progress Notes (Signed)
Noted  

## 2020-07-22 ENCOUNTER — Ambulatory Visit (INDEPENDENT_AMBULATORY_CARE_PROVIDER_SITE_OTHER): Payer: Medicare Other

## 2020-07-22 DIAGNOSIS — I442 Atrioventricular block, complete: Secondary | ICD-10-CM

## 2020-07-22 LAB — CUP PACEART REMOTE DEVICE CHECK
Battery Remaining Longevity: 87 mo
Battery Voltage: 2.98 V
Brady Statistic AP VP Percent: 59.28 %
Brady Statistic AP VS Percent: 0.03 %
Brady Statistic AS VP Percent: 39.09 %
Brady Statistic AS VS Percent: 1.6 %
Brady Statistic RA Percent Paced: 59 %
Brady Statistic RV Percent Paced: 98.37 %
Date Time Interrogation Session: 20211228225521
Implantable Lead Implant Date: 20180620
Implantable Lead Implant Date: 20180620
Implantable Lead Location: 753859
Implantable Lead Location: 753860
Implantable Lead Model: 5076
Implantable Lead Model: 5076
Implantable Pulse Generator Implant Date: 20180620
Lead Channel Impedance Value: 304 Ohm
Lead Channel Impedance Value: 342 Ohm
Lead Channel Impedance Value: 380 Ohm
Lead Channel Impedance Value: 380 Ohm
Lead Channel Pacing Threshold Amplitude: 0.5 V
Lead Channel Pacing Threshold Amplitude: 0.75 V
Lead Channel Pacing Threshold Pulse Width: 0.4 ms
Lead Channel Pacing Threshold Pulse Width: 0.4 ms
Lead Channel Sensing Intrinsic Amplitude: 2.625 mV
Lead Channel Sensing Intrinsic Amplitude: 2.625 mV
Lead Channel Sensing Intrinsic Amplitude: 6.375 mV
Lead Channel Sensing Intrinsic Amplitude: 6.375 mV
Lead Channel Setting Pacing Amplitude: 1.5 V
Lead Channel Setting Pacing Amplitude: 2.5 V
Lead Channel Setting Pacing Pulse Width: 0.4 ms
Lead Channel Setting Sensing Sensitivity: 2 mV

## 2020-07-27 ENCOUNTER — Telehealth: Payer: Self-pay | Admitting: Physician Assistant

## 2020-07-27 NOTE — Telephone Encounter (Signed)
Pt called asking to speak with Victorino Dike. He did not want to disclose more information. He is aware that she is at the hospital all week and may not receive a call until next week. He was ok with that.

## 2020-07-27 NOTE — Telephone Encounter (Signed)
Telephone call: 07/27/20 11:00am  Spoke with patient in regards to his irritable bowel symptoms.  Apparently he read online that "oatmeal can stick around a long time in your digestive system", and he has been eating oatmeal for years of his life.  Tells me that he stopped eating it about 5 days ago and since then has had no further issues with afternoon bloating and abdominal discomfort.  He has started to eat rice crispies or something else in the morning which seems to go better for him.  Does tell me he started to get constipated but this is now better with daily MiraLAX which he plans to continue.  He is very happy with this current regimen and will call if anything changes.  Hyacinth Meeker, PA-C

## 2020-08-04 NOTE — Progress Notes (Signed)
Remote pacemaker transmission.   

## 2020-08-09 ENCOUNTER — Other Ambulatory Visit: Payer: Self-pay | Admitting: Family Medicine

## 2020-08-09 DIAGNOSIS — E785 Hyperlipidemia, unspecified: Secondary | ICD-10-CM

## 2020-08-10 NOTE — Telephone Encounter (Signed)
Please advise if a year supply is ok. Pt already has appt scheduled for later in the year. [employer]

## 2020-08-19 ENCOUNTER — Telehealth: Payer: Self-pay | Admitting: Family Medicine

## 2020-08-19 ENCOUNTER — Other Ambulatory Visit: Payer: Self-pay

## 2020-08-19 DIAGNOSIS — I10 Essential (primary) hypertension: Secondary | ICD-10-CM

## 2020-08-19 MED ORDER — HYDROCHLOROTHIAZIDE 12.5 MG PO TABS: 12.5000 mg | ORAL_TABLET | Freq: Every day | ORAL | 3 refills | Status: DC

## 2020-08-19 NOTE — Telephone Encounter (Signed)
[facility] rx req Hydrochlorothiaz tabs

## 2020-08-19 NOTE — Telephone Encounter (Signed)
Done KH 

## 2020-08-25 DIAGNOSIS — L400 Psoriasis vulgaris: Secondary | ICD-10-CM | POA: Diagnosis not present

## 2020-09-02 ENCOUNTER — Telehealth: Payer: Self-pay | Admitting: Physician Assistant

## 2020-09-02 NOTE — Telephone Encounter (Signed)
Telephone Call  Experiencing some gas and bloating with mild generalized abdominal pain. Sometimes in afternoon has a small amount of stool when passing gas and after doing this feels completely relieved.  Currently he is using MiraLAX around 3 PM and eating a high-fiber cereal for breakfast.  Tends to have good bowel movements in the morning and then will sometimes pass a small amount of stool after discomfort in the afternoon.  Recommend the patient increase his MiraLAX to twice daily, taking a dose right away in the morning with his first 8 ounces of water and then again in the afternoon, he should do this for at least a week to see how it is working.  Or, patient mentions later in the conversation he would like to try probiotic.  Explained that he could try Align once daily for at least 2 months, would give this at least 3 to 4 weeks to see if it has any alteration in his current pattern.  Explained that he may not want to try both of these things at the same time because he will not know what is working.  He thanked me for my suggestions.  Ellouise Newer, PA-C

## 2020-09-02 NOTE — Telephone Encounter (Signed)
Spoke with patient, he states that he would like to speak with Anderson Malta, he states that he had some thoughts that he would like to run by her. He did not disclose any further information. Patient has been advised that Anderson Malta is covering the hospital this week but I will get the message to her.

## 2020-09-04 ENCOUNTER — Telehealth: Payer: Self-pay

## 2020-09-04 NOTE — Telephone Encounter (Signed)
Pt. Called wanting to know if you were here today. Stated he doesn't need an apt, just wanted some suggestions on what he could do. He had a nose bleed on the rt. Side only last night around 7 pm. He said he always has a runny nose and slight cough due to allergies.

## 2020-09-04 NOTE — Telephone Encounter (Signed)
If its one time, I would not be overly worried, if it continues,tip the head forward and pinch the nose for 5 minutes,That usually works

## 2020-09-04 NOTE — Telephone Encounter (Signed)
Pt. Aware of recommendations.

## 2020-09-07 ENCOUNTER — Telehealth (INDEPENDENT_AMBULATORY_CARE_PROVIDER_SITE_OTHER): Payer: Medicare Other | Admitting: Family Medicine

## 2020-09-07 ENCOUNTER — Encounter: Payer: Self-pay | Admitting: Family Medicine

## 2020-09-07 VITALS — Temp 97.8°F | Wt 152.0 lb

## 2020-09-07 DIAGNOSIS — J309 Allergic rhinitis, unspecified: Secondary | ICD-10-CM

## 2020-09-07 DIAGNOSIS — R040 Epistaxis: Secondary | ICD-10-CM | POA: Diagnosis not present

## 2020-09-07 NOTE — Progress Notes (Signed)
   Subjective:    Patient ID: [name], male    DOB: July 24, 1932, 87 y.o.   MRN: [id number]  HPI Documentation for virtual audio telecommunications through [place] encounter: The patient was located at home. 2 patient identifiers used.  The provider was located in the office. The patient did consent to this visit and is aware of possible charges through their insurance for this visit. The other persons participating in this telemedicine service were none. Time spent on call was 5 minutes and in review of previous records >20 minutes total for counseling and coordination of care. This virtual service is not related to other E/M service within previous 7 days. He states that he had a right-sided nosebleed last Thursday and again 1 yesterday.  He treated this appropriately by compression.  He has not noted bleeding anywhere else.  He does have underlying allergies and does use Nasacort as well as a Nettie pot.  He has been doing it for quite some time.  Both of the nosebleeds were on the right-hand side.  He notes no other areas of bleeding.  He has concerns over anything else that might need to be done.  Review of Systems     Objective:   Physical Exam Alert and in no distress otherwise not examined       Assessment & Plan:  Epistaxis  Allergic rhinitis, unspecified seasonality, unspecified trigger I explained that he is treating this appropriately with compression.  Explained that if this continues, he should come in and and we might need to look for a vessel that might need to be cauterized.  He was comfortable with that.

## 2020-09-08 ENCOUNTER — Encounter: Payer: Self-pay | Admitting: Family Medicine

## 2020-09-08 ENCOUNTER — Other Ambulatory Visit: Payer: Self-pay

## 2020-09-08 ENCOUNTER — Telehealth: Payer: Self-pay | Admitting: Family Medicine

## 2020-09-08 ENCOUNTER — Telehealth: Payer: Self-pay

## 2020-09-08 ENCOUNTER — Ambulatory Visit (INDEPENDENT_AMBULATORY_CARE_PROVIDER_SITE_OTHER): Payer: Medicare Other | Admitting: Family Medicine

## 2020-09-08 VITALS — BP 152/82 | HR 60 | Temp 96.8°F

## 2020-09-08 DIAGNOSIS — R040 Epistaxis: Secondary | ICD-10-CM | POA: Diagnosis not present

## 2020-09-08 NOTE — Progress Notes (Signed)
   Subjective:    Patient ID: [name], male    DOB: [birth year], 87 y.o.   MRN: [id number]  HPI He is here for consult concerning nosebleed mainly on the right.  He has had several days over the last week or so.  He was given instructions on how to handle them but states today that he had continued difficulty with bleeding on the right side in spite of compression.  No other episodes of bleeding from the mouth gums are excessive bruising.   Review of Systems     Objective:   Physical Exam The nose was packed with a cotton ball soaked with epinephrine and Xylocaine.  Exam of the right nares showed no active bleeding, slight medial erythema but no actual vessel was seen.       Assessment & Plan:  Epistaxis, recurrent - Plan: Ambulatory referral to ENT This might be a little bit more posterior that I could see so I think ENT evaluation is appropriate.  I do not think this is a posterior bleed since it was only on one side.

## 2020-09-08 NOTE — Telephone Encounter (Signed)
Pt called and wanted to check the status of his ENT referral. I explained to pt that Maudie Mercury was working on this and will be in touch soon

## 2020-09-08 NOTE — Telephone Encounter (Signed)
Pt. Called stating that he is still having a lot of trouble with his nose bleeding on the rt. Side of his nose. He said that you told him to call back and you could cauterize it. He wanted to know if he could come in this morning and I told him you are full this morning but I did hold a spot for him at 3 p.m. this afternoon. He still insisted to ask you if you could see him this morning because he couldn't get it to stop bleeding.

## 2020-09-10 DIAGNOSIS — R040 Epistaxis: Secondary | ICD-10-CM | POA: Diagnosis not present

## 2020-10-09 DIAGNOSIS — R040 Epistaxis: Secondary | ICD-10-CM | POA: Diagnosis not present

## 2020-10-09 DIAGNOSIS — J310 Chronic rhinitis: Secondary | ICD-10-CM | POA: Diagnosis not present

## 2020-10-12 ENCOUNTER — Telehealth: Payer: Self-pay | Admitting: Family Medicine

## 2020-10-12 NOTE — Telephone Encounter (Signed)
Lvm for pt again. Pena

## 2020-10-12 NOTE — Telephone Encounter (Signed)
LVM for pt . KH 

## 2020-10-12 NOTE — Telephone Encounter (Signed)
Pt called and states that Zyrtec is no longer working for him. He would like a recommendation for something else. Not a nasal spray. Pt can be reached at [phone].

## 2020-10-12 NOTE — Telephone Encounter (Signed)
Have him try Allegra or Claritin.

## 2020-10-13 ENCOUNTER — Ambulatory Visit: Payer: Medicare Other | Admitting: Orthopaedic Surgery

## 2020-10-15 NOTE — Telephone Encounter (Signed)
Pt advised that he just got claritin. He will give it a week. kh

## 2020-10-19 ENCOUNTER — Other Ambulatory Visit: Payer: Self-pay | Admitting: Family Medicine

## 2020-10-19 DIAGNOSIS — I10 Essential (primary) hypertension: Secondary | ICD-10-CM

## 2020-10-20 ENCOUNTER — Other Ambulatory Visit: Payer: Self-pay

## 2020-10-20 ENCOUNTER — Ambulatory Visit: Payer: Medicare Other | Admitting: Orthopaedic Surgery

## 2020-10-20 ENCOUNTER — Encounter: Payer: Self-pay | Admitting: Orthopaedic Surgery

## 2020-10-20 VITALS — Ht 65.0 in | Wt 152.0 lb

## 2020-10-20 DIAGNOSIS — M170 Bilateral primary osteoarthritis of knee: Secondary | ICD-10-CM

## 2020-10-20 DIAGNOSIS — M1711 Unilateral primary osteoarthritis, right knee: Secondary | ICD-10-CM

## 2020-10-20 DIAGNOSIS — M1712 Unilateral primary osteoarthritis, left knee: Secondary | ICD-10-CM | POA: Diagnosis not present

## 2020-10-20 MED ORDER — LIDOCAINE HCL 1 % IJ SOLN
3.0000 mL | INTRAMUSCULAR | Status: AC | PRN
  Administered 2020-10-20: 3 mL

## 2020-10-20 MED ORDER — METHYLPREDNISOLONE ACETATE 40 MG/ML IJ SUSP
13.3300 mg | INTRAMUSCULAR | Status: AC | PRN
  Administered 2020-10-20: 13.33 mg via INTRA_ARTICULAR

## 2020-10-20 MED ORDER — BUPIVACAINE HCL 0.25 % IJ SOLN
0.6600 mL | INTRAMUSCULAR | Status: AC | PRN
  Administered 2020-10-20: .66 mL via INTRA_ARTICULAR

## 2020-10-20 NOTE — Progress Notes (Signed)
Office Visit Note   Patient: [name]           Date of Birth: 08-03-1931           MRN: [id number] Visit Date: 10/20/2020              Requested by: Denita Lung, MD [place],  [place] [address] PCP: Denita Lung, MD   Assessment & Plan: Visit Diagnoses:  1. Bilateral primary osteoarthritis of knee     Plan: Impression is bilateral knee degenerative joint disease right greater than left.  Today, we injected both knees with cortisone.  He will follow up with [place] as needed.  Follow-Up Instructions: Return if symptoms worsen or fail to improve.   Orders:  Orders Placed This Encounter  Procedures  . Large Joint Inj: bilateral knee   No orders of the defined types were placed in this encounter.     Procedures: Large Joint Inj: bilateral knee on 10/20/2020 10:19 AM Indications: pain Details: 22 G needle, anterolateral approach Medications (Right): 0.66 mL bupivacaine 0.25 %; 3 mL lidocaine 1 %; 13.33 mg methylPREDNISolone acetate 40 MG/ML Medications (Left): 0.66 mL bupivacaine 0.25 %; 3 mL lidocaine 1 %; 13.33 mg methylPREDNISolone acetate 40 MG/ML      Clinical Data: No additional findings.   Subjective: Chief Complaint  Patient presents with  . Right Knee - Follow-up  . Left Knee - Follow-up    HPI patient is a very pleasant 87 year old gentleman who comes in today with recurrent bilateral knee pain right greater than left.  He has a history of advanced degenerative joint disease to both knees.  He has been getting intermittent cortisone injections by [place] for a while.  His last injections were in July with good relief till recently.  The pain he has is worse 70.  He has recently purchased a knee sleeve which does seem to help.  He would like repeat cortisone injections today.  Review of Systems as detailed in HPI.  All others reviewed and are negative.   Objective: Vital Signs: Ht 5\' 5"  (1.651 m)   Wt 152 lb (68.9 kg)   BMI  25.29 kg/m   Physical Exam well-developed well-nourished gentleman in no acute distress.  Alert oriented x3.  Ortho Exam bilateral knee exam shows no effusion.  Range of motion 0 to 120 degrees.  No joint line tenderness.  Moderate patellofemoral crepitus.  Ligaments are stable.  He is neurovascular intact distally.  Specialty Comments:  No specialty comments available.  Imaging: No new imaging   PMFS History: Patient Active Problem List   Diagnosis Date Noted  . Senile purpura ([place]) 04/30/2020  . Stage 3a chronic kidney disease ([place] [place]) 04/30/2020  . Constipation 04/30/2020  . Primary osteoarthritis of left knee 10/03/2019  . Primary osteoarthritis of right knee 10/03/2019  . Gastroesophageal reflux disease 04/29/2019  . Pacemaker 04/21/2017  . Complete heart block ([facility]) 01/11/2017  . History of prostate cancer 12/27/2010  . Arthritis 12/27/2010  . History of cataract extraction 12/27/2010  . HH (hiatus hernia)   . History of colonic polyps   . Hyperlipidemia LDL goal <130 04/21/2008  . Allergic rhinitis 04/21/2008  . Essential hypertension 04/18/2008   Past Medical History:  Diagnosis Date  . 1st degree AV block   . Arthritis    "hands, back, neck, shoulders, knees" (01/11/2017)  . Eczema   . GERD (gastroesophageal reflux disease)   . HH (hiatus hernia)   . History  of blood transfusion    "maybe; w/prostate OR; they would have used my own" (01/11/2017)  . History of colonic polyps    "I don't think I've had any polyps" (01/11/2017)  . Hypercholesterolemia   . Hypertension   . Presence of permanent cardiac pacemaker   . Prostate cancer ([facility]) 1997  . Seasonal allergies   . SVT (supraventricular tachycardia) ([facility])     Family History  Problem Relation Age of Onset  . Cancer Mother   . Alcoholism Father     Past Surgical History:  Procedure Laterality Date  . AV NODE ABLATION  2000s X 2   AVNODE REENTRANT ABLATION  . CATARACT EXTRACTION W/ INTRAOCULAR LENS   IMPLANT, BILATERAL    . COLONOSCOPY  2007   MAGOD  . INSERT / REPLACE / REMOVE PACEMAKER  01/11/2017  . PACEMAKER IMPLANT N/A 01/11/2017   Procedure: Pacemaker Implant;  Surgeon: Evans Lance, MD;  Location: [facility];  Service: Cardiovascular;  Laterality: N/A;  . PROSTATECTOMY    . SKIN BIOPSY Right 02/04/2019   verruca vulgaris, irritated  . TESTICLE SURGERY Left 2010   "benign growth"; [facility]  . TONSILLECTOMY     Social History   Occupational History  . Occupation: retired  Tobacco Use  . Smoking status: Former Smoker    Packs/day: 1.50    Years: 13.00    Pack years: 19.50    Types: Cigarettes    Quit date: 08/25/1962    Years since quitting: 58.1  . Smokeless tobacco: Never Used  Vaping Use  . Vaping Use: Never used  Substance and Sexual Activity  . Alcohol use: No  . Drug use: No  . Sexual activity: Never

## 2020-10-21 ENCOUNTER — Ambulatory Visit (INDEPENDENT_AMBULATORY_CARE_PROVIDER_SITE_OTHER): Payer: Medicare Other

## 2020-10-21 ENCOUNTER — Telehealth: Payer: Self-pay | Admitting: Family Medicine

## 2020-10-21 DIAGNOSIS — I442 Atrioventricular block, complete: Secondary | ICD-10-CM | POA: Diagnosis not present

## 2020-10-21 LAB — CUP PACEART REMOTE DEVICE CHECK
Battery Remaining Longevity: 83 mo
Battery Voltage: 2.98 V
Brady Statistic AP VP Percent: 61.96 %
Brady Statistic AP VS Percent: 0.07 %
Brady Statistic AS VP Percent: 35.69 %
Brady Statistic AS VS Percent: 2.28 %
Brady Statistic RA Percent Paced: 61.87 %
Brady Statistic RV Percent Paced: 97.65 %
Date Time Interrogation Session: 20220329222623
Implantable Lead Implant Date: 20180620
Implantable Lead Implant Date: 20180620
Implantable Lead Location: 753859
Implantable Lead Location: 753860
Implantable Lead Model: 5076
Implantable Lead Model: 5076
Implantable Pulse Generator Implant Date: 20180620
Lead Channel Impedance Value: 285 Ohm
Lead Channel Impedance Value: 342 Ohm
Lead Channel Impedance Value: 380 Ohm
Lead Channel Impedance Value: 399 Ohm
Lead Channel Pacing Threshold Amplitude: 0.5 V
Lead Channel Pacing Threshold Amplitude: 0.875 V
Lead Channel Pacing Threshold Pulse Width: 0.4 ms
Lead Channel Pacing Threshold Pulse Width: 0.4 ms
Lead Channel Sensing Intrinsic Amplitude: 2.75 mV
Lead Channel Sensing Intrinsic Amplitude: 2.75 mV
Lead Channel Sensing Intrinsic Amplitude: 6 mV
Lead Channel Sensing Intrinsic Amplitude: 6 mV
Lead Channel Setting Pacing Amplitude: 1.5 V
Lead Channel Setting Pacing Amplitude: 2.5 V
Lead Channel Setting Pacing Pulse Width: 0.4 ms
Lead Channel Setting Sensing Sensitivity: 2 mV

## 2020-10-21 NOTE — Telephone Encounter (Signed)
PT called and is wanting t o know is him having 2 steroid shots in his knees will effect him getting the 4th booster shot when it is time

## 2020-10-21 NOTE — Telephone Encounter (Signed)
no

## 2020-10-23 NOTE — Telephone Encounter (Signed)
They are both equally effective.

## 2020-10-23 NOTE — Telephone Encounter (Signed)
Lvm to pt . [place]

## 2020-10-23 NOTE — Telephone Encounter (Signed)
Pt would like to know which covid booster is better. Please advise [facility]

## 2020-10-26 NOTE — Telephone Encounter (Signed)
Pt was advised [facility] 

## 2020-10-30 DIAGNOSIS — H35363 Drusen (degenerative) of macula, bilateral: Secondary | ICD-10-CM | POA: Diagnosis not present

## 2020-10-30 DIAGNOSIS — D3131 Benign neoplasm of right choroid: Secondary | ICD-10-CM | POA: Diagnosis not present

## 2020-10-30 DIAGNOSIS — H524 Presbyopia: Secondary | ICD-10-CM | POA: Diagnosis not present

## 2020-10-30 DIAGNOSIS — H35033 Hypertensive retinopathy, bilateral: Secondary | ICD-10-CM | POA: Diagnosis not present

## 2020-10-30 DIAGNOSIS — H40013 Open angle with borderline findings, low risk, bilateral: Secondary | ICD-10-CM | POA: Diagnosis not present

## 2020-10-30 LAB — HM DIABETES EYE EXAM

## 2020-11-03 NOTE — Progress Notes (Signed)
Remote pacemaker transmission.   

## 2020-11-05 DIAGNOSIS — H3500 Unspecified background retinopathy: Secondary | ICD-10-CM | POA: Insufficient documentation

## 2020-11-10 ENCOUNTER — Encounter: Payer: Self-pay | Admitting: Family Medicine

## 2020-12-01 ENCOUNTER — Telehealth: Payer: Self-pay | Admitting: Family Medicine

## 2020-12-01 NOTE — Telephone Encounter (Signed)
Pt called requesting a referral to a ENT. He states that for years he has had sinus issues. Over the years it has gotten worse and now it is really bad. He states that he has seen J[facility]  several times for issue. Please advise pt at [phone].

## 2020-12-01 NOTE — Telephone Encounter (Signed)
It might be better for him to see an allergist and ENT.  Explain that usually ENT will be for more structural issues so find out what his issues are

## 2020-12-02 NOTE — Telephone Encounter (Signed)
ok 

## 2020-12-03 ENCOUNTER — Telehealth: Payer: Self-pay

## 2020-12-03 ENCOUNTER — Other Ambulatory Visit: Payer: Self-pay

## 2020-12-03 DIAGNOSIS — R040 Epistaxis: Secondary | ICD-10-CM

## 2020-12-03 DIAGNOSIS — J309 Allergic rhinitis, unspecified: Secondary | ICD-10-CM

## 2020-12-03 NOTE — Telephone Encounter (Signed)
Pt. Called back to check on the status of his referral to ent/allergists. He said he called Tuesday and has not heard from anyone yet about this referral. I told him I would let you know he called to check on this referral.

## 2020-12-03 NOTE — Telephone Encounter (Signed)
Done KH 

## 2020-12-28 ENCOUNTER — Encounter: Payer: Self-pay | Admitting: Family Medicine

## 2020-12-28 ENCOUNTER — Other Ambulatory Visit: Payer: Self-pay

## 2020-12-28 ENCOUNTER — Telehealth (INDEPENDENT_AMBULATORY_CARE_PROVIDER_SITE_OTHER): Payer: Medicare Other | Admitting: Family Medicine

## 2020-12-28 VITALS — Ht 65.0 in | Wt 152.0 lb

## 2020-12-28 DIAGNOSIS — M545 Low back pain, unspecified: Secondary | ICD-10-CM

## 2020-12-28 NOTE — Progress Notes (Signed)
   Subjective:    Patient ID: NICHOLS CORTER, male    DOB: January 23, 1932, 87 y.o.   MRN: [id number]  HPI Documentation for virtual audio telecommunications .he was unable to get his computer to work properly.  The patient was located at home. 2 patient identifiers used.  The provider was located in the office. The patient did consent to this visit and is aware of possible charges through their insurance for this visit. The other persons participating in this telemedicine service were none. Time spent on call was 5 minutes and in review of previous records >20 minutes total for counseling and coordination of care. This virtual service is not related to other E/M service within previous 7 days. He complains of a 1 week history of left-sided low back pain.  No weakness, numbness or tingling.  He has been intermittently taking various OTC therapies for this with not much success.  Review of Systems     Objective:   Physical Exam Alert and in no distress otherwise not examined      Assessment & Plan:  Acute low back pain without sciatica, unspecified back pain laterality Recommend conservative care with heat 20 minutes 3 times per day, gentle stretching, 2 Tylenol 4 times per day supplemented with ibuprofen as needed.  If continued difficulty will set up an appointment for further evaluation.

## 2021-01-04 ENCOUNTER — Telehealth: Payer: Self-pay | Admitting: Physical Medicine and Rehabilitation

## 2021-01-04 ENCOUNTER — Telehealth: Payer: Self-pay | Admitting: Family Medicine

## 2021-01-04 MED ORDER — ALCLOMETASONE DIPROPIONATE 0.05 % EX CREA: TOPICAL_CREAM | Freq: Two times a day (BID) | CUTANEOUS | 3 refills | Status: DC

## 2021-01-04 NOTE — Telephone Encounter (Signed)
Patient reports that pain is different. Scheduled for OV.

## 2021-01-04 NOTE — Telephone Encounter (Signed)
Left L5-S1 IL on 06/01/2020. Ok to repeat if helped, same problem/side, and no new injury?

## 2021-01-04 NOTE — Telephone Encounter (Signed)
Pt called and states he needs to see Dr.Newton for his back. He wouldn't give me any details.   CB [phone]

## 2021-01-04 NOTE — Telephone Encounter (Signed)
Pt is requesting a refill on Alclomethasone cream sent to [facility]

## 2021-01-13 ENCOUNTER — Other Ambulatory Visit: Payer: Self-pay

## 2021-01-13 ENCOUNTER — Ambulatory Visit: Payer: Medicare Other | Admitting: Physical Medicine and Rehabilitation

## 2021-01-13 ENCOUNTER — Encounter: Payer: Self-pay | Admitting: Physical Medicine and Rehabilitation

## 2021-01-13 VITALS — BP 164/79 | HR 72

## 2021-01-13 DIAGNOSIS — G8929 Other chronic pain: Secondary | ICD-10-CM

## 2021-01-13 DIAGNOSIS — M47816 Spondylosis without myelopathy or radiculopathy, lumbar region: Secondary | ICD-10-CM

## 2021-01-13 DIAGNOSIS — M545 Low back pain, unspecified: Secondary | ICD-10-CM

## 2021-01-13 DIAGNOSIS — M7918 Myalgia, other site: Secondary | ICD-10-CM | POA: Diagnosis not present

## 2021-01-13 DIAGNOSIS — M48061 Spinal stenosis, lumbar region without neurogenic claudication: Secondary | ICD-10-CM | POA: Diagnosis not present

## 2021-01-13 NOTE — Progress Notes (Signed)
May 31st, Left sided low back pain. States that he can't play golf. Numeric Pain Rating Scale and Functional Assessment Average Pain 7 Pain Right Now 3 My pain is constant, dull, and aching Pain is worse with: standing, unsure, and some activites Pain improves with: rest   In the last MONTH (on 0-10 scale) has pain interfered with the following?  1. General activity like being  able to carry out your everyday physical activities such as walking, climbing stairs, carrying groceries, or moving a chair?  Rating(7)  2. Relation with others like being able to carry out your usual social activities and roles such as  activities at home, at work and in your community. Rating(7)  3. Enjoyment of life such that you have  been bothered by emotional problems such as feeling anxious, depressed or irritable?  Rating(5)

## 2021-01-14 ENCOUNTER — Encounter: Payer: Self-pay | Admitting: Physical Medicine and Rehabilitation

## 2021-01-14 NOTE — Progress Notes (Signed)
[name] - 87 y.o. male MRN [id number]  Date of birth: March 23, 1932  Office Visit Note: Visit Date: 01/13/2021 PCP: Denita Lung, MD Referred by: Denita Lung, MD  Subjective: Chief Complaint  Patient presents with   Lower Back - Pain   HPI: [name] is a 87 y.o. male who comes in today For evaluation and management of acute on chronic exacerbation of left-sided low back pain which has been an ongoing problem with him for many years.  Have seen the patient in the past but have not seen him recently.  He reports that on May 31 he began having this left-sided low back pain which is essentially right above the iliac crest on the left and more lateral than the middle of the spine.  He reports severe pain at the time and really limited what he could do.  He had really stopped doing his normal daily exercises which he maintains even at the age of 57.  He also says it really made where he could not play golf because it hurts so bad.  He had periods since that time where the pain was reduced and then it would come back.  He reports an intermittent pain at times but really overall fairly constant dull and aching.  Somewhat better at rest worse with twisting and bending.  No radicular pain down the legs.  No focal weakness no specific trauma.  Patient does have a pacemaker and cannot have MRI and we have had a CT scan done on him in the last few years that shows multilevel facet arthropathy and some level of stenosis which was moderate.  He did have a visit with his primary care physician Dr. Jill Alexanders.  He felt like this was more of a sprain strain type of incident and recommended heat and ice with Tylenol and anti-inflammatory.  Patient has taken the Tylenol and does get some mild relief with that as well as the anti-inflammatory.  He has reported severe pain however and recently did take 1 tramadol that his wife had and he took that at night and that did help him sleep.  He says it is  affecting his sleep.  Review of Systems  Musculoskeletal:  Positive for back pain.  All other systems reviewed and are negative. Otherwise per HPI.  Assessment & Plan: Visit Diagnoses:    ICD-10-CM   1. Chronic left-sided low back pain without sciatica  M54.50    G89.29     2. Spondylosis without myelopathy or radiculopathy, lumbar region  M47.816     3. Spinal stenosis of lumbar region without neurogenic claudication  M48.061     4. Myofascial pain syndrome  M79.18        Plan: Findings:  Chronic history of back pain off and on with ongoing problems with his knees that he sees Dr. Erlinda Hong for.  He has this onset specifically May 31 of left-sided severe back pain.  His history is somewhat consistent with spinal stenosis.  Referral pattern does not seem to be consistent with facet arthropathy.  He does have some slight curvature and when he walks he does count of shift to the side.  He has taut bands in the lower lumbar region and above the iliac crest.  I felt like his pain was likely myofascial pain and I did palpate trigger points in this area that reproduces pain exactly.  In the process of holding pressure on the trigger point which I was  marking for potential injection the patient unchanged 100% relief of his back pain.  During the rest of the visit his back pain was essentially 0.  At this point I did suggest him using a lacrosse ball that he said he could obtain from his granddaughter and using that as a deep tissue massage in that area.  He could also manually do this by holding pressure on those trigger points and I showed him how to do that.  He may benefit from a localized deep tissue massage versus dry needling or trigger point injection in our office.  For right now organ to see how he does.  Would consider repeat imaging of the CT scan if this just did not seem to go away and potential for spine injection but I do think this is more myofascial pain.   Meds & Orders: No orders of the  defined types were placed in this encounter.  No orders of the defined types were placed in this encounter.   Follow-up: No follow-ups on file.   Procedures: No procedures performed      Clinical History: CT LUMBAR SPINE WITHOUT CONTRAST   TECHNIQUE: Multidetector CT imaging of the lumbar spine was performed without intravenous contrast administration. Multiplanar CT image reconstructions were also generated.   COMPARISON:  Lumbar spine radiographs 02/01/2018   FINDINGS: Segmentation: Normal   Alignment: Mild retrolisthesis L1-2, L2-3, L3-4. Mild anterolisthesis L4-5 approximately 4 mm. 5 mm retrolisthesis L5-S1. Mild lumbar scoliosis.   Vertebrae: Negative for fracture or mass   Paraspinal and other soft tissues: Heavily calcified aorta and iliac arteries without aneurysm. No retroperitoneal adenopathy or mass   Disc levels: T11-12: Bilateral facet degeneration. Negative for stenosis   L1-2: Advanced disc degeneration with disc space narrowing and mild spurring. Mild facet degeneration. Moderate right foraminal stenosis due to spurring   L1-2: Advanced disc degeneration with disc space narrowing. Diffuse disc bulging. Moderate right foraminal encroachment due to vertebral endplate spurring and facet hypertrophy.   L2-3: Advanced disc degeneration with fusion of the disc space on the right. Mild foraminal stenosis bilaterally.   L3-4: Disc degeneration and spondylosis. Bilateral facet hypertrophy. Severe left foraminal encroachment and mild right foraminal encroachment due to spurring   L4-5: 4 mm anterolisthesis with advanced disc degeneration. Advanced facet hypertrophy bilaterally. Moderate central canal stenosis. Severe subarticular stenosis bilaterally with severe left foraminal encroachment.   L5-S1: 5 mm retrolisthesis with advanced disc degeneration. Diffuse endplate spurring and mild facet hypertrophy. Left-sided facet degeneration and spurring  projecting into the spinal canal and likely affecting the left L5 nerve root. Moderate subarticular stenosis and foraminal stenosis bilaterally due to spurring   IMPRESSION: Advanced multilevel degenerative change throughout the lumbar spine with extensive disc space narrowing and spurring. Multilevel foraminal encroachment due to spurring as described above. Moderate central canal stenosis at L4-5.   Atherosclerotic aorta     Electronically Signed   By: Franchot Gallo M.D.   On: 02/19/2018 13:32   He reports that he quit smoking about 58 years ago. His smoking use included cigarettes. He has a 19.50 pack-year smoking history. He has never used smokeless tobacco. No results for input(s): HGBA1C, LABURIC in the last 8760 hours.  Objective:  VS:  HT:    WT:   BMI:     BP:(!) 164/79  HR:72bpm  TEMP: ( )  RESP:  Physical Exam Vitals and nursing note reviewed.  Constitutional:      General: He is not in acute distress.  Appearance: Normal appearance. He is not ill-appearing.  HENT:     Head: Normocephalic and atraumatic.     Right Ear: External ear normal.     Left Ear: External ear normal.     Nose: No congestion.  Eyes:     Extraocular Movements: Extraocular movements intact.  Cardiovascular:     Rate and Rhythm: Normal rate.     Pulses: Normal pulses.  Pulmonary:     Effort: Pulmonary effort is normal. No respiratory distress.  Abdominal:     General: There is no distension.     Palpations: Abdomen is soft.  Musculoskeletal:        General: No tenderness or signs of injury.     Cervical back: Neck supple.     Right lower leg: No edema.     Left lower leg: No edema.     Comments: Patient has good distal strength without clonus.  He stands with a forward flexed lumbar spine with somewhat of a left lumbar curvature.  He has very tight muscles in the lower quadratus lumborum and potentially latissimus dorsi where it attaches to the iliac crest.  I did palpate trigger  points in this area that reproduces pain exactly.  He has some pain with extension and facet loading.  No pain with hip rotation.  Skin:    Findings: No erythema or rash.  Neurological:     General: No focal deficit present.     Mental Status: He is alert and oriented to person, place, and time.     Cranial Nerves: No cranial nerve deficit.     Sensory: No sensory deficit.     Motor: No weakness or abnormal muscle tone.     Coordination: Coordination normal.     Gait: Gait abnormal.  Psychiatric:        Mood and Affect: Mood normal.        Behavior: Behavior normal.    Ortho Exam  Imaging: No results found.  Past Medical/Family/Surgical/Social History: Medications & Allergies reviewed per EMR, new medications updated. Patient Active Problem List   Diagnosis Date Noted   Retinopathy  11/05/2020   Senile purpura ([place]) 04/30/2020   Stage 3a chronic kidney disease ([place]) 04/30/2020   Constipation 04/30/2020   Primary osteoarthritis of left knee 10/03/2019   Primary osteoarthritis of right knee 10/03/2019   Gastroesophageal reflux disease 04/29/2019   Pacemaker 04/21/2017   Complete heart block ([place]) 01/11/2017   History of prostate cancer 12/27/2010   Arthritis 12/27/2010   History of cataract extraction 12/27/2010   HH (hiatus hernia)    History of colonic polyps    Hyperlipidemia LDL goal <130 04/21/2008   Allergic rhinitis 04/21/2008   Essential hypertension 04/18/2008   Past Medical History:  Diagnosis Date   1st degree AV block    Arthritis    "hands, back, neck, shoulders, knees" (01/11/2017)   Eczema    GERD (gastroesophageal reflux disease)    HH (hiatus hernia)    History of blood transfusion    "maybe; w/prostate OR; they would have used my own" (01/11/2017)   History of colonic polyps    "I don't think I've had any polyps" (01/11/2017)   Hypercholesterolemia    Hypertension    Presence of permanent cardiac pacemaker    Prostate cancer ([place]) 1997   Seasonal  allergies    SVT (supraventricular tachycardia) ([place])    Family History  Problem Relation Age of Onset   Cancer Mother    Alcoholism  Father    Past Surgical History:  Procedure Laterality Date   AV NODE ABLATION  2000s X 2   AVNODE REENTRANT ABLATION   CATARACT EXTRACTION W/ INTRAOCULAR LENS  IMPLANT, BILATERAL     COLONOSCOPY  2007   MAGOD   INSERT / REPLACE / REMOVE PACEMAKER  01/11/2017   PACEMAKER IMPLANT N/A 01/11/2017   Procedure: Pacemaker Implant;  Surgeon: Evans Lance, MD;  Location: [facility];  Service: Cardiovascular;  Laterality: N/A;   PROSTATECTOMY     SKIN BIOPSY Right 02/04/2019   verruca vulgaris, irritated   TESTICLE SURGERY Left 2010   "benign growth"; [place] History   Occupational History   Occupation: retired  Tobacco Use   Smoking status: Former    Packs/day: 1.50    Years: 13.00    Pack years: 19.50    Types: Cigarettes    Quit date: 08/25/1962    Years since quitting: 58.4   Smokeless tobacco: Never  Vaping Use   Vaping Use: Never used  Substance and Sexual Activity   Alcohol use: No   Drug use: No   Sexual activity: Never

## 2021-01-20 ENCOUNTER — Ambulatory Visit (INDEPENDENT_AMBULATORY_CARE_PROVIDER_SITE_OTHER): Payer: Medicare Other

## 2021-01-20 DIAGNOSIS — I442 Atrioventricular block, complete: Secondary | ICD-10-CM | POA: Diagnosis not present

## 2021-01-20 LAB — CUP PACEART REMOTE DEVICE CHECK
Battery Remaining Longevity: 79 mo
Battery Voltage: 2.97 V
Brady Statistic AP VP Percent: 68.22 %
Brady Statistic AP VS Percent: 0.04 %
Brady Statistic AS VP Percent: 31.3 %
Brady Statistic AS VS Percent: 0.45 %
Brady Statistic RA Percent Paced: 67.83 %
Brady Statistic RV Percent Paced: 99.51 %
Date Time Interrogation Session: 20220629070409
Implantable Lead Implant Date: 20180620
Implantable Lead Implant Date: 20180620
Implantable Lead Location: 753859
Implantable Lead Location: 753860
Implantable Lead Model: 5076
Implantable Lead Model: 5076
Implantable Pulse Generator Implant Date: 20180620
Lead Channel Impedance Value: 304 Ohm
Lead Channel Impedance Value: 361 Ohm
Lead Channel Impedance Value: 380 Ohm
Lead Channel Impedance Value: 399 Ohm
Lead Channel Pacing Threshold Amplitude: 0.5 V
Lead Channel Pacing Threshold Amplitude: 0.75 V
Lead Channel Pacing Threshold Pulse Width: 0.4 ms
Lead Channel Pacing Threshold Pulse Width: 0.4 ms
Lead Channel Sensing Intrinsic Amplitude: 2.25 mV
Lead Channel Sensing Intrinsic Amplitude: 2.25 mV
Lead Channel Sensing Intrinsic Amplitude: 3.5 mV
Lead Channel Sensing Intrinsic Amplitude: 3.5 mV
Lead Channel Setting Pacing Amplitude: 1.5 V
Lead Channel Setting Pacing Amplitude: 2.5 V
Lead Channel Setting Pacing Pulse Width: 0.4 ms
Lead Channel Setting Sensing Sensitivity: 2 mV

## 2021-02-01 ENCOUNTER — Other Ambulatory Visit: Payer: Self-pay | Admitting: Family Medicine

## 2021-02-01 DIAGNOSIS — I10 Essential (primary) hypertension: Secondary | ICD-10-CM

## 2021-02-03 NOTE — Progress Notes (Signed)
Remote pacemaker transmission.   

## 2021-02-18 ENCOUNTER — Ambulatory Visit: Payer: Medicare Other | Admitting: Allergy

## 2021-02-18 ENCOUNTER — Encounter: Payer: Self-pay | Admitting: Allergy

## 2021-02-18 ENCOUNTER — Other Ambulatory Visit: Payer: Self-pay

## 2021-02-18 VITALS — BP 140/70 | HR 66 | Temp 98.7°F | Resp 16 | Ht 65.0 in | Wt 153.0 lb

## 2021-02-18 DIAGNOSIS — J300 Vasomotor rhinitis: Secondary | ICD-10-CM | POA: Diagnosis not present

## 2021-02-18 DIAGNOSIS — J301 Allergic rhinitis due to pollen: Secondary | ICD-10-CM | POA: Diagnosis not present

## 2021-02-18 MED ORDER — AZELASTINE HCL 0.1 % NA SOLN: 2.0000 | Freq: Two times a day (BID) | NASAL | 5 refills | Status: DC

## 2021-02-18 NOTE — Progress Notes (Signed)
We will   New Patient Note  RE: [name] MRN: [id number] DOB: 1932/01/22 Date of Office Visit: 02/18/2021 Primary care provider: Denita Lung, MD  Chief Complaint: nasal drainage  History of present illness: [name] is a 87 y.o. male presenting today for evaluation of nasal drainage.   He reports he has severe nasal drainage.  He states right now the nose will drain during the day too however it is worse when he lays down.   He states being off zyrtec for the last 3 days for this visit the drainage has been worse.  He keeps kleenex all over the home for the nasal drainage. He states a lot of times after eating breakfast he has a lot of drainage and sneezing.  Symptoms are year-round.   He has tried different oral antihistamines besides zyrtec but zyrtec has been more effective.  He was on fluticasone and nasacort nasal sprays in the past but was having nose bleeds that required cauterization.  He is currently not using any nasal sprays.   He does see dermatology due to needing skin checks and reports he has itchy skin and uses a back scratcher during the day.  He states he has seen an allergist before but it was about 30 years ago.     Review of systems: Review of Systems  Constitutional: Negative.   HENT:         See HPI  Eyes: Negative.   Respiratory: Negative.    Cardiovascular: Negative.   Gastrointestinal: Negative.   Musculoskeletal: Negative.   Skin:  Positive for itching. Negative for rash.  Neurological: Negative.    All other systems negative unless noted above in HPI  Past medical history: Past Medical History:  Diagnosis Date   1st degree AV block    Arthritis    "hands, back, neck, shoulders, knees" (01/11/2017)   Eczema    GERD (gastroesophageal reflux disease)    HH (hiatus hernia)    History of blood transfusion    "maybe; w/prostate OR; they would have used my own" (01/11/2017)   History of colonic polyps    "I don't think I've had any  polyps" (01/11/2017)   Hypercholesterolemia    Hypertension    Presence of permanent cardiac pacemaker    Prostate cancer ([place]) 1997   Seasonal allergies    SVT (supraventricular tachycardia) ([place])     Past surgical history: Past Surgical History:  Procedure Laterality Date   AV NODE ABLATION  2000s X 2   AVNODE REENTRANT ABLATION   CATARACT EXTRACTION W/ INTRAOCULAR LENS  IMPLANT, BILATERAL     COLONOSCOPY  2007   MAGOD   INSERT / REPLACE / REMOVE PACEMAKER  01/11/2017   PACEMAKER IMPLANT N/A 01/11/2017   Procedure: Pacemaker Implant;  Surgeon: Evans Lance, MD;  Location: [facility];  Service: Cardiovascular;  Laterality: N/A;   PROSTATECTOMY     SKIN BIOPSY Right 02/04/2019   verruca vulgaris, irritated   TESTICLE SURGERY Left 2010   "benign growth"; [place]   TONSILLECTOMY      Family history:  Family History  Problem Relation Age of Onset   Cancer Mother    Alcoholism Father     Social history: Lives in a home with carpeting in the bedroom with gas heating and central cooling.  Parakeet in the home.  There is no concern for water damage, mildew or roaches in the home. Occupational History   Occupation: retired  Tobacco Use  Smoking status: Former    Packs/day: 1.50    Years: 13.00    Pack years: 19.50    Types: Cigarettes    Quit date: 08/25/1962    Years since quitting: 58.5   Smokeless tobacco: Never  Vaping Use   Vaping Use: Never used     Medication List: Current Outpatient Medications  Medication Sig Dispense Refill   acetaminophen (TYLENOL) 650 MG CR tablet Take 650 mg by mouth daily as needed for pain.     alclomethasone (ACLOVATE) 0.05 % cream Apply topically 2 (two) times daily. 30 g 3   amLODipine (NORVASC) 5 MG tablet TAKE 1 TABLET BY MOUTH  DAILY 90 tablet 0   aspirin 81 MG tablet Take 81 mg by mouth daily.     cetirizine (ZYRTEC) 10 MG tablet Take 10 mg by mouth at bedtime.      desonide (DESOWEN) 0.05 % cream Apply topically.      hydrochlorothiazide (HYDRODIURIL) 12.5 MG tablet Take 1 tablet (12.5 mg total) by mouth daily. 90 tablet 3   ibuprofen (ADVIL,MOTRIN) 200 MG tablet Take 400 mg by mouth daily as needed (pain).     omeprazole (PRILOSEC) 20 MG capsule TAKE 1 CAPSULE BY MOUTH  TWICE DAILY BEFORE MEALS 180 capsule 3   pravastatin (PRAVACHOL) 40 MG tablet TAKE 1 TABLET BY MOUTH  DAILY 90 tablet 3   triamcinolone cream (KENALOG) 0.1 % Apply topically 2 (two) times daily. 45 g 1   No current facility-administered medications for this visit.    Known medication allergies: Allergies  Allergen Reactions   Codeine Nausea And Vomiting     Physical examination: Blood pressure 140/70, pulse 66, temperature 98.7 F (37.1 C), temperature source Temporal, resp. rate 16, height '5\' 5"'$  (1.651 m), weight 153 lb (69.4 kg), SpO2 99 %.  General: Alert, interactive, in no acute distress. HEENT: PERRLA, TMs pearly gray, turbinates non-edematous without discharge, post-pharynx non erythematous. Neck: Supple without lymphadenopathy. Lungs: Clear to auscultation without wheezing, rhonchi or rales. {no increased work of breathing. CV: Normal S1, S2 without murmurs. Abdomen: Nondistended, nontender. Skin: Warm and dry, without lesions or rashes. Extremities:  No clubbing, cyanosis or edema. Neuro:   Grossly intact.  Diagnositics/Labs:  Allergy testing: environmental allergy skin prick testing is [facility] Allergy testing results were read and interpreted by provider, documented by clinical staff.   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  As nasal drainage improved can decrease to once a day and then to as needed use -point tip of nasal spray towards eye of same side nostril -environmental allergy testing is tree pollen (elm) and  weed pollen (burweed marshelder) -allergen avoidance measures discussed/handouts provided  Follow-up in 2-3 months or sooner if needed  I appreciate the opportunity to take part in [name]'s care. Please do not hesitate to contact me with questions.  Sincerely,   Prudy Feeler, MD Allergy/Immunology [facility]

## 2021-02-18 NOTE — Patient Instructions (Addendum)
Mixed rhinitis -- Nasal drainage -most likely allergic with component of vasomotor rhinitis -continue Zyrtec '10mg'$  daily -start nasal antihistamine, Astelin 2 sprays each nostril twice a day at this time.  As nasal drainage improved can decrease to once a day and then to as needed use -point tip of nasal spray towards eye of same side nostril -environmental allergy testing is tree pollen (elm) and weed pollen (burweed marshelder) -allergen avoidance measures discussed/handouts provided  Follow-up in 2-3 months or sooner if needed

## 2021-02-22 DIAGNOSIS — L304 Erythema intertrigo: Secondary | ICD-10-CM | POA: Diagnosis not present

## 2021-02-22 DIAGNOSIS — L400 Psoriasis vulgaris: Secondary | ICD-10-CM | POA: Diagnosis not present

## 2021-02-22 DIAGNOSIS — L570 Actinic keratosis: Secondary | ICD-10-CM | POA: Diagnosis not present

## 2021-02-24 ENCOUNTER — Telehealth: Payer: Self-pay | Admitting: Allergy

## 2021-02-24 NOTE — Telephone Encounter (Signed)
Patient was prescribed Azelastine at last visit. Patient would like to speak to someone about how medication is working. Patient would prefer to speak to Dr. Nelva Bush directly.   Best contact number: [phone] (cell phone)

## 2021-02-26 MED ORDER — IPRATROPIUM BROMIDE 0.06 % NA SOLN: NASAL | 5 refills | Status: DC

## 2021-02-26 NOTE — Telephone Encounter (Signed)
I called the patient to go over nasal sprays. Patient verbalized understanding that he is to use the Atrovent nasal spray 2 sprays each nostril 3-4 times a day as needed for nasal drainage and continue with the Astelin 2 sprays each nostril twice a day for maintenance. I Sent in the Atrovent nasal spray to the [facility] on [address].

## 2021-03-18 ENCOUNTER — Telehealth: Payer: Self-pay | Admitting: Physician Assistant

## 2021-03-18 MED ORDER — LINACLOTIDE 72 MCG PO CAPS: ORAL_CAPSULE | ORAL | 5 refills | Status: DC

## 2021-03-18 NOTE — Telephone Encounter (Signed)
Spoke with patient, he states that he has been taking Miralax once a day and having consistent bowel movements. His last BM was yesterday. He states that he continues to have bowel movements daily but they vary in consistency. Pt states that he does get some gas/bloating but takes Gas-X PRN. Pt states that he does not recall trying Linzess the last time. Pt is wanting to know if there is something out now that is better than Miralax. Patient is requesting to speak with [name] over the phone, offered to schedule patient an OV but he just wants to talk over the phone. Please advise, thanks.

## 2021-03-18 NOTE — Telephone Encounter (Signed)
Telephone Call [phone] 03/18/2021  Patient had called in to speak with nursing staff in regards to continued symptoms.  He was last seen in clinic 07/14/2020 and at that time we discussed the possibility of Linzess 72 mcg daily if he continues symptoms.  He tells me he is still on his regular regimen and will have a bowel movement every morning but it is "not the same kind of bowel movement", sometimes it even feels incomplete or small.  Typically takes MiraLAX 1 dose around 3 PM which she has been doing for years.  Continues with some bloating and discomfort.  He would like to try something else.  Will prescribe Linzess 72 mcg daily 30 to 60 minutes before breakfast #30 with 5 refills.  Told patient to stay on this consistently over the next 1 to 2 months and let me know how he is doing.  He was thankful for my call.  Ellouise Newer, PA-C.  [place] can you please send in Linzess 72 mcg daily.  Thanks.

## 2021-03-18 NOTE — Telephone Encounter (Signed)
RX sent to pharmacy on file, pt requested [facility].

## 2021-03-18 NOTE — Addendum Note (Signed)
Addended by: Yevette Edwards on: 03/18/2021 12:43 PM   Modules accepted: Orders

## 2021-03-24 ENCOUNTER — Telehealth: Payer: Self-pay | Admitting: Physician Assistant

## 2021-03-24 NOTE — Telephone Encounter (Signed)
Spoke with patient, he states that he has been on the Linzess 72 mcg since Friday. He states that the 1st stool started off loose, then progressed to having a very hard stool for the past couple of days. Pt states that he is having to strain and scared that he may develop hemorrhoids. Advised patient that he probably will not see the full effect until he tries it for a few weeks. Pt would like to know if there is something else that can be done, he doesn't know if he can take this for a month and having to strain. Please advise, thanks.

## 2021-03-24 NOTE — Telephone Encounter (Signed)
Spoke with patient in regards to recommendations. He states that he does not want to be back on the Miralax but will try adding one dose back along with continuing the Linzess. Pt had no concerns at the end of the call.

## 2021-03-30 ENCOUNTER — Other Ambulatory Visit: Payer: Self-pay | Admitting: Family Medicine

## 2021-03-30 DIAGNOSIS — K449 Diaphragmatic hernia without obstruction or gangrene: Secondary | ICD-10-CM

## 2021-04-15 ENCOUNTER — Telehealth: Payer: Self-pay | Admitting: Allergy

## 2021-04-15 ENCOUNTER — Telehealth: Payer: Self-pay

## 2021-04-15 NOTE — Telephone Encounter (Signed)
Pt. Called stating he just wanted to know if he could take Mucinex for his sinus drainage with all of the other medications he is on. I told him we usually recommend Mucinex for people but he still wanted me to check with you.

## 2021-04-15 NOTE — Telephone Encounter (Signed)
Patient called and would like for a nurse to call him about is allergy are. [phone]

## 2021-04-15 NOTE — Telephone Encounter (Signed)
Called patient and left a message for him to call our office back  [place]

## 2021-04-20 NOTE — Telephone Encounter (Signed)
Called and left a voicemail asking for patient to return call to discuss.  °

## 2021-04-21 ENCOUNTER — Ambulatory Visit (INDEPENDENT_AMBULATORY_CARE_PROVIDER_SITE_OTHER): Payer: Medicare Other

## 2021-04-21 DIAGNOSIS — I442 Atrioventricular block, complete: Secondary | ICD-10-CM

## 2021-04-21 NOTE — Telephone Encounter (Signed)
Called both numbers provided and left a voicemail asking for patient to return call to discuss.

## 2021-04-22 ENCOUNTER — Other Ambulatory Visit: Payer: Self-pay

## 2021-04-22 ENCOUNTER — Encounter: Payer: Self-pay | Admitting: Orthopaedic Surgery

## 2021-04-22 ENCOUNTER — Ambulatory Visit: Payer: Medicare Other | Admitting: Orthopaedic Surgery

## 2021-04-22 DIAGNOSIS — M1711 Unilateral primary osteoarthritis, right knee: Secondary | ICD-10-CM | POA: Diagnosis not present

## 2021-04-22 LAB — CUP PACEART REMOTE DEVICE CHECK
Battery Remaining Longevity: 74 mo
Battery Voltage: 2.97 V
Brady Statistic AP VP Percent: 66.74 %
Brady Statistic AP VS Percent: 0.03 %
Brady Statistic AS VP Percent: 32.4 %
Brady Statistic AS VS Percent: 0.83 %
Brady Statistic RA Percent Paced: 66.28 %
Brady Statistic RV Percent Paced: 99.14 %
Date Time Interrogation Session: 20220928061506
Implantable Lead Implant Date: 20180620
Implantable Lead Implant Date: 20180620
Implantable Lead Location: 753859
Implantable Lead Location: 753860
Implantable Lead Model: 5076
Implantable Lead Model: 5076
Implantable Pulse Generator Implant Date: 20180620
Lead Channel Impedance Value: 304 Ohm
Lead Channel Impedance Value: 361 Ohm
Lead Channel Impedance Value: 380 Ohm
Lead Channel Impedance Value: 399 Ohm
Lead Channel Pacing Threshold Amplitude: 0.5 V
Lead Channel Pacing Threshold Amplitude: 0.875 V
Lead Channel Pacing Threshold Pulse Width: 0.4 ms
Lead Channel Pacing Threshold Pulse Width: 0.4 ms
Lead Channel Sensing Intrinsic Amplitude: 2.5 mV
Lead Channel Sensing Intrinsic Amplitude: 2.5 mV
Lead Channel Sensing Intrinsic Amplitude: 3.75 mV
Lead Channel Sensing Intrinsic Amplitude: 3.75 mV
Lead Channel Setting Pacing Amplitude: 1.5 V
Lead Channel Setting Pacing Amplitude: 2.5 V
Lead Channel Setting Pacing Pulse Width: 0.4 ms
Lead Channel Setting Sensing Sensitivity: 2 mV

## 2021-04-22 MED ORDER — METHYLPREDNISOLONE ACETATE 40 MG/ML IJ SUSP
40.0000 mg | INTRAMUSCULAR | Status: AC | PRN
  Administered 2021-04-22: 40 mg via INTRA_ARTICULAR

## 2021-04-22 MED ORDER — BUPIVACAINE HCL 0.5 % IJ SOLN
2.0000 mL | INTRAMUSCULAR | Status: AC | PRN
  Administered 2021-04-22: 2 mL via INTRA_ARTICULAR

## 2021-04-22 MED ORDER — LIDOCAINE HCL 1 % IJ SOLN
2.0000 mL | INTRAMUSCULAR | Status: AC | PRN
  Administered 2021-04-22: 2 mL

## 2021-04-22 NOTE — Progress Notes (Signed)
Office Visit Note   Patient: [name]           Date of Birth: 1931-09-21           MRN: [id number] Visit Date: 04/22/2021              Requested by: Denita Lung, MD [place],  [place] [address] PCP: Denita Lung, MD   Assessment & Plan: Visit Diagnoses:  1. Primary osteoarthritis of right knee     Plan: Impression is right knee DJD.  He continues to receive relief from cortisone injections.  We repeated this injection today.  We will see him back as needed.  Follow-Up Instructions: No follow-ups on file.   Orders:  No orders of the defined types were placed in this encounter.  No orders of the defined types were placed in this encounter.     Procedures: Large Joint Inj: R knee on 04/22/2021 9:59 AM Indications: pain Details: 22 G needle  Arthrogram: No  Medications: 40 mg methylPREDNISolone acetate 40 MG/ML; 2 mL lidocaine 1 %; 2 mL bupivacaine 0.5 % Consent was given by the patient. Patient was prepped and draped in the usual sterile fashion.      Clinical Data: No additional findings.   Subjective: Chief Complaint  Patient presents with  . Right Knee - Pain    HPI  Xzavion comes in today for follow-up of right knee pain and DJD.  He has been getting relief from cortisone injections.  Previous one was in March.  Relief has worn off.  Requesting another injection today.  Review of Systems   Objective: Vital Signs: There were no vitals taken for this visit.  Physical Exam  Ortho Exam  Right knee exam is unchanged.  Specialty Comments:  No specialty comments available.  Imaging: No results found.   PMFS History: Patient Active Problem List   Diagnosis Date Noted  . Retinopathy  11/05/2020  . Senile purpura ([place]) 04/30/2020  . Stage 3a chronic kidney disease ([place]) 04/30/2020  . Constipation 04/30/2020  . Primary osteoarthritis of left knee 10/03/2019  . Primary osteoarthritis of right knee 10/03/2019  .  Gastroesophageal reflux disease 04/29/2019  . Pacemaker 04/21/2017  . Complete heart block ([place]) 01/11/2017  . History of prostate cancer 12/27/2010  . Arthritis 12/27/2010  . History of cataract extraction 12/27/2010  . HH (hiatus hernia)   . History of colonic polyps   . Hyperlipidemia LDL goal <130 04/21/2008  . Allergic rhinitis 04/21/2008  . Essential hypertension 04/18/2008   Past Medical History:  Diagnosis Date  . 1st degree AV block   . Arthritis    "hands, back, neck, shoulders, knees" (01/11/2017)  . Eczema   . GERD (gastroesophageal reflux disease)   . HH (hiatus hernia)   . History of blood transfusion    "maybe; w/prostate OR; they would have used my own" (01/11/2017)  . History of colonic polyps    "I don't think I've had any polyps" (01/11/2017)  . Hypercholesterolemia   . Hypertension   . Presence of permanent cardiac pacemaker   . Prostate cancer ([place]) 1997  . Seasonal allergies   . SVT (supraventricular tachycardia) (HCC)     Family History  Problem Relation Age of Onset  . Cancer Mother   . Alcoholism Father     Past Surgical History:  Procedure Laterality Date  . AV NODE ABLATION  2000s X 2   AVNODE REENTRANT ABLATION  . CATARACT EXTRACTION W/  INTRAOCULAR LENS  IMPLANT, BILATERAL    . COLONOSCOPY  2007   MAGOD  . INSERT / REPLACE / REMOVE PACEMAKER  01/11/2017  . PACEMAKER IMPLANT N/A 01/11/2017   Procedure: Pacemaker Implant;  Surgeon: Evans Lance, MD;  Location: [facility];  Service: Cardiovascular;  Laterality: N/A;  . PROSTATECTOMY    . SKIN BIOPSY Right 02/04/2019   verruca vulgaris, irritated  . TESTICLE SURGERY Left 2010   "benign growth"; KIMBROUGH  . TONSILLECTOMY     Social History   Occupational History  . Occupation: retired  Tobacco Use  . Smoking status: Former    Packs/day: 1.50    Years: 13.00    Pack years: 19.50    Types: Cigarettes    Quit date: 08/25/1962    Years since quitting: 58.6  . Smokeless tobacco:  Never  Vaping Use  . Vaping Use: Never used  Substance and Sexual Activity  . Alcohol use: No  . Drug use: No  . Sexual activity: Yes

## 2021-04-28 NOTE — Progress Notes (Signed)
Remote pacemaker transmission.   

## 2021-05-03 ENCOUNTER — Encounter: Payer: Self-pay | Admitting: Family Medicine

## 2021-05-03 ENCOUNTER — Other Ambulatory Visit: Payer: Self-pay | Admitting: Family Medicine

## 2021-05-03 ENCOUNTER — Other Ambulatory Visit: Payer: Self-pay

## 2021-05-03 ENCOUNTER — Ambulatory Visit (INDEPENDENT_AMBULATORY_CARE_PROVIDER_SITE_OTHER): Payer: Medicare Other | Admitting: Family Medicine

## 2021-05-03 VITALS — BP 122/70 | HR 74 | Temp 98.1°F | Ht 65.75 in | Wt 153.8 lb

## 2021-05-03 DIAGNOSIS — H3500 Unspecified background retinopathy: Secondary | ICD-10-CM | POA: Diagnosis not present

## 2021-05-03 DIAGNOSIS — M1990 Unspecified osteoarthritis, unspecified site: Secondary | ICD-10-CM | POA: Diagnosis not present

## 2021-05-03 DIAGNOSIS — K5900 Constipation, unspecified: Secondary | ICD-10-CM | POA: Diagnosis not present

## 2021-05-03 DIAGNOSIS — K219 Gastro-esophageal reflux disease without esophagitis: Secondary | ICD-10-CM

## 2021-05-03 DIAGNOSIS — E785 Hyperlipidemia, unspecified: Secondary | ICD-10-CM

## 2021-05-03 DIAGNOSIS — D692 Other nonthrombocytopenic purpura: Secondary | ICD-10-CM | POA: Diagnosis not present

## 2021-05-03 DIAGNOSIS — J209 Acute bronchitis, unspecified: Secondary | ICD-10-CM | POA: Diagnosis not present

## 2021-05-03 DIAGNOSIS — J301 Allergic rhinitis due to pollen: Secondary | ICD-10-CM

## 2021-05-03 DIAGNOSIS — K449 Diaphragmatic hernia without obstruction or gangrene: Secondary | ICD-10-CM

## 2021-05-03 DIAGNOSIS — Z950 Presence of cardiac pacemaker: Secondary | ICD-10-CM

## 2021-05-03 DIAGNOSIS — Z0000 Encounter for general adult medical examination without abnormal findings: Secondary | ICD-10-CM | POA: Diagnosis not present

## 2021-05-03 DIAGNOSIS — M1711 Unilateral primary osteoarthritis, right knee: Secondary | ICD-10-CM | POA: Diagnosis not present

## 2021-05-03 DIAGNOSIS — M1712 Unilateral primary osteoarthritis, left knee: Secondary | ICD-10-CM

## 2021-05-03 DIAGNOSIS — I10 Essential (primary) hypertension: Secondary | ICD-10-CM | POA: Diagnosis not present

## 2021-05-03 DIAGNOSIS — I442 Atrioventricular block, complete: Secondary | ICD-10-CM

## 2021-05-03 MED ORDER — AMOXICILLIN-POT CLAVULANATE 875-125 MG PO TABS
1.0000 | ORAL_TABLET | Freq: Two times a day (BID) | ORAL | 0 refills | Status: DC
Start: 2021-05-03 — End: 2021-11-12

## 2021-05-03 NOTE — Progress Notes (Signed)
[name] is a 87 y.o. male who presents for annual wellness visit,CPE and follow-up on chronic medical conditions.  He has had symptoms since last Tuesday of mid chest burning, fatigue, difficulty with sleep, metallic taste in his mouth.  He continues have difficulty with postnasal drainage especially when he lies down.  He has seen ENT in the past with no relief of his symptoms.  He gave him a nasal spray which apparently did not work.  He now also complains of a 2-day history of productive cough but no fever or chills.  He has been checking his pulse ox at home and it runs around 92 sedentary and with walking.  He is having no chest pain or shortness of breath.  He also complains of anorexia as well as diarrhea for the last 4 to 5 days.  In the past he had difficulty with constipation and has been using MiraLAX which she has stopped.  He seen no blood or pus or having abdominal pain.  He continues on the Prilosec.  He is also listed taking Linzess which I will stop.  He has tried various allergy meds without much success.  Continues on amlodipine, hydrochlorothiazide.  Also taking pravastatin for his lipids.  He follows up regularly with cardiology for his cardiac issues including pacemaker.  He periodically gets shots in his knees to help with his underlying arthritis.   Immunizations and Health Maintenance Immunization History  Administered Date(s) Administered   PFIZER(Purple Top)SARS-COV-2 Vaccination 08/13/2019, 09/02/2019, 04/30/2020   Pneumococcal Conjugate-13 01/20/2014   Pneumococcal Polysaccharide-23 01/03/2012   Tdap 05/25/2000, 01/03/2012   Zoster Recombinat (Shingrix) 10/26/2017   Health Maintenance Due  Topic Date Due   Zoster Vaccines- Shingrix (2 of 2) 12/21/2017   COVID-19 Vaccine (4 - Booster for Pfizer series) 08/31/2020    Last colonoscopy: aged out 07/25/2005 Last PSA: aged out  Dentist: Q six months Ophtho: Q year Exercise: stretching, stationary bike QD   Other  doctors caring for patient include: Dr. Valinda Party, PA GI; Dr. Lovena Le cardiology  Advanced Directives: Does Patient Have a Medical Advance Directive?: Yes Type of Advance Directive: Living will, Healthcare Power of Attorney, Out of facility DNR (pink MOST or yellow form) Does patient want to make changes to medical advance directive?: No - Patient declined Copy of [place] in Chart?: Yes - validated most recent copy scanned in chart (See row information)  Depression screen:  See questionnaire below.     Depression screen [facility] 2/9 05/03/2021 04/30/2020 04/29/2019 04/23/2018 02/05/2018  Decreased Interest 0 0 0 0 0  Down, Depressed, Hopeless 0 0 0 0 0  PHQ - 2 Score 0 0 0 0 0  Some recent data might be hidden    Fall Screen: See Questionaire below.   Fall Risk  05/03/2021 04/30/2020 04/29/2019 04/23/2018 02/05/2018  Falls in the past year? 0 0 0 No No  Number falls in past yr: 0 - - - -  Injury with Fall? 0 - - - -  Risk for fall due to : No Fall Risks - - - -  Follow up Falls evaluation completed - - - -    ADL screen:  See questionnaire below.  Functional Status Survey: Is the patient deaf or have difficulty hearing?: Yes Does the patient have difficulty seeing, even when wearing glasses/contacts?: No Does the patient have difficulty concentrating, remembering, or making decisions?: No Does the patient have difficulty walking or climbing stairs?: No Does the patient  have difficulty dressing or bathing?: No Does the patient have difficulty doing errands alone such as visiting a doctor's office or shopping?: No   Review of Systems  Constitutional: -, -unexpected weight change, -anorexia, -fatigue Allergy: -sneezing, -itching, -congestion Dermatology: denies changing moles, rash, lumps ENT: -runny nose, -ear pain, -sore throat,  Cardiology:  -chest pain, -palpitations, -orthopnea, Respiratory: -cough, -shortness of breath, -dyspnea on exertion, -wheezing,   Gastroenterology: -abdominal pain, -nausea, -vomiting, -diarrhea, -constipation, -dysphagia Hematology: -bleeding or bruising problems Musculoskeletal: -arthralgias, -myalgias, -joint swelling, -back pain, - Ophthalmology: -vision changes,  Urology: -dysuria, -difficulty urinating,  -urinary frequency, -urgency, incontinence Neurology: -, -numbness, , -memory loss, -falls, -dizziness    PHYSICAL EXAM:   General Appearance: Alert, cooperative, no distress, appears stated age Head: Normocephalic, without obvious abnormality, atraumatic Eyes: PERRL, conjunctiva/corneas clear, EOM's intact,  Ears: Normal TM's and external ear canals Nose: Nares normal, mucosa normal, no drainage or sinus   tenderness Throat: Lips, mucosa, and tongue normal; teeth and gums normal Neck: Supple, no lymphadenopathy, thyroid:no enlargement/tenderness/nodules; no carotid bruit or JVD Lungs: Clear to auscultation bilaterally without wheezes, rales or ronchi; respirations unlabored Heart: Regular rate and rhythm, S1 and S2 normal, no murmur, rub or gallop Abdomen: Soft, non-tender, nondistended, normoactive bowel sounds, no masses, no hepatosplenomegaly Extremities: No clubbing, cyanosis or edema Pulses: 2+ and symmetric all extremities Skin: Purpuric lesions noted on both forearms. Lymph nodes: Cervical, supraclavicular, and axillary nodes normal Neurologic: CNII-XII intact, normal strength, sensation and gait; reflexes 2+ and symmetric throughout   Psych: Normal mood, affect, hygiene and grooming  ASSESSMENT/PLAN: Routine general medical examination at a health care facility - Plan: CBC with Differential/Platelet, Comprehensive metabolic panel, Lipid panel  Hyperlipidemia LDL goal <130 - Plan: Lipid panel  Essential hypertension - Plan: CBC with Differential/Platelet, Comprehensive metabolic panel  Seasonal allergic rhinitis due to pollen  Arthritis  Pacemaker  Gastroesophageal reflux disease,  unspecified whether esophagitis present  Retinopathy   Constipation, unspecified constipation type  Senile purpura (HCC)  Primary osteoarthritis of left knee  Primary osteoarthritis of right knee  Acute bronchitis, unspecified organism - Plan: amoxicillin-clavulanate (AUGMENTIN) 875-125 MG tablet  Complete heart block (HCC)  HH (hiatus hernia) He is to stop the MiraLAX as well as Linzess.  Not sure if he is still taking that.  Recommend Imodium for the diarrhea.  Explained the fact that Augmentin can certainly end up causing difficulty with diarrhea.  Also explained the fact that this is not clear-cut bronchitis.  There also could be a sinus infection but difficult to say due to his underlying allergies. Continue be followed by cardiology as well as ENT.  He is not interested in the flu shot.  Immunization recommendations discussed.  Colonoscopy recommendations reviewed.   Medicare Attestation I have personally reviewed: The patient's medical and social history Their use of alcohol, tobacco or illicit drugs Their current medications and supplements The patient's functional ability including ADLs,fall risks, home safety risks, cognitive, and hearing and visual impairment Diet and physical activities Evidence for depression or mood disorders  The patient's weight, height, and BMI have been recorded in the chart.  I have made referrals, counseling, and provided education to the patient based on review of the above and I have provided the patient with a written personalized care plan for preventive services.     Jill Alexanders, MD   05/03/2021

## 2021-05-04 LAB — LIPID PANEL
Chol/HDL Ratio: 4.2 ratio (ref 0.0–5.0)
Cholesterol, Total: 138 mg/dL (ref 100–199)
HDL: 33 mg/dL — ABNORMAL LOW (ref >39)
LDL Chol Calc (NIH): 82 mg/dL (ref 0–99)
Triglycerides: 130 mg/dL (ref 0–149)
VLDL Cholesterol Cal: 23 mg/dL (ref 5–40)

## 2021-05-04 LAB — COMPREHENSIVE METABOLIC PANEL
ALT: 151 IU/L — ABNORMAL HIGH (ref 0–44)
AST: 28 IU/L (ref 0–40)
Albumin/Globulin Ratio: 1.6 (ref 1.2–2.2)
Albumin: 3.7 g/dL (ref 3.6–4.6)
Alkaline Phosphatase: 315 IU/L — ABNORMAL HIGH (ref 44–121)
BUN/Creatinine Ratio: 10 (ref 10–24)
BUN: 11 mg/dL (ref 8–27)
Bilirubin Total: 0.6 mg/dL (ref 0.0–1.2)
CO2: 23 mmol/L (ref 20–29)
Calcium: 9 mg/dL (ref 8.6–10.2)
Chloride: 93 mmol/L — ABNORMAL LOW (ref 96–106)
Creatinine, Ser: 1.05 mg/dL (ref 0.76–1.27)
Globulin, Total: 2.3 g/dL (ref 1.5–4.5)
Glucose: 145 mg/dL — ABNORMAL HIGH (ref 70–99)
Potassium: 3.1 mmol/L — ABNORMAL LOW (ref 3.5–5.2)
Sodium: 134 mmol/L (ref 134–144)
Total Protein: 6 g/dL (ref 6.0–8.5)
eGFR: 69 mL/min/{1.73_m2} (ref >59)

## 2021-05-04 LAB — CBC WITH DIFFERENTIAL/PLATELET
Basophils Absolute: 0 10*3/uL (ref 0.0–0.2)
Basos: 0 %
EOS (ABSOLUTE): 0.2 10*3/uL (ref 0.0–0.4)
Eos: 2 %
Hematocrit: 41.6 % (ref 37.5–51.0)
Hemoglobin: 14.1 g/dL (ref 13.0–17.7)
Immature Grans (Abs): 0 10*3/uL (ref 0.0–0.1)
Immature Granulocytes: 0 %
Lymphocytes Absolute: 0.3 10*3/uL — ABNORMAL LOW (ref 0.7–3.1)
Lymphs: 2 %
MCH: 30.2 pg (ref 26.6–33.0)
MCHC: 33.9 g/dL (ref 31.5–35.7)
MCV: 89 fL (ref 79–97)
Monocytes Absolute: 0.8 10*3/uL (ref 0.1–0.9)
Monocytes: 6 %
Neutrophils Absolute: 12.4 10*3/uL — ABNORMAL HIGH (ref 1.4–7.0)
Neutrophils: 90 %
Platelets: 153 10*3/uL (ref 150–450)
RBC: 4.67 x10E6/uL (ref 4.14–5.80)
RDW: 12 % (ref 11.6–15.4)
WBC: 13.8 10*3/uL — ABNORMAL HIGH (ref 3.4–10.8)

## 2021-05-05 ENCOUNTER — Encounter (INDEPENDENT_AMBULATORY_CARE_PROVIDER_SITE_OTHER): Payer: Medicare Other | Admitting: Ophthalmology

## 2021-05-06 ENCOUNTER — Encounter (INDEPENDENT_AMBULATORY_CARE_PROVIDER_SITE_OTHER): Payer: Medicare Other | Admitting: Ophthalmology

## 2021-05-10 ENCOUNTER — Other Ambulatory Visit: Payer: Self-pay

## 2021-05-10 ENCOUNTER — Encounter: Payer: Self-pay | Admitting: Family Medicine

## 2021-05-10 ENCOUNTER — Ambulatory Visit (INDEPENDENT_AMBULATORY_CARE_PROVIDER_SITE_OTHER): Payer: Medicare Other | Admitting: Family Medicine

## 2021-05-10 VITALS — BP 138/80 | HR 85 | Wt 150.4 lb

## 2021-05-10 DIAGNOSIS — I442 Atrioventricular block, complete: Secondary | ICD-10-CM

## 2021-05-10 DIAGNOSIS — R748 Abnormal levels of other serum enzymes: Secondary | ICD-10-CM

## 2021-05-10 DIAGNOSIS — Z950 Presence of cardiac pacemaker: Secondary | ICD-10-CM

## 2021-05-10 LAB — CBC WITH DIFFERENTIAL/PLATELET
Basophils Absolute: 0 10*3/uL (ref 0.0–0.2)
Basos: 0 %
EOS (ABSOLUTE): 0.1 10*3/uL (ref 0.0–0.4)
Eos: 1 %
Hematocrit: 44 % (ref 37.5–51.0)
Hemoglobin: 14.6 g/dL (ref 13.0–17.7)
Immature Grans (Abs): 0 10*3/uL (ref 0.0–0.1)
Immature Granulocytes: 0 %
Lymphocytes Absolute: 0.4 10*3/uL — ABNORMAL LOW (ref 0.7–3.1)
Lymphs: 3 %
MCH: 29.9 pg (ref 26.6–33.0)
MCHC: 33.2 g/dL (ref 31.5–35.7)
MCV: 90 fL (ref 79–97)
Monocytes Absolute: 0.9 10*3/uL (ref 0.1–0.9)
Monocytes: 8 %
Neutrophils Absolute: 10 10*3/uL — ABNORMAL HIGH (ref 1.4–7.0)
Neutrophils: 88 %
Platelets: 296 10*3/uL (ref 150–450)
RBC: 4.88 x10E6/uL (ref 4.14–5.80)
RDW: 12.2 % (ref 11.6–15.4)
WBC: 11.4 10*3/uL — ABNORMAL HIGH (ref 3.4–10.8)

## 2021-05-10 LAB — COMPREHENSIVE METABOLIC PANEL WITH GFR
ALT: 44 IU/L (ref 0–44)
AST: 27 IU/L (ref 0–40)
Albumin/Globulin Ratio: 1.5 (ref 1.2–2.2)
Albumin: 3.8 g/dL (ref 3.6–4.6)
Alkaline Phosphatase: 199 IU/L — ABNORMAL HIGH (ref 44–121)
BUN/Creatinine Ratio: 12 (ref 10–24)
BUN: 12 mg/dL (ref 8–27)
Bilirubin Total: 0.3 mg/dL (ref 0.0–1.2)
CO2: 24 mmol/L (ref 20–29)
Calcium: 9.6 mg/dL (ref 8.6–10.2)
Chloride: 94 mmol/L — ABNORMAL LOW (ref 96–106)
Creatinine, Ser: 1.01 mg/dL (ref 0.76–1.27)
Globulin, Total: 2.6 g/dL (ref 1.5–4.5)
Glucose: 88 mg/dL (ref 70–99)
Potassium: 3.7 mmol/L (ref 3.5–5.2)
Sodium: 136 mmol/L (ref 134–144)
Total Protein: 6.4 g/dL (ref 6.0–8.5)
eGFR: 72 mL/min/1.73

## 2021-05-10 NOTE — Progress Notes (Signed)
   Subjective:    Patient ID: HAKIM MINNIEFIELD, male    DOB: 04-Nov-1931, 87 y.o.   MRN: [id number]  HPI He is here for recheck.  He states that he is feeling better since his last visit specifically less of metallic taste in his mouth.  Previous blood work did show slightly elevated white blood count as well as ALT and alkaline phosphatase.   Review of Systems     Objective:   Physical Exam Alert and in no distress. Tympanic membranes and canals are normal. Pharyngeal area is normal. Neck is supple without adenopathy or thyromegaly. Cardiac exam shows an irregular rhythm without murmurs or gallops. Lungs are clear to auscultation.  EKG read by me shows slightly irregular rhythm with pacer.  Other parameters all look normal with the pacer spike.      Assessment & Plan:  Pacemaker - Plan: EKG 12-Lead  Complete heart block (HCC)  Elevated alkaline phosphatase level - Plan: CBC with Differential/Platelet, Comprehensive metabolic panel  Abnormal liver enzymes - Plan: CBC with Differential/Platelet, Comprehensive metabolic panel

## 2021-05-11 ENCOUNTER — Encounter: Payer: Self-pay | Admitting: Family Medicine

## 2021-05-24 ENCOUNTER — Telehealth: Payer: Self-pay

## 2021-05-24 NOTE — Telephone Encounter (Signed)
Pt states he has a relative coming in from [place] and he wants to know if he should be concerned of anything and what guidelines are and if he should wear a mask while he's around them.   Michela Pitcher he was concerned because of his immune system. Please call pt back and inform of recommendations.

## 2021-05-24 NOTE — Telephone Encounter (Signed)
Left message for pt with Dr. Lanice Shirts recommendations

## 2021-05-25 ENCOUNTER — Telehealth: Payer: Self-pay | Admitting: Physician Assistant

## 2021-05-25 ENCOUNTER — Other Ambulatory Visit: Payer: Self-pay | Admitting: Family Medicine

## 2021-05-25 DIAGNOSIS — L814 Other melanin hyperpigmentation: Secondary | ICD-10-CM | POA: Diagnosis not present

## 2021-05-25 DIAGNOSIS — D225 Melanocytic nevi of trunk: Secondary | ICD-10-CM | POA: Diagnosis not present

## 2021-05-25 DIAGNOSIS — L298 Other pruritus: Secondary | ICD-10-CM | POA: Diagnosis not present

## 2021-05-25 DIAGNOSIS — L400 Psoriasis vulgaris: Secondary | ICD-10-CM | POA: Diagnosis not present

## 2021-05-25 DIAGNOSIS — I10 Essential (primary) hypertension: Secondary | ICD-10-CM

## 2021-05-25 DIAGNOSIS — L821 Other seborrheic keratosis: Secondary | ICD-10-CM | POA: Diagnosis not present

## 2021-05-25 NOTE — Telephone Encounter (Signed)
Telephone call 05/25/21 1456  Patient requested a call via [place].  Was asking about digestive enzymes.  Tells me that he would like to try some to see if it helps with bloating/gas after eating lunch in the afternoons.  He has had this same problem for over the the past year.  Apparently went from eating white bread to Rye bread recently at lunch which gives him less problem.  He wants to try some over-the-counter digestive enzymes prior to meals to see if this helps.  Told him that I do not personally recommend these to any my patients so I do not have a "brand" that I like but we do not typically see many negatives/adverse reactions to these so if he wants to try when he can.  He told me he would let me know how it goes.  Ellouise Newer, PA-C

## 2021-06-03 ENCOUNTER — Ambulatory Visit: Payer: Medicare Other | Admitting: Allergy

## 2021-07-01 DIAGNOSIS — H35371 Puckering of macula, right eye: Secondary | ICD-10-CM | POA: Diagnosis not present

## 2021-07-01 DIAGNOSIS — D3131 Benign neoplasm of right choroid: Secondary | ICD-10-CM | POA: Diagnosis not present

## 2021-07-01 DIAGNOSIS — H35363 Drusen (degenerative) of macula, bilateral: Secondary | ICD-10-CM | POA: Diagnosis not present

## 2021-07-01 DIAGNOSIS — H40013 Open angle with borderline findings, low risk, bilateral: Secondary | ICD-10-CM | POA: Diagnosis not present

## 2021-07-01 LAB — HM DIABETES EYE EXAM

## 2021-07-21 ENCOUNTER — Ambulatory Visit (INDEPENDENT_AMBULATORY_CARE_PROVIDER_SITE_OTHER): Payer: Medicare Other

## 2021-07-21 DIAGNOSIS — I442 Atrioventricular block, complete: Secondary | ICD-10-CM | POA: Diagnosis not present

## 2021-07-21 LAB — CUP PACEART REMOTE DEVICE CHECK
Battery Remaining Longevity: 69 mo
Battery Voltage: 2.97 V
Brady Statistic AP VP Percent: 53.37 %
Brady Statistic AP VS Percent: 0.02 %
Brady Statistic AS VP Percent: 46.06 %
Brady Statistic AS VS Percent: 0.54 %
Brady Statistic RA Percent Paced: 52.7 %
Brady Statistic RV Percent Paced: 99.43 %
Date Time Interrogation Session: 20221228004724
Implantable Lead Implant Date: 20180620
Implantable Lead Implant Date: 20180620
Implantable Lead Location: 753859
Implantable Lead Location: 753860
Implantable Lead Model: 5076
Implantable Lead Model: 5076
Implantable Pulse Generator Implant Date: 20180620
Lead Channel Impedance Value: 285 Ohm
Lead Channel Impedance Value: 342 Ohm
Lead Channel Impedance Value: 361 Ohm
Lead Channel Impedance Value: 399 Ohm
Lead Channel Pacing Threshold Amplitude: 0.5 V
Lead Channel Pacing Threshold Amplitude: 0.875 V
Lead Channel Pacing Threshold Pulse Width: 0.4 ms
Lead Channel Pacing Threshold Pulse Width: 0.4 ms
Lead Channel Sensing Intrinsic Amplitude: 4.75 mV
Lead Channel Sensing Intrinsic Amplitude: 4.75 mV
Lead Channel Sensing Intrinsic Amplitude: 5.125 mV
Lead Channel Sensing Intrinsic Amplitude: 5.125 mV
Lead Channel Setting Pacing Amplitude: 1.5 V
Lead Channel Setting Pacing Amplitude: 2.5 V
Lead Channel Setting Pacing Pulse Width: 0.4 ms
Lead Channel Setting Sensing Sensitivity: 2 mV

## 2021-07-22 ENCOUNTER — Telehealth: Payer: Self-pay | Admitting: Physician Assistant

## 2021-07-22 NOTE — Telephone Encounter (Signed)
Inbound call from patient, states that he has a question about the Miralax he has been taking. Seeking advice, please advise.

## 2021-07-22 NOTE — Telephone Encounter (Signed)
Called and spoke with patient. He states that he has still been trying to figure out the right dose of Miralax to take. Pt states that he has been alternating between soft/loose stools to constipation. Pt states that he has been off of Linzess. Advised pt that he can titrate the Miralax dose, advised that he can try a half a capful every other day and see how that goes. I advised that her can try Align probiotic OTC or Hardin Negus' colon health as previously recommended by Anderson Malta. Pt is aware that he will need to take the probiotic consistently for a couple of weeks before he may notice a big difference. I offered pt an appt, he states that he is fine and will try this first. Pt verbalized understanding and had no concerns at the end of the call.

## 2021-07-30 NOTE — Progress Notes (Signed)
Remote pacemaker transmission.   

## 2021-08-23 ENCOUNTER — Other Ambulatory Visit: Payer: Self-pay | Admitting: Family Medicine

## 2021-08-23 DIAGNOSIS — E785 Hyperlipidemia, unspecified: Secondary | ICD-10-CM

## 2021-08-30 ENCOUNTER — Telehealth: Payer: Self-pay | Admitting: Family Medicine

## 2021-08-30 DIAGNOSIS — L570 Actinic keratosis: Secondary | ICD-10-CM | POA: Diagnosis not present

## 2021-08-30 DIAGNOSIS — L853 Xerosis cutis: Secondary | ICD-10-CM | POA: Diagnosis not present

## 2021-08-30 DIAGNOSIS — L821 Other seborrheic keratosis: Secondary | ICD-10-CM | POA: Diagnosis not present

## 2021-08-30 DIAGNOSIS — L400 Psoriasis vulgaris: Secondary | ICD-10-CM | POA: Diagnosis not present

## 2021-08-30 DIAGNOSIS — D1801 Hemangioma of skin and subcutaneous tissue: Secondary | ICD-10-CM | POA: Diagnosis not present

## 2021-08-30 DIAGNOSIS — R233 Spontaneous ecchymoses: Secondary | ICD-10-CM | POA: Diagnosis not present

## 2021-08-30 NOTE — Telephone Encounter (Signed)
Pt was called and advised he needed labs. Pt called back and schedule with me. Please put labs orders in.

## 2021-09-01 ENCOUNTER — Other Ambulatory Visit: Payer: Medicare Other

## 2021-09-02 ENCOUNTER — Ambulatory Visit (INDEPENDENT_AMBULATORY_CARE_PROVIDER_SITE_OTHER): Payer: Medicare Other | Admitting: Family Medicine

## 2021-09-02 ENCOUNTER — Encounter: Payer: Self-pay | Admitting: Family Medicine

## 2021-09-02 ENCOUNTER — Other Ambulatory Visit: Payer: Self-pay

## 2021-09-02 VITALS — BP 138/72 | HR 62 | Temp 96.6°F | Wt 148.6 lb

## 2021-09-02 DIAGNOSIS — R748 Abnormal levels of other serum enzymes: Secondary | ICD-10-CM | POA: Diagnosis not present

## 2021-09-02 DIAGNOSIS — K581 Irritable bowel syndrome with constipation: Secondary | ICD-10-CM

## 2021-09-02 NOTE — Progress Notes (Signed)
° °  Subjective:    Patient ID: [name], male    DOB: [birth year], 88 y.o.   MRN: [id number]  HPI He is here for follow-up visit.  On the last several blood draws he has had elevated alkaline phosphatase.  He has a history of IBS C and this is being handled mainly with MiraLAX.  He has no abdominal pain, nausea, vomiting.  The MiraLAX is helping keeping him regular.  He has arthritis type symptoms but nothing out of ordinary.   Review of Systems     Objective:   Physical Exam Alert and in no distress otherwise not examined       Assessment & Plan:  Irritable bowel syndrome with constipation - Plan: Comprehensive metabolic panel, CBC with Differential/Platelet  Elevated alkaline phosphatase level I explained that I will follow-up on the alkaline phosphatase and if need be fractionated to see where it might be coming from.  Also discussed possible liver ultrasound and potentially could even have him follow-up with gastroenterology.

## 2021-09-02 NOTE — Progress Notes (Deleted)
Subjective:  [name] is a 88 y.o. male who presents for Chief Complaint  Patient presents with   Follow-up    Elevated alkaline phosphatase level      ***  No other aggravating or relieving factors.    No other c/o.  The following portions of the patient's history were reviewed and updated as appropriate: allergies, current medications, past family history, past medical history, past social history, past surgical history and problem list.  ROS Otherwise as in subjective above  Objective: There were no vitals taken for this visit.  General appearance: alert, no distress, well developed, well nourished HEENT: normocephalic, sclerae anicteric, conjunctiva pink and moist, TMs pearly, nares patent, no discharge or erythema, pharynx normal Oral cavity: MMM, no lesions Neck: supple, no lymphadenopathy, no thyromegaly, no masses Heart: RRR, normal S1, S2, no murmurs Lungs: CTA bilaterally, no wheezes, rhonchi, or rales Abdomen: +bs, soft, non tender, non distended, no masses, no hepatomegaly, no splenomegaly Pulses: 2+ radial pulses, 2+ pedal pulses, normal cap refill Ext: no edema   Assessment: No diagnosis found.   Plan: ***  There are no diagnoses linked to this encounter.  Follow up: ***

## 2021-09-03 LAB — COMPREHENSIVE METABOLIC PANEL
ALT: 17 IU/L (ref 0–44)
AST: 21 IU/L (ref 0–40)
Albumin/Globulin Ratio: 2 (ref 1.2–2.2)
Albumin: 4.7 g/dL — ABNORMAL HIGH (ref 3.6–4.6)
Alkaline Phosphatase: 110 IU/L (ref 44–121)
BUN/Creatinine Ratio: 15 (ref 10–24)
BUN: 15 mg/dL (ref 8–27)
Bilirubin Total: 0.4 mg/dL (ref 0.0–1.2)
CO2: 25 mmol/L (ref 20–29)
Calcium: 10.1 mg/dL (ref 8.6–10.2)
Chloride: 101 mmol/L (ref 96–106)
Creatinine, Ser: 1.01 mg/dL (ref 0.76–1.27)
Globulin, Total: 2.3 g/dL (ref 1.5–4.5)
Glucose: 77 mg/dL (ref 70–99)
Potassium: 3.7 mmol/L (ref 3.5–5.2)
Sodium: 144 mmol/L (ref 134–144)
Total Protein: 7 g/dL (ref 6.0–8.5)
eGFR: 72 mL/min/{1.73_m2} (ref >59)

## 2021-09-03 LAB — CBC WITH DIFFERENTIAL/PLATELET
Basophils Absolute: 0 10*3/uL (ref 0.0–0.2)
Basos: 0 %
EOS (ABSOLUTE): 0.2 10*3/uL (ref 0.0–0.4)
Eos: 3 %
Hematocrit: 47.4 % (ref 37.5–51.0)
Hemoglobin: 15.9 g/dL (ref 13.0–17.7)
Immature Grans (Abs): 0 10*3/uL (ref 0.0–0.1)
Immature Granulocytes: 0 %
Lymphocytes Absolute: 0.7 10*3/uL (ref 0.7–3.1)
Lymphs: 10 %
MCH: 30.8 pg (ref 26.6–33.0)
MCHC: 33.5 g/dL (ref 31.5–35.7)
MCV: 92 fL (ref 79–97)
Monocytes Absolute: 0.7 10*3/uL (ref 0.1–0.9)
Monocytes: 10 %
Neutrophils Absolute: 5.3 10*3/uL (ref 1.4–7.0)
Neutrophils: 77 %
Platelets: 192 10*3/uL (ref 150–450)
RBC: 5.17 x10E6/uL (ref 4.14–5.80)
RDW: 12.2 % (ref 11.6–15.4)
WBC: 6.9 10*3/uL (ref 3.4–10.8)

## 2021-10-05 ENCOUNTER — Ambulatory Visit: Payer: Medicare Other | Admitting: Orthopaedic Surgery

## 2021-10-06 ENCOUNTER — Other Ambulatory Visit: Payer: Self-pay

## 2021-10-06 ENCOUNTER — Ambulatory Visit: Payer: Medicare Other | Admitting: Orthopaedic Surgery

## 2021-10-06 ENCOUNTER — Encounter: Payer: Self-pay | Admitting: Orthopaedic Surgery

## 2021-10-06 ENCOUNTER — Ambulatory Visit (INDEPENDENT_AMBULATORY_CARE_PROVIDER_SITE_OTHER): Payer: Medicare Other

## 2021-10-06 ENCOUNTER — Ambulatory Visit: Payer: Self-pay

## 2021-10-06 DIAGNOSIS — M1712 Unilateral primary osteoarthritis, left knee: Secondary | ICD-10-CM

## 2021-10-06 DIAGNOSIS — M1711 Unilateral primary osteoarthritis, right knee: Secondary | ICD-10-CM

## 2021-10-06 MED ORDER — BUPIVACAINE HCL 0.5 % IJ SOLN
2.0000 mL | INTRAMUSCULAR | Status: AC | PRN
  Administered 2021-10-06: 2 mL via INTRA_ARTICULAR

## 2021-10-06 MED ORDER — LIDOCAINE HCL 1 % IJ SOLN
2.0000 mL | INTRAMUSCULAR | Status: AC | PRN
  Administered 2021-10-06: 2 mL

## 2021-10-06 MED ORDER — METHYLPREDNISOLONE ACETATE 40 MG/ML IJ SUSP
40.0000 mg | INTRAMUSCULAR | Status: AC | PRN
  Administered 2021-10-06: 40 mg via INTRA_ARTICULAR

## 2021-10-06 NOTE — Progress Notes (Addendum)
? ?Office Visit Note ?  ?Patient: [name]           ?Date of Birth: Jan 09, 1932           ?MRN: [id number] ?Visit Date: 10/06/2021 ?             ?Requested by: Denita Lung, MD ?[address] ?[place],  [place] [address] ?PCP: Denita Lung, MD ? ? ?Assessment & Plan: ?Visit Diagnoses:  ?1. Primary osteoarthritis of right knee   ?2. Primary osteoarthritis of left knee   ? ? ?Plan: [name] is here for bilateral knee injection for OA.   He continues to get relief from cortisone injections.  Visco injections did not help. ? ?Examination of bilateral knees is unchanged. ? ?Impression is bilateral knee DJD.  Cortisone injections repeated today.  Follow-up as needed. ? ?Follow-Up Instructions: No follow-ups on file.  ? ?Orders:  ?Orders Placed This Encounter  ?Procedures  ? XR KNEE 3 VIEW LEFT  ? XR KNEE 3 VIEW RIGHT  ? ?No orders of the defined types were placed in this encounter. ? ? ? ? Procedures: ?Large Joint Inj: bilateral knee on 10/06/2021 10:15 AM ?Indications: pain ?Details: 22 G needle ? ?Arthrogram: No ? ?Medications (Right): 2 mL lidocaine 1 %; 2 mL bupivacaine 0.5 %; 40 mg methylPREDNISolone acetate 40 MG/ML ?Medications (Left): 2 mL lidocaine 1 %; 2 mL bupivacaine 0.5 %; 40 mg methylPREDNISolone acetate 40 MG/ML ?Outcome: tolerated well, no immediate complications ?Patient was prepped and draped in the usual sterile fashion.  ? ? ? ? ?Clinical Data: ?No additional findings. ? ? ?Subjective: ?Chief Complaint  ?Patient presents with  ? Right Knee - Pain  ? Left Knee - Pain  ? ? ?HPI ? ?Review of Systems ? ? ?Objective: ?Vital Signs: There were no vitals taken for this visit. ? ?Physical Exam ? ?Ortho Exam ? ?Specialty Comments:  ?No specialty comments available. ? ?Imaging: ?No results found. ? ? ?PMFS History: ?Patient Active Problem List  ? Diagnosis Date Noted  ? Retinopathy  11/05/2020  ? Senile purpura ([place]) 04/30/2020  ? Stage 3a chronic kidney disease ([place]) 04/30/2020  ? Irritable bowel  syndrome with constipation 04/30/2020  ? Primary osteoarthritis of left knee 10/03/2019  ? Primary osteoarthritis of right knee 10/03/2019  ? Gastroesophageal reflux disease 04/29/2019  ? Pacemaker 04/21/2017  ? Complete heart block ([place]) 01/11/2017  ? History of prostate cancer 12/27/2010  ? Arthritis 12/27/2010  ? History of cataract extraction 12/27/2010  ? HH (hiatus hernia)   ? History of colonic polyps   ? Hyperlipidemia LDL goal <130 04/21/2008  ? Allergic rhinitis 04/21/2008  ? Essential hypertension 04/18/2008  ? ?Past Medical History:  ?Diagnosis Date  ? 1st degree AV block   ? Arthritis   ? "hands, back, neck, shoulders, knees" (01/11/2017)  ? Eczema   ? GERD (gastroesophageal reflux disease)   ? HH (hiatus hernia)   ? History of blood transfusion   ? "maybe; w/prostate OR; they would have used my own" (01/11/2017)  ? History of colonic polyps   ? "I don't think I've had any polyps" (01/11/2017)  ? Hypercholesterolemia   ? Hypertension   ? Presence of permanent cardiac pacemaker   ? Prostate cancer ([place]) 1997  ? Seasonal allergies   ? SVT (supraventricular tachycardia) ([place])   ?  ?Family History  ?Problem Relation Age of Onset  ? Cancer Mother   ? Alcoholism Father   ?  ?Past Surgical History:  ?  Procedure Laterality Date  ? AV NODE ABLATION  2000s X 2  ? AVNODE REENTRANT ABLATION  ? CATARACT EXTRACTION W/ INTRAOCULAR LENS  IMPLANT, BILATERAL    ? COLONOSCOPY  2007  ? MAGOD  ? INSERT / REPLACE / REMOVE PACEMAKER  01/11/2017  ? PACEMAKER IMPLANT N/A 01/11/2017  ? Procedure: Pacemaker Implant;  Surgeon: Evans Lance, MD;  Location: [facility];  Service: Cardiovascular;  Laterality: N/A;  ? PROSTATECTOMY    ? SKIN BIOPSY Right 02/04/2019  ? verruca vulgaris, irritated  ? TESTICLE SURGERY Left 2010  ? "benign growth"; KIMBROUGH  ? TONSILLECTOMY    ? ?Social History  ? ?Occupational History  ? Occupation: retired  ?Tobacco Use  ? Smoking status: Former  ?  Packs/day: 1.50  ?  Years: 13.00  ?  Pack years:  19.50  ?  Types: Cigarettes  ?  Quit date: 08/25/1962  ?  Years since quitting: 59.1  ? Smokeless tobacco: Never  ?Vaping Use  ? Vaping Use: Never used  ?Substance and Sexual Activity  ? Alcohol use: No  ? Drug use: No  ? Sexual activity: Yes  ? ? ? ? ? ? ?

## 2021-10-07 ENCOUNTER — Telehealth: Payer: Self-pay | Admitting: Physician Assistant

## 2021-10-07 NOTE — Telephone Encounter (Signed)
Returned call to patient. He has requested to speak with [name] directly. He said that it is not urgent and can wait until she has time. I told him that she is covering the hospital the next 2 days so I am not sure when she will have availability. I asked pt if he could tell me what his concerns were, he stated that he had questions about his probiotics and wanted to go over some results and discuss next steps. I told pt that if he wanted to review past results and next steps he would need an office visit to discuss. He stated "Let's let her [name]) determine that". He stated that if you tell him that he needs an appt that's what he will do. I told him that I will get a message to you.  ?

## 2021-10-07 NOTE — Telephone Encounter (Signed)
Patient called to discuss IBS symptoms. Please advise ?

## 2021-10-07 NOTE — Telephone Encounter (Signed)
Telephone Call ? ?Spoke with patient in regards to probiotic.  Apparently he has been on Align for the past 5 weeks and does not feel like it is really helping that much.  He has been doing a lot of research and thinks he may have another one in mind that may help him better.  Discussed that most recently probiotics are not as recommended as they were in the past, but are not necessarily showing any negative effects, he can try a new probiotic if he would like.  If he does this would recommend that he stay on it for 3 to 4 weeks to see if it is going to help.  Otherwise told him he could discontinue this treatment altogether and see how he does.  He verbalized understanding and thanked me for the call. ? ?Ellouise Newer, PA-C ?

## 2021-10-07 NOTE — Telephone Encounter (Signed)
See alternate telephone encounter for details. ?

## 2021-10-20 ENCOUNTER — Ambulatory Visit (INDEPENDENT_AMBULATORY_CARE_PROVIDER_SITE_OTHER): Payer: Medicare Other

## 2021-10-20 DIAGNOSIS — I442 Atrioventricular block, complete: Secondary | ICD-10-CM

## 2021-10-20 LAB — CUP PACEART REMOTE DEVICE CHECK
Battery Remaining Longevity: 63 mo
Battery Voltage: 2.96 V
Brady Statistic AP VP Percent: 62.33 %
Brady Statistic AP VS Percent: 0.03 %
Brady Statistic AS VP Percent: 37.23 %
Brady Statistic AS VS Percent: 0.41 %
Brady Statistic RA Percent Paced: 61.67 %
Brady Statistic RV Percent Paced: 99.56 %
Date Time Interrogation Session: 20230329050209
Implantable Lead Implant Date: 20180620
Implantable Lead Implant Date: 20180620
Implantable Lead Location: 753859
Implantable Lead Location: 753860
Implantable Lead Model: 5076
Implantable Lead Model: 5076
Implantable Pulse Generator Implant Date: 20180620
Lead Channel Impedance Value: 285 Ohm
Lead Channel Impedance Value: 342 Ohm
Lead Channel Impedance Value: 361 Ohm
Lead Channel Impedance Value: 399 Ohm
Lead Channel Pacing Threshold Amplitude: 0.5 V
Lead Channel Pacing Threshold Amplitude: 1 V
Lead Channel Pacing Threshold Pulse Width: 0.4 ms
Lead Channel Pacing Threshold Pulse Width: 0.4 ms
Lead Channel Sensing Intrinsic Amplitude: 3 mV
Lead Channel Sensing Intrinsic Amplitude: 3 mV
Lead Channel Sensing Intrinsic Amplitude: 5.375 mV
Lead Channel Sensing Intrinsic Amplitude: 5.375 mV
Lead Channel Setting Pacing Amplitude: 1.5 V
Lead Channel Setting Pacing Amplitude: 2.5 V
Lead Channel Setting Pacing Pulse Width: 0.4 ms
Lead Channel Setting Sensing Sensitivity: 2 mV

## 2021-10-25 ENCOUNTER — Telehealth: Payer: Self-pay | Admitting: Physician Assistant

## 2021-10-25 NOTE — Telephone Encounter (Signed)
Patient called questioning if you had any knowledge about the probiotic VSL#3 and wanted your thoughts and opinion on it.  Please call patient and advise.  Thank you. ?

## 2021-10-26 NOTE — Telephone Encounter (Signed)
Yes, please let him know I think it is over the top for him.  It may cause him more problems than good. Would stick to what he is taking or Align or [employer].  ?Thanks-JLL  ? ?Called and spoke with patient regarding Jennifer's recommendations. Pt wanted to make an appt. Pt has been scheduled for a f/u appt with Ellouise Newer, PA-C on Friday, 11/12/21 at 9 am. Pt verbalized understanding and had no concerns at the end of the call. ?

## 2021-10-27 ENCOUNTER — Encounter (INDEPENDENT_AMBULATORY_CARE_PROVIDER_SITE_OTHER): Payer: Medicare Other | Admitting: Ophthalmology

## 2021-11-02 ENCOUNTER — Other Ambulatory Visit: Payer: Self-pay | Admitting: Family Medicine

## 2021-11-02 DIAGNOSIS — I10 Essential (primary) hypertension: Secondary | ICD-10-CM

## 2021-11-02 DIAGNOSIS — E785 Hyperlipidemia, unspecified: Secondary | ICD-10-CM

## 2021-11-02 NOTE — Progress Notes (Signed)
Remote pacemaker transmission.   

## 2021-11-12 ENCOUNTER — Encounter: Payer: Self-pay | Admitting: Physician Assistant

## 2021-11-12 ENCOUNTER — Ambulatory Visit (INDEPENDENT_AMBULATORY_CARE_PROVIDER_SITE_OTHER)
Admission: RE | Admit: 2021-11-12 | Discharge: 2021-11-12 | Disposition: A | Discharge status: Home / Self Care | Payer: Medicare Other | Source: Ambulatory Visit | Attending: Physician Assistant | Admitting: Physician Assistant

## 2021-11-12 ENCOUNTER — Ambulatory Visit: Payer: Medicare Other | Admitting: Physician Assistant

## 2021-11-12 VITALS — BP 122/74 | HR 71 | Ht 65.0 in | Wt 145.6 lb

## 2021-11-12 DIAGNOSIS — R140 Abdominal distension (gaseous): Secondary | ICD-10-CM | POA: Diagnosis not present

## 2021-11-12 DIAGNOSIS — Z8719 Personal history of other diseases of the digestive system: Secondary | ICD-10-CM | POA: Diagnosis not present

## 2021-11-12 DIAGNOSIS — K581 Irritable bowel syndrome with constipation: Secondary | ICD-10-CM

## 2021-11-12 DIAGNOSIS — K5900 Constipation, unspecified: Secondary | ICD-10-CM | POA: Diagnosis not present

## 2021-11-12 DIAGNOSIS — M47816 Spondylosis without myelopathy or radiculopathy, lumbar region: Secondary | ICD-10-CM | POA: Diagnosis not present

## 2021-11-12 NOTE — Patient Instructions (Signed)
Your provider has requested that you have an abdominal x ray before leaving today. Please go to the basement floor to our Radiology department for the test. ? ?If you are age 86 or older, your body mass index should be between 23-30. Your Body mass index is 24.23 kg/m?Marland Kitchen If this is out of the aforementioned range listed, please consider follow up with your Primary Care Provider. ? ?If you are age 20 or younger, your body mass index should be between 19-25. Your Body mass index is 24.23 kg/m?Marland Kitchen If this is out of the aformentioned range listed, please consider follow up with your Primary Care Provider.  ? ?________________________________________________________ ? ?The [facility] providers would like to encourage you to use [facility] to communicate with providers for non-urgent requests or questions.  Due to long hold times on the telephone, sending your provider a message by [facility] may be a faster and more efficient way to get a response.  Please allow 48 business hours for a response.  Please remember that this is for non-urgent requests.  ?_______________________________________________________ ? ?

## 2021-11-12 NOTE — Progress Notes (Signed)
? ?Chief Complaint: Follow-up IBS-C and bloating ? ?HPI: ?   [name] is a an 88 year old male with a past medical history as listed below including reflux and IBS, known to Dr. Henrene Pastor, who returns to clinic today for follow-up of his IBS-C and bloating. ?   05/11/2018 patient had positive SIBO testing.  He went on Xifaxan for 2 weeks and had felt improvement on most days while taking the medicine.  Then about a week after his symptoms returned of bloating and excessive gas. ?   06/15/2018 patient followed with Dr. Henrene Pastor and at that time was tried on Restora probiotic.  Also given a lot of anti-gas education. ?   07/14/2020 patient seen in clinic by me and described problems with constipation for at least 21 years and taking Metamucil daily which worked well for him.  Does describe some gas and bloating occasionally.  He has tried all the probiotics and nothing seems to help.  Also saw dietitian which did not seem to help.  At that time we discussed a trial of Gas-X prior to eating lunch and trying to move his MiraLAX dose to before lunch instead of after. ?   We have spoken on the phone multiple times since patient was seen in 2021.  The last 10/25/2021 when the patient asked about starting VSL #3.  I did not advise this as I thought it would add to his symptoms of gas and bloating.  Recommend that he stick to [employer] or [employer]. ?   09/02/2021 CBC normal.  CMP normal. ?   Today, the patient brings with him all of his notes.  He has continued with the same symptoms over the past at least 4 to 5 years now.  Continues to tell me that he feels his main problem is of an incomplete bowel movement along with excessive bloating and gas.  He continues his MiraLAX capsule in the afternoon around 2/3:00 and may be 3 days out of the week he can have a good solid bowel movement in the morning and feel good for the whole day, but then the other days he will often develop increased gas and lower abdominal cramping/gas  pain after waking up from his afternoon nap and this will lead to sometimes a looser bowel movement later in the evening and leaves him with a decrease in appetite and just generally feeling uncomfortable.  He has tried all of the probiotics and various other regimens with his MiraLAX and nothing has ever really help.  Mentions being tested for SIBO in the past.  Does not recall how he did after treatment. ?   Denies fever, chills, weight loss, blood in his stool or symptoms that awaken him from sleep. ?    ?Past Medical History:  ?Diagnosis Date  ? 1st degree AV block   ? Arthritis   ? "hands, back, neck, shoulders, knees" (01/11/2017)  ? Eczema   ? GERD (gastroesophageal reflux disease)   ? HH (hiatus hernia)   ? History of blood transfusion   ? "maybe; w/prostate OR; they would have used my own" (01/11/2017)  ? History of colonic polyps   ? "I don't think I've had any polyps" (01/11/2017)  ? Hypercholesterolemia   ? Hypertension   ? Presence of permanent cardiac pacemaker   ? Prostate cancer ([place]) 1997  ? Seasonal allergies   ? SVT (supraventricular tachycardia) ([place])   ? ? ?Past Surgical History:  ?Procedure Laterality Date  ? AV NODE  ABLATION  2000s X 2  ? AVNODE REENTRANT ABLATION  ? CATARACT EXTRACTION W/ INTRAOCULAR LENS  IMPLANT, BILATERAL    ? COLONOSCOPY  2007  ? MAGOD  ? INSERT / REPLACE / REMOVE PACEMAKER  01/11/2017  ? PACEMAKER IMPLANT N/A 01/11/2017  ? Procedure: Pacemaker Implant;  Surgeon: Evans Lance, MD;  Location: [facility];  Service: Cardiovascular;  Laterality: N/A;  ? PROSTATECTOMY    ? SKIN BIOPSY Right 02/04/2019  ? verruca vulgaris, irritated  ? TESTICLE SURGERY Left 2010  ? "benign growth"; [place]  ? TONSILLECTOMY    ? ? ?Current Outpatient Medications  ?Medication Sig Dispense Refill  ? acetaminophen (TYLENOL) 650 MG CR tablet Take 650 mg by mouth daily as needed for pain.    ? alclomethasone (ACLOVATE) 0.05 % cream Apply topically 2 (two) times daily. 30 g 3  ? amLODipine  (NORVASC) 5 MG tablet TAKE 1 TABLET BY MOUTH  DAILY 90 tablet 1  ? amoxicillin-clavulanate (AUGMENTIN) 875-125 MG tablet Take 1 tablet by mouth 2 (two) times daily. (Patient not taking: Reported on 09/02/2021) 20 tablet 0  ? aspirin 81 MG tablet Take 81 mg by mouth daily.    ? azelastine (ASTELIN) 0.1 % nasal spray Place 2 sprays into both nostrils 2 (two) times daily. (Patient not taking: Reported on 09/02/2021) 30 mL 5  ? cetirizine (ZYRTEC) 10 MG tablet Take 10 mg by mouth at bedtime.  (Patient not taking: Reported on 09/02/2021)    ? desonide (DESOWEN) 0.05 % cream Apply topically. (Patient not taking: Reported on 09/02/2021)    ? hydrochlorothiazide (HYDRODIURIL) 12.5 MG tablet TAKE 1 TABLET BY MOUTH  DAILY 90 tablet 3  ? ibuprofen (ADVIL,MOTRIN) 200 MG tablet Take 400 mg by mouth daily as needed (pain).    ? ipratropium (ATROVENT) 0.06 % nasal spray Apply 2 sprays each nostril 3-4 times a day as needed for nasal drainage. (Patient not taking: Reported on 09/02/2021) 15 mL 5  ? linaclotide (LINZESS) 72 MCG capsule Take 1 capsule (72 mcg total) by mouth 30-60 minutes before breakfast (Patient not taking: Reported on 09/02/2021) 30 capsule 5  ? omeprazole (PRILOSEC) 20 MG capsule TAKE 1 CAPSULE BY MOUTH  TWICE DAILY BEFORE MEALS 180 capsule 3  ? pravastatin (PRAVACHOL) 40 MG tablet TAKE 1 TABLET BY MOUTH DAILY 90 tablet 1  ? triamcinolone cream (KENALOG) 0.1 % Apply topically 2 (two) times daily. (Patient not taking: Reported on 09/02/2021) 45 g 1  ? ?No current facility-administered medications for this visit.  ? ? ?Allergies as of 11/12/2021 - Review Complete 10/06/2021  ?Allergen Reaction Noted  ? Codeine Nausea And Vomiting   ? ? ?Family History  ?Problem Relation Age of Onset  ? Cancer Mother   ? Alcoholism Father   ? ? ?Social History  ? ?Socioeconomic History  ? Marital status: Married  ?  Spouse name: Not on file  ? Number of children: Not on file  ? Years of education: Not on file  ? Highest education level: Not on  file  ?Occupational History  ? Occupation: retired  ?Tobacco Use  ? Smoking status: Former  ?  Packs/day: 1.50  ?  Years: 13.00  ?  Pack years: 19.50  ?  Types: Cigarettes  ?  Quit date: 08/25/1962  ?  Years since quitting: 59.2  ? Smokeless tobacco: Never  ?Vaping Use  ? Vaping Use: Never used  ?Substance and Sexual Activity  ? Alcohol use: No  ? Drug use:  No  ? Sexual activity: Yes  ?Other Topics Concern  ? Not on file  ?Social History Narrative  ? Originally from [place]  ? ?Social Determinants of Health  ? ?Financial Resource Strain: Not on file  ?Food Insecurity: Not on file  ?Transportation Needs: Not on file  ?Physical Activity: Not on file  ?Stress: Not on file  ?Social Connections: Not on file  ?Intimate Partner Violence: Not on file  ? ? ?Review of Systems:    ?Constitutional: No weight loss, fever or chills ?Cardiovascular: No chest pain ?Respiratory: No SOB  ?Gastrointestinal: See HPI and otherwise negative ? ? Physical Exam:  ?Vital signs: ?BP 122/74   Pulse 71   Ht '5\' 5"'$  (1.651 m)   Wt 145 lb 9.6 oz (66 kg)   SpO2 99%   BMI 24.23 kg/m?   ? ?Constitutional:   Pleasant elderly Caucasian male appears to be in NAD, Well developed, Well nourished, alert and cooperative ?Respiratory: Respirations even and unlabored. Lungs clear to auscultation bilaterally.   No wheezes, crackles, or rhonchi.  ?Cardiovascular: Normal S1, S2. No MRG. Regular rate and rhythm. No peripheral edema, cyanosis or pallor.  ?Gastrointestinal:  Soft, nondistended, nontender. No rebound or guarding. Normal bowel sounds. No appreciable masses or hepatomegaly. ?Rectal:  Not performed.  ?Psychiatric: Demonstrates good judgement and reason without abnormal affect or behaviors. ? ?RELEVANT LABS AND IMAGING: ?CBC ?   ?Component Value Date/Time  ? WBC 6.9 09/02/2021 0951  ? WBC 7.1 04/21/2017 0949  ? RBC 5.17 09/02/2021 0951  ? RBC 5.62 04/21/2017 0949  ? HGB 15.9 09/02/2021 0951  ? HCT 47.4 09/02/2021 0951  ? PLT 192 09/02/2021 0951  ? MCV 92  09/02/2021 0951  ? MCH 30.8 09/02/2021 0951  ? MCH 28.6 04/21/2017 0949  ? MCHC 33.5 09/02/2021 0951  ? MCHC 32.9 04/21/2017 0949  ? RDW 12.2 09/02/2021 0951  ? LYMPHSABS 0.7 09/02/2021 0951  ? MONOABS 665 04/2

## 2021-11-12 NOTE — Progress Notes (Signed)
Noted  

## 2021-11-18 ENCOUNTER — Encounter: Payer: Self-pay | Admitting: Family Medicine

## 2021-11-18 ENCOUNTER — Telehealth (INDEPENDENT_AMBULATORY_CARE_PROVIDER_SITE_OTHER): Payer: Medicare Other | Admitting: Family Medicine

## 2021-11-18 VITALS — BP 122/74 | HR 71 | Wt 145.0 lb

## 2021-11-18 DIAGNOSIS — I442 Atrioventricular block, complete: Secondary | ICD-10-CM

## 2021-11-18 DIAGNOSIS — R531 Weakness: Secondary | ICD-10-CM

## 2021-11-18 NOTE — Progress Notes (Signed)
? ?  Subjective:  ? ? Patient ID: [name], male    DOB: [birth year], 88 y.o.   MRN: [id number] ? ?HPI ?Documentation for virtual audio telecommunications through [place] encounter: ?The patient was located at home. 2 patient identifiers used.  ?The provider was located in the office. ?The patient did consent to this visit and is aware of possible charges through their insurance for this visit. ?The other persons participating in this telemedicine service were none. ?Time 5 on call was 5 minutes and in review of previous records >1 minutes total for counseling and coordination of care. ?This virtual service is not related to other E/M service within previous 7 days.  ?He states that the other day when he got out of bed he felt quite weak in his legs.  He laid back down and then got up again and did use a walking device to help him walk around.  He also then took a shower and had no difficulty with that.  He does not complain of chest pain, shortness of breath Heart rate changes or pain in his legs.  He has not had any more these episodes recently. ? ?Review of Systems ? ?   ?Objective:  ? Physical Exam ?Alert and in no distress otherwise not examined ? ? ? ?   ?Assessment & Plan:  ?Weakness ? ?Complete heart block ([place]) ?I explained that the weakness is hard to figure out exactly what is causing it.  Recommend he pay attention to any other symptoms that he might have.  Explained that he probably does not need aspirin therapy since he does not have an underlying heart attack or neurologic problems. ? ?

## 2021-12-21 ENCOUNTER — Telehealth: Payer: Self-pay | Admitting: Physical Medicine and Rehabilitation

## 2021-12-21 NOTE — Telephone Encounter (Signed)
Pt called and would like to be eval for his back pain.   CB [phone]

## 2021-12-21 NOTE — Telephone Encounter (Signed)
Called pt to get sch. LVM#1  

## 2021-12-22 ENCOUNTER — Encounter: Payer: Self-pay | Admitting: Physical Medicine and Rehabilitation

## 2021-12-22 ENCOUNTER — Ambulatory Visit: Payer: Medicare Other | Admitting: Physical Medicine and Rehabilitation

## 2021-12-22 DIAGNOSIS — M48062 Spinal stenosis, lumbar region with neurogenic claudication: Secondary | ICD-10-CM | POA: Diagnosis not present

## 2021-12-22 DIAGNOSIS — R29898 Other symptoms and signs involving the musculoskeletal system: Secondary | ICD-10-CM | POA: Diagnosis not present

## 2021-12-22 DIAGNOSIS — R32 Unspecified urinary incontinence: Secondary | ICD-10-CM | POA: Diagnosis not present

## 2021-12-22 DIAGNOSIS — R202 Paresthesia of skin: Secondary | ICD-10-CM

## 2021-12-22 NOTE — Progress Notes (Signed)
[name] - 88 y.o. male MRN [id number]  Date of birth: January 23, 1932  Office Visit Note: Visit Date: 12/22/2021 PCP: Denita Lung, MD Referred by: Denita Lung, MD  Subjective: Chief Complaint  Patient presents with   Weakness    Pt reports weakness and parasthesias to bilateral legs.    HPI: [name] is a 88 y.o. male who comes in today for evaluation of new episodic weakness/paraesthesias to bilateral lower extremities and urinary incontinence along with chronic history of back pain and myofascial pain. Patient reports first episode occurred on 11/18/2021, states he was attempting to stand up out of chair when his bilateral lower extremities became weak and numb, he then sat back down in chair, states he was not able to bear weight on his legs.  He felt like the legs were not there.  Second episode occurred on 12/20/2021, states he was standing when his bilateral legs became weak and numb, he then kneeled down on floor with both knees and reports when he got up in the sitting position he noticed that he had urinated on himself with a bout of incontinence. Patient reports both episodes resolved within several minutes and have not reoccurred.  These episodes occurred only in the morning and he was fine during the rest of the day.  Patient denies dizziness/syncope. Patient states during these episodes his legs felt very weak and describes this feeling as a numb/tingling sensation. Patient reports chronic bilateral lower back pain, ongoing for many years, however he reports new symptoms are more concerning to him at this time. Patients CT of lumbar spine from 2019 exhibits 4 mm anterolisthesis at L4-L5, 5 mm anterolisthesis at L5-S1 and moderate central canal stenosis at L4-L5. Patient has underwent multiple lumbar epidural steroid injections in our office over the years with significant relief of pain. Patient denies recent trauma or falls.  He notified his primary care physician Dr.  Jill Alexanders.  He basically said he looked up on his iPad and felt like this could be a TIA but Dr. Redmond School did not think it sounded like that and no further work-up was initiated.  He has a history of prostate cancer as well as kidney disease and irritable bowel syndrome but no history of significant urinary incontinence.  He did not notice any symptoms in the arms or hands.  Review of Systems  Genitourinary:        Pt reports episode of urinary incontinence.   Musculoskeletal:  Positive for back pain.  Neurological:  Positive for tingling and weakness.  All other systems reviewed and are negative. Otherwise per HPI.  Assessment & Plan: Visit Diagnoses:    ICD-10-CM   1. Spinal stenosis of lumbar region with neurogenic claudication  M48.062 CT CERVICAL SPINE WO CONTRAST    CT LUMBAR SPINE WO CONTRAST    Ambulatory referral to Neurology    2. Weakness of both lower extremities  R29.898 CT CERVICAL SPINE WO CONTRAST    CT LUMBAR SPINE WO CONTRAST    Ambulatory referral to Neurology    3. Paresthesia of skin  R20.2 CT CERVICAL SPINE WO CONTRAST    CT LUMBAR SPINE WO CONTRAST    Ambulatory referral to Neurology    4. Urinary incontinence, unspecified type  R32 CT CERVICAL SPINE WO CONTRAST    CT LUMBAR SPINE WO CONTRAST    Ambulatory referral to Neurology       Plan: Findings:  Episodic weakness/paraesthesias to bilateral lower extremities and urinary  incontinence. Patient has had total of 2 episodes total since April, symptoms resolved within several minutes of onset, no reoccurrence of symptoms since. Neurological exam today is unremarkable, no deficits noted, he has good strength noted throughout with no balance or upper motor neuron signs. Patient's clinical presentation and exam are complex, differentials could include worsening central/lateral recess stenosis, dynamic listhesis, or possible neurological event such as TIA or other neurologic pathology. We believe the next step is to  order new CT imaging of cervical and lumbar spine. He is not able to have MRI imaging due to pacemaker. We also feel patient would benefit from referral to neurologist for evaluation, I did place referral to Dr. Sarina Ill at [facility]. We did instruct patient to seek immediate emergency care if he experiences same symptoms that worsen in frequency or duration. We will discuss CT results with patient when imaging is complete. No red flag symptoms noted upon exam today.    Meds & Orders: No orders of the defined types were placed in this encounter.   Orders Placed This Encounter  Procedures   CT CERVICAL SPINE WO CONTRAST   CT LUMBAR SPINE WO CONTRAST   Ambulatory referral to Neurology    Follow-up: Return if symptoms worsen or fail to improve.   Procedures: No procedures performed      Clinical History: EXAM: CT LUMBAR SPINE WITHOUT CONTRAST   TECHNIQUE: Multidetector CT imaging of the lumbar spine was performed without intravenous contrast administration. Multiplanar CT image reconstructions were also generated.   COMPARISON:  Lumbar spine radiographs 02/01/2018   FINDINGS: Segmentation: Normal   Alignment: Mild retrolisthesis L1-2, L2-3, L3-4. Mild anterolisthesis L4-5 approximately 4 mm. 5 mm retrolisthesis L5-S1. Mild lumbar scoliosis.   Vertebrae: Negative for fracture or mass   Paraspinal and other soft tissues: Heavily calcified aorta and iliac arteries without aneurysm. No retroperitoneal adenopathy or mass   Disc levels: T11-12: Bilateral facet degeneration. Negative for stenosis   L1-2: Advanced disc degeneration with disc space narrowing and mild spurring. Mild facet degeneration. Moderate right foraminal stenosis due to spurring   L1-2: Advanced disc degeneration with disc space narrowing. Diffuse disc bulging. Moderate right foraminal encroachment due to vertebral endplate spurring and facet hypertrophy.   L2-3: Advanced disc  degeneration with fusion of the disc space on the right. Mild foraminal stenosis bilaterally.   L3-4: Disc degeneration and spondylosis. Bilateral facet hypertrophy. Severe left foraminal encroachment and mild right foraminal encroachment due to spurring   L4-5: 4 mm anterolisthesis with advanced disc degeneration. Advanced facet hypertrophy bilaterally. Moderate central canal stenosis. Severe subarticular stenosis bilaterally with severe left foraminal encroachment.   L5-S1: 5 mm retrolisthesis with advanced disc degeneration. Diffuse endplate spurring and mild facet hypertrophy. Left-sided facet degeneration and spurring projecting into the spinal canal and likely affecting the left L5 nerve root. Moderate subarticular stenosis and foraminal stenosis bilaterally due to spurring   IMPRESSION: Advanced multilevel degenerative change throughout the lumbar spine with extensive disc space narrowing and spurring. Multilevel foraminal encroachment due to spurring as described above. Moderate central canal stenosis at L4-5.   Atherosclerotic aorta     Electronically Signed   By: Franchot Gallo M.D.   On: 02/19/2018 13:32   He reports that he quit smoking about 59 years ago. His smoking use included cigarettes. He has a 19.50 pack-year smoking history. He has never used smokeless tobacco. No results for input(s): HGBA1C, LABURIC in the last 8760 hours.  Objective:  VS:  HT:  WT:   BMI:     BP:   HR: bpm  TEMP: ( )  RESP:  Physical Exam Vitals and nursing note reviewed.  HENT:     Head: Normocephalic and atraumatic.     Right Ear: External ear normal.     Left Ear: External ear normal.     Nose: Nose normal.     Mouth/Throat:     Mouth: Mucous membranes are moist.  Eyes:     Extraocular Movements: Extraocular movements intact.  Cardiovascular:     Rate and Rhythm: Normal rate.     Pulses: Normal pulses.  Pulmonary:     Effort: Pulmonary effort is normal.  Abdominal:      General: Abdomen is flat. There is no distension.  Musculoskeletal:        General: Tenderness present.     Cervical back: Normal range of motion.     Right lower leg: No edema.     Left lower leg: No edema.     Comments: Pt rises from seated position to standing without difficulty. Good lumbar range of motion. Strong distal strength without clonus, no pain upon palpation of greater trochanters. Sensation intact bilaterally. Walks independently, gait steady. Negative Romberg's.  Negative pronator drift.  No sign of facial droop.  Negative Lhermitte's.  No cogwheeling.  Negative Hoffmann's.  There is no intrinsic hand atrophy.  Skin:    General: Skin is warm and dry.     Capillary Refill: Capillary refill takes less than 2 seconds.  Neurological:     General: No focal deficit present.     Mental Status: He is alert and oriented to person, place, and time.     GCS: GCS eye subscore is 4. GCS verbal subscore is 5. GCS motor subscore is 6.     Sensory: Sensation is intact. No sensory deficit.     Motor: Motor function is intact. No weakness.     Coordination: Coordination is intact.     Gait: Gait is intact. Gait normal.     Deep Tendon Reflexes: Reflexes are normal and symmetric. Reflexes normal.     Reflex Scores:      Brachioradialis reflexes are 2+ on the right side and 2+ on the left side.      Patellar reflexes are 2+ on the right side and 2+ on the left side. Psychiatric:        Mood and Affect: Mood normal.        Behavior: Behavior normal.    Ortho Exam  Imaging: No results found.  Past Medical/Family/Surgical/Social History: Medications & Allergies reviewed per EMR, new medications updated. Patient Active Problem List   Diagnosis Date Noted   Retinopathy  11/05/2020   Senile purpura ([place]) 04/30/2020   Stage 3a chronic kidney disease ([place]) 04/30/2020   Irritable bowel syndrome with constipation 04/30/2020   Primary osteoarthritis of left knee 10/03/2019   Primary  osteoarthritis of right knee 10/03/2019   Gastroesophageal reflux disease 04/29/2019   Pacemaker 04/21/2017   Complete heart block ([place]) 01/11/2017   History of prostate cancer 12/27/2010   Arthritis 12/27/2010   History of cataract extraction 12/27/2010   HH (hiatus hernia)    History of colonic polyps    Hyperlipidemia LDL goal <130 04/21/2008   Allergic rhinitis 04/21/2008   Essential hypertension 04/18/2008   Past Medical History:  Diagnosis Date   1st degree AV block    Arthritis    "hands, back, neck, shoulders, knees" (01/11/2017)  Eczema    GERD (gastroesophageal reflux disease)    HH (hiatus hernia)    History of blood transfusion    "maybe; w/prostate OR; they would have used my own" (01/11/2017)   History of colonic polyps    "I don't think I've had any polyps" (01/11/2017)   Hypercholesterolemia    Hypertension    Presence of permanent cardiac pacemaker    Prostate cancer ([place]) 1997   Seasonal allergies    SVT (supraventricular tachycardia) ([place])    Family History  Problem Relation Age of Onset   Cancer Mother    Alcoholism Father    Past Surgical History:  Procedure Laterality Date   AV NODE ABLATION  2000s X 2   AVNODE REENTRANT ABLATION   CATARACT EXTRACTION W/ INTRAOCULAR LENS  IMPLANT, BILATERAL     COLONOSCOPY  2007   MAGOD   INSERT / REPLACE / REMOVE PACEMAKER  01/11/2017   PACEMAKER IMPLANT N/A 01/11/2017   Procedure: Pacemaker Implant;  Surgeon: Evans Lance, MD;  Location: [facility];  Service: Cardiovascular;  Laterality: N/A;   PROSTATECTOMY     SKIN BIOPSY Right 02/04/2019   verruca vulgaris, irritated   TESTICLE SURGERY Left 2010   "benign growth"; [place] History   Occupational History   Occupation: retired  Tobacco Use   Smoking status: Former    Packs/day: 1.50    Years: 13.00    Pack years: 19.50    Types: Cigarettes    Quit date: 08/25/1962    Years since quitting: 59.3   Smokeless  tobacco: Never  Vaping Use   Vaping Use: Never used  Substance and Sexual Activity   Alcohol use: No   Drug use: No   Sexual activity: Yes

## 2021-12-23 ENCOUNTER — Telehealth: Payer: Self-pay | Admitting: Physical Medicine and Rehabilitation

## 2021-12-23 NOTE — Telephone Encounter (Signed)
Pt called requesting a call back from [facility]. Pt states he lost the number to where Dr. Ernestina Patches referred him to and also has a couple questions. Please call pt at [phone].

## 2021-12-29 DIAGNOSIS — L814 Other melanin hyperpigmentation: Secondary | ICD-10-CM | POA: Diagnosis not present

## 2021-12-29 DIAGNOSIS — L723 Sebaceous cyst: Secondary | ICD-10-CM | POA: Diagnosis not present

## 2021-12-29 DIAGNOSIS — D492 Neoplasm of unspecified behavior of bone, soft tissue, and skin: Secondary | ICD-10-CM | POA: Diagnosis not present

## 2021-12-29 DIAGNOSIS — L578 Other skin changes due to chronic exposure to nonionizing radiation: Secondary | ICD-10-CM | POA: Diagnosis not present

## 2021-12-29 DIAGNOSIS — D225 Melanocytic nevi of trunk: Secondary | ICD-10-CM | POA: Diagnosis not present

## 2021-12-29 DIAGNOSIS — L821 Other seborrheic keratosis: Secondary | ICD-10-CM | POA: Diagnosis not present

## 2021-12-29 DIAGNOSIS — L400 Psoriasis vulgaris: Secondary | ICD-10-CM | POA: Diagnosis not present

## 2022-01-07 ENCOUNTER — Telehealth: Payer: Self-pay | Admitting: Physical Medicine and Rehabilitation

## 2022-01-07 NOTE — Telephone Encounter (Signed)
Pt called requesting a call back from Dr. Ernestina Patches. Pt states he had another episode and Dr Ernestina Patches will understand. Please call pt at [phone].

## 2022-01-12 ENCOUNTER — Ambulatory Visit: Payer: Medicare Other | Admitting: Orthopaedic Surgery

## 2022-01-12 ENCOUNTER — Encounter: Payer: Self-pay | Admitting: Orthopaedic Surgery

## 2022-01-12 DIAGNOSIS — M1711 Unilateral primary osteoarthritis, right knee: Secondary | ICD-10-CM | POA: Diagnosis not present

## 2022-01-12 DIAGNOSIS — M1712 Unilateral primary osteoarthritis, left knee: Secondary | ICD-10-CM

## 2022-01-12 MED ORDER — METHYLPREDNISOLONE ACETATE 40 MG/ML IJ SUSP
40.0000 mg | INTRAMUSCULAR | Status: AC | PRN
  Administered 2022-01-12: 40 mg via INTRA_ARTICULAR

## 2022-01-12 MED ORDER — BUPIVACAINE HCL 0.5 % IJ SOLN
2.0000 mL | INTRAMUSCULAR | Status: AC | PRN
  Administered 2022-01-12: 2 mL via INTRA_ARTICULAR

## 2022-01-12 MED ORDER — LIDOCAINE HCL 1 % IJ SOLN
2.0000 mL | INTRAMUSCULAR | Status: AC | PRN
  Administered 2022-01-12: 2 mL

## 2022-01-12 NOTE — Progress Notes (Signed)
Office Visit Note   Patient: [name]           Date of Birth: 1932-04-01           MRN: [id number] Visit Date: 01/12/2022              Requested by: Joseph Lung, MD [place],  [place] [id number] PCP: Joseph Lung, MD   Assessment & Plan: Visit Diagnoses:  1. Primary osteoarthritis of left knee   2. Primary osteoarthritis of right knee     Plan: [name] is here for cortisone injections.  We ended up just performing the right knee injection because he has a scab on the left knee from recent dermatologic procedure for psoriasis around his knee.  He will come back when this has completely healed.  Follow-Up Instructions: No follow-ups on file.   Orders:  No orders of the defined types were placed in this encounter.  No orders of the defined types were placed in this encounter.     Procedures: Large Joint Inj: R knee on 01/12/2022 4:36 PM Indications: pain Details: 22 G needle  Arthrogram: No  Medications: 40 mg methylPREDNISolone acetate 40 MG/ML; 2 mL lidocaine 1 %; 2 mL bupivacaine 0.5 % Consent was given by the patient. Patient was prepped and draped in the usual sterile fashion.       Clinical Data: No additional findings.   Subjective: Chief Complaint  Patient presents with   Right Knee - Pain   Left Knee - Pain    HPI  Review of Systems   Objective: Vital Signs: There were no vitals taken for this visit.  Physical Exam  Ortho Exam  Specialty Comments:  EXAM: CT LUMBAR SPINE WITHOUT CONTRAST   TECHNIQUE: Multidetector CT imaging of the lumbar spine was performed without intravenous contrast administration. Multiplanar CT image reconstructions were also generated.   COMPARISON:  Lumbar spine radiographs 02/01/2018   FINDINGS: Segmentation: Normal   Alignment: Mild retrolisthesis L1-2, L2-3, L3-4. Mild anterolisthesis L4-5 approximately 4 mm. 5 mm retrolisthesis L5-S1. Mild lumbar scoliosis.    Vertebrae: Negative for fracture or mass   Paraspinal and other soft tissues: Heavily calcified aorta and iliac arteries without aneurysm. No retroperitoneal adenopathy or mass   Disc levels: T11-12: Bilateral facet degeneration. Negative for stenosis   L1-2: Advanced disc degeneration with disc space narrowing and mild spurring. Mild facet degeneration. Moderate right foraminal stenosis due to spurring   L1-2: Advanced disc degeneration with disc space narrowing. Diffuse disc bulging. Moderate right foraminal encroachment due to vertebral endplate spurring and facet hypertrophy.   L2-3: Advanced disc degeneration with fusion of the disc space on the right. Mild foraminal stenosis bilaterally.   L3-4: Disc degeneration and spondylosis. Bilateral facet hypertrophy. Severe left foraminal encroachment and mild right foraminal encroachment due to spurring   L4-5: 4 mm anterolisthesis with advanced disc degeneration. Advanced facet hypertrophy bilaterally. Moderate central canal stenosis. Severe subarticular stenosis bilaterally with severe left foraminal encroachment.   L5-S1: 5 mm retrolisthesis with advanced disc degeneration. Diffuse endplate spurring and mild facet hypertrophy. Left-sided facet degeneration and spurring projecting into the spinal canal and likely affecting the left L5 nerve root. Moderate subarticular stenosis and foraminal stenosis bilaterally due to spurring   IMPRESSION: Advanced multilevel degenerative change throughout the lumbar spine with extensive disc space narrowing and spurring. Multilevel foraminal encroachment due to spurring as described above. Moderate central canal stenosis at L4-5.   Atherosclerotic aorta  Electronically Signed   By: [name] M.D.   On: 02/19/2018 13:32  Imaging: No results found.   PMFS History: Patient Active Problem List   Diagnosis Date Noted   Retinopathy  11/05/2020   Senile purpura ([place])  04/30/2020   Stage 3a chronic kidney disease ([place] ) 04/30/2020   Irritable bowel syndrome with constipation 04/30/2020   Primary osteoarthritis of left knee 10/03/2019   Primary osteoarthritis of right knee 10/03/2019   Gastroesophageal reflux disease 04/29/2019   Pacemaker 04/21/2017   Complete heart block ([place]) 01/11/2017   History of prostate cancer 12/27/2010   Arthritis 12/27/2010   History of cataract extraction 12/27/2010   HH (hiatus hernia)    History of colonic polyps    Hyperlipidemia LDL goal <130 04/21/2008   Allergic rhinitis 04/21/2008   Essential hypertension 04/18/2008   Past Medical History:  Diagnosis Date   1st degree AV block    Arthritis    "hands, back, neck, shoulders, knees" (01/11/2017)   Eczema    GERD (gastroesophageal reflux disease)    HH (hiatus hernia)    History of blood transfusion    "maybe; w/prostate OR; they would have used my own" (01/11/2017)   History of colonic polyps    "I don't think I've had any polyps" (01/11/2017)   Hypercholesterolemia    Hypertension    Presence of permanent cardiac pacemaker    Prostate cancer ([place]) 1997   Seasonal allergies    SVT (supraventricular tachycardia) ([place])     Family History  Problem Relation Age of Onset   Cancer Mother    Alcoholism Father     Past Surgical History:  Procedure Laterality Date   AV NODE ABLATION  2000s X 2   AVNODE REENTRANT ABLATION   CATARACT EXTRACTION W/ INTRAOCULAR LENS  IMPLANT, BILATERAL     COLONOSCOPY  2007   MAGOD   INSERT / REPLACE / REMOVE PACEMAKER  01/11/2017   PACEMAKER IMPLANT N/A 01/11/2017   Procedure: Pacemaker Implant;  Surgeon: Joseph Lance, MD;  Location: [facility];  Service: Cardiovascular;  Laterality: N/A;   PROSTATECTOMY     SKIN BIOPSY Right 02/04/2019   verruca vulgaris, irritated   TESTICLE SURGERY Left 2010   "benign growth"; [name] History   Occupational History   Occupation: retired  Tobacco  Use   Smoking status: Former    Packs/day: 1.50    Years: 13.00    Total pack years: 19.50    Types: Cigarettes    Quit date: 08/25/1962    Years since quitting: 59.4   Smokeless tobacco: Never  Vaping Use   Vaping Use: Never used  Substance and Sexual Activity   Alcohol use: No   Drug use: No   Sexual activity: Yes

## 2022-01-18 ENCOUNTER — Ambulatory Visit
Admission: RE | Admit: 2022-01-18 | Discharge: 2022-01-18 | Disposition: A | Discharge status: Home / Self Care | Payer: Medicare Other | Source: Ambulatory Visit | Attending: Physical Medicine and Rehabilitation | Admitting: Physical Medicine and Rehabilitation

## 2022-01-18 DIAGNOSIS — M542 Cervicalgia: Secondary | ICD-10-CM | POA: Diagnosis not present

## 2022-01-18 DIAGNOSIS — M545 Low back pain, unspecified: Secondary | ICD-10-CM | POA: Diagnosis not present

## 2022-01-19 ENCOUNTER — Telehealth: Payer: Self-pay | Admitting: Physical Medicine and Rehabilitation

## 2022-01-19 ENCOUNTER — Ambulatory Visit (INDEPENDENT_AMBULATORY_CARE_PROVIDER_SITE_OTHER): Payer: Medicare Other

## 2022-01-19 DIAGNOSIS — I442 Atrioventricular block, complete: Secondary | ICD-10-CM | POA: Diagnosis not present

## 2022-01-19 LAB — CUP PACEART REMOTE DEVICE CHECK
Battery Remaining Longevity: 58 mo
Battery Voltage: 2.96 V
Brady Statistic AP VP Percent: 63.15 %
Brady Statistic AP VS Percent: 0.02 %
Brady Statistic AS VP Percent: 36.38 %
Brady Statistic AS VS Percent: 0.45 %
Brady Statistic RA Percent Paced: 62.36 %
Brady Statistic RV Percent Paced: 99.53 %
Date Time Interrogation Session: 20230627202245
Implantable Lead Implant Date: 20180620
Implantable Lead Implant Date: 20180620
Implantable Lead Location: 753859
Implantable Lead Location: 753860
Implantable Lead Model: 5076
Implantable Lead Model: 5076
Implantable Pulse Generator Implant Date: 20180620
Lead Channel Impedance Value: 285 Ohm
Lead Channel Impedance Value: 361 Ohm
Lead Channel Impedance Value: 361 Ohm
Lead Channel Impedance Value: 418 Ohm
Lead Channel Pacing Threshold Amplitude: 0.5 V
Lead Channel Pacing Threshold Amplitude: 0.875 V
Lead Channel Pacing Threshold Pulse Width: 0.4 ms
Lead Channel Pacing Threshold Pulse Width: 0.4 ms
Lead Channel Sensing Intrinsic Amplitude: 2.25 mV
Lead Channel Sensing Intrinsic Amplitude: 2.25 mV
Lead Channel Sensing Intrinsic Amplitude: 4.75 mV
Lead Channel Sensing Intrinsic Amplitude: 4.75 mV
Lead Channel Setting Pacing Amplitude: 1.5 V
Lead Channel Setting Pacing Amplitude: 2.5 V
Lead Channel Setting Pacing Pulse Width: 0.4 ms
Lead Channel Setting Sensing Sensitivity: 2 mV

## 2022-01-19 NOTE — Telephone Encounter (Signed)
Spoke with patient about recent CT scans of both cervical and lumbar spine, no alarming findings in cervical spine that could explain his current "episodes." Lumbar spine CT exhibits advanced high grade spinal canal stenosis at the level of L4-L5 which was considered moderate in 2019. Patient instructed to follow up with Dr. Jaynee Eagles coming up for further recommendations.

## 2022-01-20 ENCOUNTER — Telehealth: Payer: Self-pay | Admitting: Physical Medicine and Rehabilitation

## 2022-01-20 NOTE — Telephone Encounter (Signed)
Pt has some questions about his Ct scan. Please call   [phone]

## 2022-01-21 ENCOUNTER — Telehealth: Payer: Self-pay | Admitting: Physical Medicine and Rehabilitation

## 2022-01-21 NOTE — Telephone Encounter (Signed)
Pt returned call to [facility]. Please call pt at [phone].

## 2022-01-24 ENCOUNTER — Other Ambulatory Visit: Payer: Self-pay | Admitting: Family Medicine

## 2022-01-24 DIAGNOSIS — K449 Diaphragmatic hernia without obstruction or gangrene: Secondary | ICD-10-CM

## 2022-02-02 ENCOUNTER — Encounter: Payer: Self-pay | Admitting: Neurology

## 2022-02-02 ENCOUNTER — Ambulatory Visit: Payer: Medicare Other | Admitting: Neurology

## 2022-02-02 VITALS — BP 159/74 | HR 69 | Ht 65.0 in | Wt 147.0 lb

## 2022-02-02 DIAGNOSIS — G20C Parkinsonism, unspecified: Secondary | ICD-10-CM

## 2022-02-02 DIAGNOSIS — G20 Parkinson's disease: Secondary | ICD-10-CM

## 2022-02-02 DIAGNOSIS — F308 Other manic episodes: Secondary | ICD-10-CM | POA: Diagnosis not present

## 2022-02-02 DIAGNOSIS — M6289 Other specified disorders of muscle: Secondary | ICD-10-CM | POA: Diagnosis not present

## 2022-02-02 DIAGNOSIS — R269 Unspecified abnormalities of gait and mobility: Secondary | ICD-10-CM | POA: Diagnosis not present

## 2022-02-02 NOTE — Progress Notes (Signed)
[facility]    Provider:  Dr Jaynee Eagles Requesting Provider: Lorine Bears, NP Primary Care Provider:  Denita Lung, MD  CC:  imbalance  HPI:  [name] is a 88 y.o. male here as requested by Lorine Bears, NP for low back pain, weakness of legs, spinal stenosis however this is a misunderstanding he is her for neurologic eval for gait disturbance. He s unclear why he is here. I spoke Dr. Erlinda Hong and Jinny Blossom and he is complaining of weakness his legs. He is also complaining of weakness in his legs and low back and in his lower legs. And again [facility] and theyDr. Erlinda Hong and they are treating him for his low back issues and leg weakness. E denies ay lightheadedness, dizziness, or any falls at all. No double vision, no problems in his arms, no lighteadedness, no dizziness or vertigo,he reports weakness in the lower back not arms. No tremors. He reports some stiffness. He has 2 bad knees, no shuffling, no neck pain, no dizziness when he stands up, no syncope.No other focal neurologic deficits, associated symptoms, inciting events or modifiable factors.  Reviewed notes, labs and imaging from outside physicians, which showed : CT LUMBAR reviwed images and agrre 1. Advanced and generalized lumbar spine degeneration with scoliosis and L4-5, L5-S1 listhesis. 2. L4-5 high-grade spinal stenosis. 3. L1-2 right more than left subarticular recess impingement. 4. Left foraminal impingement at L3-4 to L5-S1. 5. Right foraminal impingement at T12-L1 and L5-S1.  CT cervical  IMPRESSION: 1. No acute fracture or traumatic listhesis of the cervical spine. 2. Advanced cervical degenerative disc disease and facet arthropathy, most notably C6-7 and C7-T1.    BUN 15 creat 1.1     Review of Systems: Patient complains of symptoms per HPI as well as the following symptoms falls. Pertinent negatives and positives per HPI. All others negative.   Social History   Socioeconomic  History   Marital status: Married    Spouse name: Not on file   Number of children: Not on file   Years of education: Not on file   Highest education level: Not on file  Occupational History   Occupation: retired  Tobacco Use   Smoking status: Former    Packs/day: 1.50    Years: 13.00    Total pack years: 19.50    Types: Cigarettes    Quit date: 08/25/1962    Years since quitting: 59.4   Smokeless tobacco: Never  Vaping Use   Vaping Use: Never used  Substance and Sexual Activity   Alcohol use: No   Drug use: No   Sexual activity: Yes  Other Topics Concern   Not on file  Social History Narrative   Originally from [place]   Social Determinants of Health   Financial Resource Strain: Not on file  Food Insecurity: Not on file  Transportation Needs: Not on file  Physical Activity: Not on file  Stress: Not on file  Social Connections: Not on file  Intimate Partner Violence: Not on file    Family History  Problem Relation Age of Onset   Cancer Mother    Alcoholism Father     Past Medical History:  Diagnosis Date   1st degree AV block    Arthritis    "hands, back, neck, shoulders, knees" (01/11/2017)   Eczema    GERD (gastroesophageal reflux disease)    HH (hiatus hernia)    History of blood transfusion    "maybe; w/prostate OR;  they would have used my own" (01/11/2017)   History of colonic polyps    "I don't think I've had any polyps" (01/11/2017)   Hypercholesterolemia    Hypertension    Presence of permanent cardiac pacemaker    Prostate cancer ([place]) 1997   Seasonal allergies    SVT (supraventricular tachycardia) ([place])     Patient Active Problem List   Diagnosis Date Noted   Retinopathy  11/05/2020   Senile purpura ([place]) 04/30/2020   Stage 3a chronic kidney disease ([place]) 04/30/2020   Irritable bowel syndrome with constipation 04/30/2020   Primary osteoarthritis of left knee 10/03/2019   Primary osteoarthritis of right knee 10/03/2019   Gastroesophageal reflux  disease 04/29/2019   Pacemaker 04/21/2017   Complete heart block ([place]) 01/11/2017   History of prostate cancer 12/27/2010   Arthritis 12/27/2010   History of cataract extraction 12/27/2010   HH (hiatus hernia)    History of colonic polyps    Hyperlipidemia LDL goal <130 04/21/2008   Allergic rhinitis 04/21/2008   Essential hypertension 04/18/2008    Past Surgical History:  Procedure Laterality Date   AV NODE ABLATION  2000s X 2   AVNODE REENTRANT ABLATION   CATARACT EXTRACTION W/ INTRAOCULAR LENS  IMPLANT, BILATERAL     COLONOSCOPY  2007   MAGOD   INSERT / REPLACE / REMOVE PACEMAKER  01/11/2017   PACEMAKER IMPLANT N/A 01/11/2017   Procedure: Pacemaker Implant;  Surgeon: Evans Lance, MD;  Location: [facility];  Service: Cardiovascular;  Laterality: N/A;   PROSTATECTOMY     SKIN BIOPSY Right 02/04/2019   verruca vulgaris, irritated   TESTICLE SURGERY Left 2010   "benign growth"; [facility]   TONSILLECTOMY      Current Outpatient Medications  Medication Sig Dispense Refill   acetaminophen (TYLENOL) 650 MG CR tablet Take 650 mg by mouth daily as needed for pain.     amLODipine (NORVASC) 5 MG tablet TAKE 1 TABLET BY MOUTH  DAILY 90 tablet 1   aspirin 81 MG tablet Take 81 mg by mouth daily.     hydrochlorothiazide (HYDRODIURIL) 12.5 MG tablet TAKE 1 TABLET BY MOUTH  DAILY 90 tablet 3   ibuprofen (ADVIL,MOTRIN) 200 MG tablet Take 400 mg by mouth daily as needed (pain).     omeprazole (PRILOSEC) 20 MG capsule TAKE 1 CAPSULE BY MOUTH  TWICE DAILY BEFORE MEALS 180 capsule 3   pravastatin (PRAVACHOL) 40 MG tablet TAKE 1 TABLET BY MOUTH DAILY 90 tablet 1   triamcinolone cream (KENALOG) 0.1 % Apply topically 2 (two) times daily. 45 g 1   No current facility-administered medications for this visit.    Allergies as of 02/02/2022 - Review Complete 02/02/2022  Allergen Reaction Noted   Codeine Nausea And Vomiting     Vitals: BP (!) 159/74   Pulse 69   Ht '5\' 5"'$  (1.651 m)    Wt 147 lb (66.7 kg)   BMI 24.46 kg/m  Last Weight:  Wt Readings from Last 1 Encounters:  02/02/22 147 lb (66.7 kg)   Last Height:   Ht Readings from Last 1 Encounters:  02/02/22 '5\' 5"'$  (1.651 m)   **stooped posture   Physical exam: Exam: Gen: NAD, conversant, well nourised,  well groomed                     CV: RRR, no MRG. No Carotid Bruits. No peripheral edema, warm, nontender Eyes: Conjunctivae clear without exudates or hemorrhage  Neuro: Detailed Neurologic  Exam  Speech:    Speech is normal; fluent and spontaneous with normal comprehension.  Cognition:    The patient is oriented to person, place, and time;     recent and remote memory intact;     language fluent;     normal attention, concentration,     fund of knowledge Cranial Nerves:hypomimia    The pupils are equal, round, and reactive to light.  Attempted, pupils too small to visualize fundi visual fields are full to finger confrontation. Extraocular movements are intact. Trigeminal sensation is intact and the muscles of mastication are normal. The face is symmetric. The palate elevates in the midline. Hearing intact. Voice is normal. Shoulder shrug is normal. The tongue has normal motion without fasciculations.   Coordination:    Normal finger to nose and heel to shin. Normal rapid alternating movements.   Gait:    Heel-toe and tandem gait are abnormal ataxia.   Motor Observation:    No asymmetry, no atrophy, and no involuntary movements noted. Tone:    Normal muscle tone.    Posture: hypomimia    Strength:    Strength is V/V in the upper and lower limbs.      Sensation: intact to LT     Reflex Exam:  DTR's:    Deep tendon reflexes in the upper and lower extremities are brisk bilaterally.   Toes:    The toes are equicv bilaterally.   Clonus:    Clonus is absent.    Assessment/Plan:  Patient with stooped, increase UE tone(cogwheeling), slight hypomimia, decreased right arm swing, ataxia.   Patient has significant lumbar degenerative changes, however I do not think this is causing his symptoms, I do think he is is parkinsonian on exam, not classic as he does not have a resting exam but he does have several symptoms.  At this time we should provide a DaTscan to evaluate for Parkinson's disease and also order a CT scan to look for chronic microvascular ischemic changes and other causes of ataxia.  If he does have Parkinson's disease this will save surgical procedures, multiple other procedures, and we can treat him early.    DAT Scan CT head  Orders Placed This Encounter  Procedures   CT HEAD W & WO CONTRAST (5MM)   NM BRAIN DATSCAN TUMOR LOC INFLAM SPECT 1 DAY     Cc: Lorine Bears, NP,  Denita Lung, MD, Dr. Ernestina Patches  [place], [facility] [address] [place], [place] [address]  Phone [phone] Fax [phone]

## 2022-02-02 NOTE — Patient Instructions (Addendum)
CT head DAT Scan for Parkinson's [facility]   Brain DaTscan How to prepare and what to expect What is a brain DaTscan? A brain DaTscan is a nuclear medicine scan. It uses radioactive material to diagnose some diseases of the brain, especially those that cause tremor (shakiness). DaTscan is a brand name for a drug called ioflupane I-123. A brain DaTscan is a form of radiology, because radiation is used to take pictures of the body. This radioactive drug is ordered especially for you. Because of this, we need at least 72 hours' notice if you must cancel or reschedule your scan.   How does the scan work? You will be given a small dose of tracer (radioactive material) through an intravenous (IV) line. This tracer will collect in part of your brain and give off gamma rays. A special camera called a gamma camera will use these rays to produce pictures and measurements of your brain. How do I prepare? Some drugs will affect the results of your brain DaTscan. You will need to stop taking these drugs before your scan. The table on page 2 lists the drugs that need to be stopped, and for how many days before your scan. This list is in alphabetical order by the generic name of the drug. The common brand names are listed beneath the generic name. Please confirm these instructions with your doctor who prescribed the drug. Drugs to Stop Taking Before your scan, stop taking these medicines for the length of time shown: Name of Drug Stop Taking  Amoxapine 4 days before  Benztropine  Cogentin 3 days before  Bupropion (Aplenzin, Budeprion, Voxra, Wellbutrin, Zyban) 48 hours before  Buspirone 15 hours before  Citalopram 24 hours before  Cocaine 6 hours before  Escitalopram 24 hours before  Methamphetamine 24 hours before  Methylphenidate (Concerta, Metadate, Methylin, Ritalin) 20 hours before  Paroxetine 24 hours before  Selegilene 48 hours before  Sertraline 3 days before  If you are breastfeeding, or if  there is any chance you are pregnant, please tell the scheduler or technologist (the person who will help you prepare for your scan). How is the scan done? When you first arrive, we will ask you to drink a small cup of water with potassium iodine in it. This water may have a metallic taste.  An hour after you drink the potassium iodine water, the technologist will inject a small amount of tracer into a vein in your arm or hand through your IV.  You must stay in the department for 30 minutes after the injection.  You will then have a break for 3 hours. It is OK to eat and drink during this break.  You must return to the clinic after this 3-hour break to have images of your brain taken.  Then, 4 hours after you receive your tracer injection, the technologist will take images of your brain with the gamma camera. You will lie flat on the exam table while these images are being taken.  You must not move while the camera is taking pictures. If you move, the pictures will be blurry and may have to be taken again.  Taking the images will take 40 to 45 minutes. Your total time in the imaging room will be about 1 hour.  You may also have a low-dose CT scan of your brain to help confirm any results. A CT scan is another way to take images inside your body.  It will take about 5 hours from the time you  drink the potassium iodine water until the scans are complete. What will I feel during the scan? The technologist will help make you as comfortable as possible on the exam table for the scan.  You may feel some minor discomfort from the IV.  Lying still on the exam table may be hard for some patients.  The camera will be close to your head. This may make you feel confined or uneasy (claustrophobic). Please tell the doctor who referred you for this scan if you know you are claustrophobic. Are there any side effects from the scan? Most of the radioactivity from the tracer will pass out of your body in your urine  or stool. The rest simply goes away over time.  Bad reactions to this scan are very rare. Fewer than 1% of patients (fewer than 1 out of 100) have a bad reaction. Reactions may include headache, nausea, vertigo (dizziness), or dry mouth. How do I get the results? When the test is over, the nuclear medicine doctor will review your images, prepare a written report, and talk with your doctor about the results. Your doctor will then talk with you about the results and your treatment options. If you needed to stop taking any medicines on the day of your scan, ask your doctor when to start taking them again.  The potentially interfering drugs consist of: amoxapine, amphetamine, benztropine, bupropion, buspirone, citalopram, cocaine, mazindol, methamphetamine, methylphenidate, norephedrine, phentermine, escitalopram,  phenylpropanolamine, selegiline, paroxetine, and sertraline   Parkinson's Disease Parkinson's disease is a movement disorder. It is a long-term condition that gets worse over time. Each person with Parkinson's disease is affected differently. This condition limits a person's ability to control movements and move the body normally. The condition can range from mild to severe. Parkinson's disease tends to get worse slowly over several years. What are the causes? Parkinson's disease is caused by a loss of brain cells (neurons) that make a brain chemical called dopamine. Dopamine is needed to control movement. As the condition gets worse, more neurons that make dopamine die. This makes it hard to move or control your movements. The exact cause of the loss of neurons is not known. Genes and the environment may contribute to the cause of Parkinson's disease. What increases the risk? The following factors may make you more likely to develop this condition: Being male. Being age 23 or older. Having a family history of Parkinson's disease. Having had a traumatic brain injury. Having been exposed to  toxins, such as pesticides. Having depression. What are the signs or symptoms? Symptoms of this condition can vary. The main symptoms are related to movement. These include: A tremor or shaking while you are resting. You cannot control the shaking. Stiffness in your arms and legs (rigidity). Slowing of movement. You may lose facial expressions and have trouble making small movements that are needed to button clothing or brush your teeth. An abnormal walk. You may walk with short, shuffling steps. Loss of balance and stability when standing. You may sway, fall backward, and have trouble making turns. Other symptoms include: Mental or cognitive changes, including: Depression or anxiety. Having false beliefs (delusions). Seeing, hearing, or feeling things that do not exist (hallucinations). Trouble speaking or swallowing. Changes in bowel or bladder functions, including constipation, having to go urgently or frequently, or not being able to control your bowel or bladder. Changes in sleep habits, acting out dreams, or trouble sleeping. Depending on the severity of the symptoms, Parkinson's disease may be mild, moderate,  or advanced. Parkinson's disease progression is different for everyone. Some people may not progress to the advanced stage. Mild Parkinson's disease involves: Movement problems that do not affect daily activities. Movement problems on one side of the body. Moderate Parkinson's disease involves: Movement problems on both sides of the body. Slowing of movement. Coordination and balance problems. Advanced Parkinson's disease involves: Extreme difficulty walking. Inability to live alone safely. Signs of dementia, such as having trouble remembering things, doing daily tasks such as getting dressed, and problem solving. How is this diagnosed? This condition is diagnosed by a specialist. A diagnosis may be made based on symptoms, your medical history, and a physical exam. You may  also have brain imaging tests to check for loss of neurons in the brain. How is this treated? There is no cure for Parkinson's disease. Treatment focuses on managing your symptoms. Treatment may include: Medicines. Everyone responds to medicines differently. Your response may change over time. Work with your health care provider to find the best medicines for you. Speech, occupational, and physical therapy. Deep brain stimulation surgery to reduce tremors and other involuntary movements. Follow these instructions at home: Medicines Take over-the-counter and prescription medicines only as told by your health care provider. Avoid taking medicines that can affect thinking, such as pain or sleeping medicines. Eating and drinking Follow instructions from your health care provider about eating or drinking restrictions. Do not drink alcohol. Activity Ask your health care provider if it is safe for you to drive. Do exercises as told by your health care provider or physical therapist. Lifestyle  Install grab bars and railings in your home to prevent falls. Do not use any products that contain nicotine or tobacco. These products include cigarettes, chewing tobacco, and vaping devices, such as e-cigarettes. If you need help quitting, ask your health care provider. Consider joining a support group for people with Parkinson's disease. General instructions Work with your health care provider to know the kind of day-to-day help that you may need and what to do to stay safe. Keep all follow-up visits. This is important. Follow-up visits include any visits with a physical therapist, speech therapist, or occupational therapist. Where to find more information [employer] of Neurological Disorders and Stroke: [employer] [place]: www.parkinson.org Contact a health care provider if: Medicines do not help your symptoms. You are unsteady or have fallen at home. You need more  support to function well at home. You have trouble swallowing. You have severe constipation. You are having problems with side effects from your medicines. You feel confused, anxious, depressed, or have hallucinations. Get help right away if you: Are injured after a fall. Cannot swallow without choking. Have chest pain or trouble breathing. Do not feel safe at home. Have thoughts about hurting yourself or others. These symptoms may represent a serious problem that is an emergency. Do not wait to see if the symptoms will go away. Get medical help right away. Call your local emergency services (911 in the [place] Do not drive yourself to the hospital. If you ever feel like you may hurt yourself or others, or have thoughts about taking your own life, get help right away. Go to your nearest emergency department or: Call your local emergency services (911 in the [place] Call a suicide crisis helpline, such as the [place] at (760) 273-4605 or 988 in the [place]. This is open 24 hours a day in the [place] Text the Crisis Text Line at (917)251-4577 (in the [place].). Summary Parkinson's  disease is a long-term condition that gets worse over time. This condition limits your ability to control your movements and move your body normally. There is no cure for Parkinson's disease. Treatment focuses on managing your symptoms. Work with your health care provider to know the kind of day-to-day help that you may need and what to do to stay safe. Keep all follow-up visits, including any visits with a physical therapist, speech therapist, or occupational therapist. This is important. This information is not intended to replace advice given to you by your health care provider. Make sure you discuss any questions you have with your health care provider. Document Revised: 02/03/2021 Document Reviewed: 10/26/2020 Elsevier Patient Education  [place].

## 2022-02-03 ENCOUNTER — Telehealth: Payer: Self-pay | Admitting: Physical Medicine and Rehabilitation

## 2022-02-03 NOTE — Telephone Encounter (Signed)
Pt called and wants to speak with megan about a visit to a neurologist   CB [phone]

## 2022-02-07 ENCOUNTER — Encounter: Payer: Self-pay | Admitting: Neurology

## 2022-02-07 DIAGNOSIS — G20 Parkinson's disease: Secondary | ICD-10-CM | POA: Insufficient documentation

## 2022-02-08 ENCOUNTER — Telehealth: Payer: Self-pay | Admitting: Neurology

## 2022-02-08 NOTE — Telephone Encounter (Signed)
[employer] medicare [name]: [id number] exp. 02/08/22-03/25/22 sent to [facility] Nuclear Medicine Department

## 2022-02-08 NOTE — Telephone Encounter (Signed)
[employer] medicare NPR sen to GI

## 2022-02-08 NOTE — Progress Notes (Signed)
Remote pacemaker transmission.   

## 2022-02-16 ENCOUNTER — Encounter (INDEPENDENT_AMBULATORY_CARE_PROVIDER_SITE_OTHER): Payer: Medicare Other | Admitting: Ophthalmology

## 2022-02-16 DIAGNOSIS — H35033 Hypertensive retinopathy, bilateral: Secondary | ICD-10-CM | POA: Diagnosis not present

## 2022-02-16 DIAGNOSIS — D3131 Benign neoplasm of right choroid: Secondary | ICD-10-CM

## 2022-02-16 DIAGNOSIS — H43813 Vitreous degeneration, bilateral: Secondary | ICD-10-CM

## 2022-02-16 DIAGNOSIS — H33301 Unspecified retinal break, right eye: Secondary | ICD-10-CM | POA: Diagnosis not present

## 2022-02-16 DIAGNOSIS — I10 Essential (primary) hypertension: Secondary | ICD-10-CM | POA: Diagnosis not present

## 2022-02-16 DIAGNOSIS — H348322 Tributary (branch) retinal vein occlusion, left eye, stable: Secondary | ICD-10-CM | POA: Diagnosis not present

## 2022-02-23 ENCOUNTER — Ambulatory Visit: Payer: Medicare Other | Admitting: Orthopaedic Surgery

## 2022-03-30 ENCOUNTER — Ambulatory Visit (INDEPENDENT_AMBULATORY_CARE_PROVIDER_SITE_OTHER): Payer: Medicare Other | Admitting: Family Medicine

## 2022-03-30 ENCOUNTER — Encounter: Payer: Self-pay | Admitting: Family Medicine

## 2022-03-30 ENCOUNTER — Encounter: Payer: Self-pay | Admitting: Internal Medicine

## 2022-03-30 VITALS — BP 142/76 | HR 68 | Temp 96.8°F | Wt 151.4 lb

## 2022-03-30 DIAGNOSIS — R269 Unspecified abnormalities of gait and mobility: Secondary | ICD-10-CM

## 2022-03-30 DIAGNOSIS — R600 Localized edema: Secondary | ICD-10-CM

## 2022-03-30 NOTE — Progress Notes (Signed)
   Subjective:    Patient ID: [name], male    DOB: Mar 14, 1932, 88 y.o.   MRN: [id number]  HPI He is here for evaluation of swelling in the right leg.  He is not sure when this started.  He has no leg pain, chest pain, shortness of breath.  He has not been sitting for long periods of time or traveling anywhere.  No history of injury. At the end of the encounter he then discussed his visit to neurology.  The neurologist apparently wanted to do further testing to evaluate for Parkinson's disease.  He is not concerned about that since he states he does not think he has enough symptoms that would point towards that.  I did review the Dr. Jaynee Eagles concerning that.  Review of Systems     Objective:   Physical Exam Alert and in no distress.  Exam of the right lower extremity shows a slight 1+ edema.  Bevelyn Buckles' sign is negative.  It is not hot warm or tender.  Exam of the upper thigh area shows no adenopathy.  No adenopathy noted in the inguinal or lower pelvic area.       Assessment & Plan:  Leg edema, right - Plan: VAS [place] LOWER EXTREMITY VENOUS (DVT)  Gait disturbance I will follow-up on the leg after vascular study.  He really does not have any symptoms of DVT but I cannot ignore the unilateral edema. I then discussed the neurology note and concern for Parkinson's disease.  At this time he is not interested in pursuing this.  If he has further neurologic symptoms he will then contact me.

## 2022-04-04 ENCOUNTER — Ambulatory Visit (HOSPITAL_COMMUNITY)
Admission: RE | Admit: 2022-04-04 | Discharge: 2022-04-04 | Disposition: A | Discharge status: Home / Self Care | Payer: Medicare Other | Source: Ambulatory Visit | Attending: Internal Medicine | Admitting: Internal Medicine

## 2022-04-04 DIAGNOSIS — R600 Localized edema: Secondary | ICD-10-CM

## 2022-04-05 ENCOUNTER — Telehealth: Payer: Self-pay | Admitting: Family Medicine

## 2022-04-05 ENCOUNTER — Other Ambulatory Visit: Payer: Self-pay | Admitting: Family Medicine

## 2022-04-05 DIAGNOSIS — R600 Localized edema: Secondary | ICD-10-CM

## 2022-04-05 NOTE — Telephone Encounter (Signed)
I advised pt his blood work was normal but he has further questions and asks for you to call when you can.

## 2022-04-06 ENCOUNTER — Telehealth: Payer: Self-pay

## 2022-04-06 NOTE — Telephone Encounter (Signed)
Pt. Called back stating he wanted to know why a CT was ordered usually he receives a call from [place] first before the imaging place calls to schedule an apt. He just wanted to know why it was ordered and what it was checking for. If you could discuss this with Dr. Redmond School and call him back tomorrow.

## 2022-04-06 NOTE — Telephone Encounter (Signed)
Lvm for pt that JCL might need to take a closer look. [place]

## 2022-04-12 ENCOUNTER — Telehealth: Payer: Self-pay | Admitting: Family Medicine

## 2022-04-12 NOTE — Telephone Encounter (Signed)
Pt called and said that his right big toe is getting worse than his visit from last time it is hurting swollen and he can hardly walk, he wanted you to call him about it, but I made him a appt for tomorrow cause he only wanted mornings, I figured you would want to see what it looked like but he wanted to make sure that he would need to come in for appt

## 2022-04-13 ENCOUNTER — Ambulatory Visit (INDEPENDENT_AMBULATORY_CARE_PROVIDER_SITE_OTHER): Payer: Medicare Other | Admitting: Family Medicine

## 2022-04-13 VITALS — BP 130/70 | HR 60 | Temp 97.2°F | Wt 149.0 lb

## 2022-04-13 DIAGNOSIS — B351 Tinea unguium: Secondary | ICD-10-CM

## 2022-04-13 NOTE — Progress Notes (Signed)
   Subjective:    Patient ID: [name], male    DOB: 1932/07/05, 88 y.o.   MRN: [id number]  HPI He has evidence of onychomycosis of the l right great toe that he says is now causing some swelling and discomfort.   Review of Systems     Objective:   Physical Exam Exam of the right great toe does show thickening of the nail however no other toes are involved.  There is also some slight erythema in that area.       Assessment & Plan:  Onychomycosis - Plan: Ambulatory referral to Podiatry I think that the best and easiest way to handle this is referral to podiatry

## 2022-04-19 ENCOUNTER — Telehealth: Payer: Self-pay | Admitting: Family Medicine

## 2022-04-19 MED ORDER — TRIAMCINOLONE ACETONIDE 0.1 % EX CREA: TOPICAL_CREAM | Freq: Two times a day (BID) | CUTANEOUS | 1 refills | Status: DC

## 2022-04-19 NOTE — Telephone Encounter (Signed)
Pt called and states got notice from [facility] his Alclometasone Dipropionate was denied & he doesn't know why, said he's been using that for 50 years for his nose & would like it refilled to [facility]

## 2022-04-20 ENCOUNTER — Telehealth: Payer: Self-pay | Admitting: Family Medicine

## 2022-04-20 ENCOUNTER — Ambulatory Visit (INDEPENDENT_AMBULATORY_CARE_PROVIDER_SITE_OTHER): Payer: Medicare Other

## 2022-04-20 DIAGNOSIS — I442 Atrioventricular block, complete: Secondary | ICD-10-CM | POA: Diagnosis not present

## 2022-04-20 LAB — CUP PACEART REMOTE DEVICE CHECK
Battery Remaining Longevity: 53 mo
Battery Voltage: 2.95 V
Brady Statistic AP VP Percent: 60.35 %
Brady Statistic AP VS Percent: 0.03 %
Brady Statistic AS VP Percent: 38.99 %
Brady Statistic AS VS Percent: 0.62 %
Brady Statistic RA Percent Paced: 59.63 %
Brady Statistic RV Percent Paced: 99.35 %
Date Time Interrogation Session: 20230927070258
Implantable Lead Implant Date: 20180620
Implantable Lead Implant Date: 20180620
Implantable Lead Location: 753859
Implantable Lead Location: 753860
Implantable Lead Model: 5076
Implantable Lead Model: 5076
Implantable Pulse Generator Implant Date: 20180620
Lead Channel Impedance Value: 266 Ohm
Lead Channel Impedance Value: 342 Ohm
Lead Channel Impedance Value: 342 Ohm
Lead Channel Impedance Value: 399 Ohm
Lead Channel Pacing Threshold Amplitude: 0.5 V
Lead Channel Pacing Threshold Amplitude: 0.875 V
Lead Channel Pacing Threshold Pulse Width: 0.4 ms
Lead Channel Pacing Threshold Pulse Width: 0.4 ms
Lead Channel Sensing Intrinsic Amplitude: 3.5 mV
Lead Channel Sensing Intrinsic Amplitude: 3.5 mV
Lead Channel Sensing Intrinsic Amplitude: 3.5 mV
Lead Channel Sensing Intrinsic Amplitude: 3.5 mV
Lead Channel Setting Pacing Amplitude: 1.5 V
Lead Channel Setting Pacing Amplitude: 2.5 V
Lead Channel Setting Pacing Pulse Width: 0.4 ms
Lead Channel Setting Sensing Sensitivity: 2 mV

## 2022-04-20 NOTE — Telephone Encounter (Signed)
Pt called and states got notice from [facility] his Alclometasone Dipropionate was denied & he doesn't know why, said he's been using that for 50 years for his nose & would like it refilled to [facility]

## 2022-04-25 ENCOUNTER — Encounter: Payer: Self-pay | Admitting: Podiatry

## 2022-04-25 ENCOUNTER — Ambulatory Visit: Payer: Medicare Other | Admitting: Podiatry

## 2022-04-25 DIAGNOSIS — L03031 Cellulitis of right toe: Secondary | ICD-10-CM | POA: Diagnosis not present

## 2022-04-25 DIAGNOSIS — M79675 Pain in left toe(s): Secondary | ICD-10-CM

## 2022-04-25 DIAGNOSIS — I999 Unspecified disorder of circulatory system: Secondary | ICD-10-CM

## 2022-04-25 DIAGNOSIS — B351 Tinea unguium: Secondary | ICD-10-CM | POA: Diagnosis not present

## 2022-04-25 DIAGNOSIS — M79674 Pain in right toe(s): Secondary | ICD-10-CM | POA: Diagnosis not present

## 2022-04-25 NOTE — Progress Notes (Signed)
Subjective:   Patient ID: [name], male   DOB: 88 y.o.   MRN: [id number]   HPI Patient presents stating concerned about his right big toenail that there is been some redness in the toe and the did take an antibiotic which has helped but he is got thickness of the nailbed and has other nails that are also thick he cannot cut and they get sore.  He also has developed some swelling in his right lower leg and did have venous studies done which were negative for blood clot.  Patient does not smoke likes to be active   Review of Systems  All other systems reviewed and are negative.       Objective:  Physical Exam Vitals and nursing note reviewed.  Constitutional:      Appearance: He is well-developed.  Pulmonary:     Effort: Pulmonary effort is normal.  Musculoskeletal:        General: Normal range of motion.  Skin:    General: Skin is warm.  Neurological:     Mental Status: He is alert.     Neurovascular status intact muscle strength found to be adequate range of motion adequate.  Patient has moderate diminishment of vascular status with diminished DP PT pulses but intact and no claudication when questioned.  Has thick yellow brittle nailbeds 1-5 both feet that can become painful and he did have a previous infection right which appears resolved with no erythema currently in the toe or edema and no discomfort or drainage.     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  All questions answered today continue to pursue swelling in the right lower leg hopefully will resolve on its own and I  recommended compression socks

## 2022-04-26 NOTE — Telephone Encounter (Signed)
Not sure what he is talking about

## 2022-04-27 ENCOUNTER — Other Ambulatory Visit: Payer: Self-pay | Admitting: Family Medicine

## 2022-04-27 DIAGNOSIS — I10 Essential (primary) hypertension: Secondary | ICD-10-CM

## 2022-04-27 NOTE — Telephone Encounter (Signed)
Lvm for pt to find out if he needs amlodipine to last until his appt 05/10/22. [name]

## 2022-04-28 NOTE — Telephone Encounter (Signed)
Lvm for pt to call back and advise if he needs before his appt 05/10/22

## 2022-04-28 NOTE — Progress Notes (Signed)
Remote pacemaker transmission.   

## 2022-05-02 ENCOUNTER — Ambulatory Visit
Admission: RE | Admit: 2022-05-02 | Discharge: 2022-05-02 | Disposition: A | Discharge status: Home / Self Care | Payer: Medicare Other | Source: Ambulatory Visit | Attending: Family Medicine | Admitting: Family Medicine

## 2022-05-02 DIAGNOSIS — K449 Diaphragmatic hernia without obstruction or gangrene: Secondary | ICD-10-CM | POA: Diagnosis not present

## 2022-05-02 DIAGNOSIS — K828 Other specified diseases of gallbladder: Secondary | ICD-10-CM | POA: Diagnosis not present

## 2022-05-02 DIAGNOSIS — N281 Cyst of kidney, acquired: Secondary | ICD-10-CM | POA: Diagnosis not present

## 2022-05-02 DIAGNOSIS — K429 Umbilical hernia without obstruction or gangrene: Secondary | ICD-10-CM | POA: Diagnosis not present

## 2022-05-02 MED ORDER — IOPAMIDOL (ISOVUE-300) INJECTION 61%
100.0000 mL | Freq: Once | INTRAVENOUS | Status: AC | PRN
  Administered 2022-05-02: 100 mL via INTRAVENOUS

## 2022-05-03 ENCOUNTER — Encounter: Payer: Self-pay | Admitting: Internal Medicine

## 2022-05-03 DIAGNOSIS — I700 Atherosclerosis of aorta: Secondary | ICD-10-CM | POA: Insufficient documentation

## 2022-05-03 HISTORY — DX: Atherosclerosis of aorta: I70.0

## 2022-05-10 ENCOUNTER — Telehealth: Payer: Self-pay | Admitting: Family Medicine

## 2022-05-10 ENCOUNTER — Encounter: Payer: Self-pay | Admitting: Family Medicine

## 2022-05-10 ENCOUNTER — Ambulatory Visit (INDEPENDENT_AMBULATORY_CARE_PROVIDER_SITE_OTHER): Payer: Medicare Other | Admitting: Family Medicine

## 2022-05-10 VITALS — BP 132/74 | HR 68 | Ht 65.5 in | Wt 149.0 lb

## 2022-05-10 DIAGNOSIS — Z950 Presence of cardiac pacemaker: Secondary | ICD-10-CM | POA: Diagnosis not present

## 2022-05-10 DIAGNOSIS — K581 Irritable bowel syndrome with constipation: Secondary | ICD-10-CM

## 2022-05-10 DIAGNOSIS — D692 Other nonthrombocytopenic purpura: Secondary | ICD-10-CM

## 2022-05-10 DIAGNOSIS — I10 Essential (primary) hypertension: Secondary | ICD-10-CM

## 2022-05-10 DIAGNOSIS — H3500 Unspecified background retinopathy: Secondary | ICD-10-CM

## 2022-05-10 DIAGNOSIS — Z0000 Encounter for general adult medical examination without abnormal findings: Secondary | ICD-10-CM | POA: Diagnosis not present

## 2022-05-10 DIAGNOSIS — M1990 Unspecified osteoarthritis, unspecified site: Secondary | ICD-10-CM

## 2022-05-10 DIAGNOSIS — Z23 Encounter for immunization: Secondary | ICD-10-CM | POA: Diagnosis not present

## 2022-05-10 DIAGNOSIS — Z9849 Cataract extraction status, unspecified eye: Secondary | ICD-10-CM

## 2022-05-10 DIAGNOSIS — J301 Allergic rhinitis due to pollen: Secondary | ICD-10-CM

## 2022-05-10 DIAGNOSIS — I700 Atherosclerosis of aorta: Secondary | ICD-10-CM | POA: Diagnosis not present

## 2022-05-10 DIAGNOSIS — I442 Atrioventricular block, complete: Secondary | ICD-10-CM | POA: Diagnosis not present

## 2022-05-10 DIAGNOSIS — L409 Psoriasis, unspecified: Secondary | ICD-10-CM

## 2022-05-10 DIAGNOSIS — Z8546 Personal history of malignant neoplasm of prostate: Secondary | ICD-10-CM

## 2022-05-10 DIAGNOSIS — K219 Gastro-esophageal reflux disease without esophagitis: Secondary | ICD-10-CM

## 2022-05-10 DIAGNOSIS — M1711 Unilateral primary osteoarthritis, right knee: Secondary | ICD-10-CM

## 2022-05-10 DIAGNOSIS — E785 Hyperlipidemia, unspecified: Secondary | ICD-10-CM

## 2022-05-10 DIAGNOSIS — M1712 Unilateral primary osteoarthritis, left knee: Secondary | ICD-10-CM

## 2022-05-10 DIAGNOSIS — B351 Tinea unguium: Secondary | ICD-10-CM

## 2022-05-10 DIAGNOSIS — G20C Parkinsonism, unspecified: Secondary | ICD-10-CM | POA: Diagnosis not present

## 2022-05-10 MED ORDER — HYDROCHLOROTHIAZIDE 12.5 MG PO TABS: 12.5000 mg | ORAL_TABLET | Freq: Every day | ORAL | 3 refills | Status: DC

## 2022-05-10 MED ORDER — AMLODIPINE BESYLATE 5 MG PO TABS: 5.0000 mg | ORAL_TABLET | Freq: Every day | ORAL | 3 refills | Status: DC

## 2022-05-10 MED ORDER — PRAVASTATIN SODIUM 40 MG PO TABS: 40.0000 mg | ORAL_TABLET | Freq: Every day | ORAL | 3 refills | Status: DC

## 2022-05-10 NOTE — Progress Notes (Signed)
[name] is a 88 y.o. male who presents for annual wellness visit ,CPE and follow-up on chronic medical conditions.  He continues to have difficulty with right leg edema.  He has had a Doppler study done as well as CTA of abdomen and pelvis all of which was negative.  He is not having any pain from this but does note some swelling.  He has seen podiatry in the past and does have onychomycosis but it is not causing any difficulty.  He does have a pacer pleasant which is working fine.  He follows up regularly with cardiology concerning this.  He does have issues with GERD and IBS and seems to have this under fairly good control with food avoidance and using MiraLAX.  His allergies seem to be under fairly good control.  He does have arthritis and I recommend using Tylenol for relief of that.  He follows up regularly with ophthalmology concerning cataracts and extraction.  He has a remote history of prostate cancer.  He does have psoriasis and wants to follow-up with dermatology concerning better care of this.  He has seen neurology in the past for neurologic issues and was told he had early Parkinson's disease but presently is on no medication.  He also has x-ray evidence of aortic atherosclerosis.   Immunizations and Health Maintenance Immunization History  Administered Date(s) Administered   PFIZER(Purple Top)SARS-COV-2 Vaccination 08/13/2019, 09/02/2019, 04/30/2020   Pfizer Covid-19 Vaccine Bivalent Booster 82yr & up 05/19/2021   Pneumococcal Conjugate-13 01/20/2014   Pneumococcal Polysaccharide-23 01/03/2012   Tdap 05/25/2000, 01/03/2012   Zoster Recombinat (Shingrix) 05/11/2017, 10/26/2017   Health Maintenance Due  Topic Date Due   COVID-19 Vaccine (5 - Pfizer series) 09/19/2021   TETANUS/TDAP  01/02/2022   INFLUENZA VACCINE  Never done    Last colonoscopy:10/18/2004- Dr. MWatt ClimesLast PSA: 1997 has prostate removed Dentist: Dr. MLuretha RuedOphtho: [name] Retinal Spec: Dr.  MZigmund DanielExercise: 15 min exercise every am routine (back stretches) 30 during the day (knees and other areas)  Other doctors caring for patient include: GI:[name] Derm:Dr. GPearline CablesPod:[name]:Dr. XErlinda HongCard:Dr. TLovena Ashley: Dr. AJaynee Ashley Advanced Directives: Does Patient Have a Medical Advance Directive?: Yes Type of Advance Directive: Healthcare Power of Attorney, Living will, Out of facility DNR (pink MOST or yellow form) Does patient want to make changes to medical advance directive?: No - Patient declined Copy of [place] Chart?: Yes - validated most recent copy scanned in chart (See row information)  Depression screen:  See questionnaire below.        05/10/2022    9:30 AM 05/03/2021    9:41 AM 04/30/2020    9:29 AM 04/29/2019    9:13 AM 04/23/2018    9:05 AM  Depression screen PHQ 2/9  Decreased Interest 0 0 0 0 0  Down, Depressed, Hopeless 0 0 0 0 0  PHQ - 2 Score 0 0 0 0 0    Fall Screen: See Questionaire below.      05/10/2022    9:30 AM 05/03/2021    9:41 AM 04/30/2020    9:29 AM 04/29/2019    9:12 AM 04/23/2018    9:05 AM  Fall Risk   Falls in the past year? 0 0 0 0 No  Number falls in past yr: 0 0     Injury with Fall? 0 0     Risk for fall due to : No Fall Risks No Fall Risks  Follow up Falls evaluation completed Falls evaluation completed       ADL screen:  See questionnaire below.  Functional Status Survey: Is the patient deaf or have difficulty hearing?: Yes (has B/L hearing aides) Does the patient have difficulty seeing, even when wearing glasses/contacts?: No Does the patient have difficulty concentrating, remembering, or making decisions?: No Does the patient have difficulty walking or climbing stairs?: Yes (knee pain) Does the patient have difficulty dressing or bathing?: No Does the patient have difficulty doing errands alone such as visiting a doctor's office or shopping?: No   Review of  Systems  Constitutional: -, -unexpected weight change, -anorexia, -fatigue Allergy: -sneezing, -itching, -congestion Dermatology: denies changing moles, rash, lumps ENT: -runny nose, -ear pain, -sore throat,  Cardiology:  -chest pain, -palpitations, -orthopnea, Respiratory: -cough, -shortness of breath, -dyspnea on exertion, -wheezing,  Gastroenterology: -abdominal pain, -nausea, -vomiting, -diarrhea, -constipation, -dysphagia Hematology: -bleeding or bruising problems Musculoskeletal: -arthralgias, -myalgias, -joint swelling, -back pain, - Ophthalmology: -vision changes,  Urology: -dysuria, -difficulty urinating,  -urinary frequency, -urgency, incontinence Neurology: -, -numbness, , -memory loss, -falls, -dizziness    PHYSICAL EXAM:  BP 132/74   Pulse 68   Ht 5' 5.5" (1.664 m)   Wt 149 lb (67.6 kg)   BMI 24.42 kg/m   General Appearance: Alert, cooperative, no distress, appears stated age Head: Normocephalic, without obvious abnormality, atraumatic Eyes: PERRL, conjunctiva/corneas clear, EOM's intact, fundi benign Ears: Normal TM's and external ear canals Nose: Nares normal, mucosa normal, no drainage or sinus   tenderness Throat: Lips, mucosa, and tongue normal; teeth and gums normal Neck: Supple, no lymphadenopathy, thyroid:no enlargement/tenderness/nodules; no carotid bruit or JVD Lungs: Clear to auscultation bilaterally without wheezes, rales or ronchi; respirations unlabored Heart: Regular rate and rhythm, S1 and S2 normal, no murmur, rub or gallop Abdomen: Soft, non-tender, nondistended, normoactive bowel sounds, no masses, no hepatosplenomegaly Extremities: No clubbing, cyanosis or edema Pulses: 2+ and symmetric all extremities Skin: Skin color, texture, turgor normal, no rashes or lesions Lymph nodes: Cervical, supraclavicular, and axillary nodes normal Neurologic: CNII-XII intact, normal strength, sensation and gait; reflexes 2+ and symmetric throughout   Psych:  Normal mood, affect, hygiene and grooming  ASSESSMENT/PLAN: Aortic atherosclerosis (HCC)  Complete heart block (HCC)  Essential hypertension  Senile purpura (HCC)  Seasonal allergic rhinitis due to pollen  Gastroesophageal reflux disease, unspecified whether esophagitis present  Irritable bowel syndrome with constipation  Parkinsonism, unspecified Parkinsonism type  Pacemaker  Need for COVID-19 vaccine - Plan: PFIZER Comirnaty(GRAY TOP)COVID-19 Vaccine  He will continue to be followed by cardiology.  He will continue to treat his IBS symptoms with MiraLAX and food avoidance.  Referral to dermatology for further evaluation and treatment of his psoriasis.  Discussed using support stockings especially on the right and he will consider this or if he has worsening symptoms he will call.  He is to use Tylenol for arthritis pains.  Continue to treat with OTC medicines for his allergies.  . Immunization recommendations discussed.  Colonoscopy recommendations reviewed.   Medicare Attestation I have personally reviewed: The patient's medical and social history Their use of alcohol, tobacco or illicit drugs Their current medications and supplements The patient's functional ability including ADLs,fall risks, home safety risks, cognitive, and hearing and visual impairment Diet and physical activities Evidence for depression or mood disorders  The patient's weight, height, and BMI have been recorded in the chart.  I have made referrals, counseling, and provided education to the patient based on review of the above and  I have provided the patient with a written personalized care plan for preventive services.     Jill Alexanders, MD   05/10/2022

## 2022-05-10 NOTE — Patient Instructions (Signed)
Use Rhinocort for allergies

## 2022-05-10 NOTE — Telephone Encounter (Signed)
Pt called states you mentioned a couple things to him for sinus but can't remember what they were, one was Zyrtec but can't remember the other

## 2022-05-11 ENCOUNTER — Telehealth: Payer: Self-pay | Admitting: Family Medicine

## 2022-05-11 LAB — LIPID PANEL
Chol/HDL Ratio: 3 ratio (ref 0.0–5.0)
Cholesterol, Total: 201 mg/dL — ABNORMAL HIGH (ref 100–199)
HDL: 66 mg/dL (ref >39)
LDL Chol Calc (NIH): 115 mg/dL — ABNORMAL HIGH (ref 0–99)
Triglycerides: 116 mg/dL (ref 0–149)
VLDL Cholesterol Cal: 20 mg/dL (ref 5–40)

## 2022-05-11 NOTE — Telephone Encounter (Signed)
Pt would like you to go over his lipid results with him.

## 2022-05-11 NOTE — Telephone Encounter (Signed)
Pt. Called back he did not want to make changes to his cholesterol medications because he feels like his numbers have not changed that much. I told him last year his cholesterol was much lower and he said all the other times it was checked it was around 200 on his total and LDL has always been around 100.

## 2022-05-16 ENCOUNTER — Encounter: Payer: Self-pay | Admitting: Internal Medicine

## 2022-05-17 ENCOUNTER — Other Ambulatory Visit: Payer: Self-pay | Admitting: Family Medicine

## 2022-05-17 DIAGNOSIS — E785 Hyperlipidemia, unspecified: Secondary | ICD-10-CM

## 2022-05-19 ENCOUNTER — Encounter: Payer: Self-pay | Admitting: Family Medicine

## 2022-05-20 ENCOUNTER — Telehealth: Payer: Self-pay | Admitting: Family Medicine

## 2022-05-20 DIAGNOSIS — I700 Atherosclerosis of aorta: Secondary | ICD-10-CM

## 2022-05-20 MED ORDER — ROSUVASTATIN CALCIUM 20 MG PO TABS: 20.0000 mg | ORAL_TABLET | Freq: Every day | ORAL | 3 refills | Status: DC

## 2022-05-20 NOTE — Telephone Encounter (Signed)
Pt called and seem very concerned about switching his cholesterol medication. He doesn't understand why? Pt is requesting a explanation as to why his medication is being changed. Please advise.

## 2022-05-20 NOTE — Telephone Encounter (Signed)
The LDL cholesterol is not as low as I would like.  I will switch him to Crestor and have him stop the Pravachol.

## 2022-05-25 ENCOUNTER — Other Ambulatory Visit: Payer: Self-pay | Admitting: Family Medicine

## 2022-05-25 DIAGNOSIS — I10 Essential (primary) hypertension: Secondary | ICD-10-CM

## 2022-05-31 ENCOUNTER — Telehealth: Payer: Self-pay | Admitting: Family Medicine

## 2022-05-31 NOTE — Telephone Encounter (Signed)
Pt called in and wants to know if you have any over the counter recommendations for his allergies he has through out the year. States he currently takes zyrtec every night but doesn't seem to be working well. I did try to make him an appointment but he declined and wanted me to send a message back.

## 2022-06-12 ENCOUNTER — Other Ambulatory Visit: Payer: Self-pay | Admitting: Family Medicine

## 2022-06-13 NOTE — Telephone Encounter (Signed)
Is this okay to refill? 

## 2022-06-30 DIAGNOSIS — H40013 Open angle with borderline findings, low risk, bilateral: Secondary | ICD-10-CM | POA: Diagnosis not present

## 2022-07-01 DIAGNOSIS — L308 Other specified dermatitis: Secondary | ICD-10-CM | POA: Diagnosis not present

## 2022-07-01 DIAGNOSIS — L570 Actinic keratosis: Secondary | ICD-10-CM | POA: Diagnosis not present

## 2022-07-01 DIAGNOSIS — D0461 Carcinoma in situ of skin of right upper limb, including shoulder: Secondary | ICD-10-CM | POA: Diagnosis not present

## 2022-07-01 DIAGNOSIS — X32XXXA Exposure to sunlight, initial encounter: Secondary | ICD-10-CM | POA: Diagnosis not present

## 2022-07-13 ENCOUNTER — Ambulatory Visit: Payer: Medicare Other | Admitting: Orthopaedic Surgery

## 2022-07-13 ENCOUNTER — Encounter: Payer: Self-pay | Admitting: Family Medicine

## 2022-07-13 ENCOUNTER — Telehealth (INDEPENDENT_AMBULATORY_CARE_PROVIDER_SITE_OTHER): Payer: Medicare Other | Admitting: Family Medicine

## 2022-07-13 DIAGNOSIS — M1712 Unilateral primary osteoarthritis, left knee: Secondary | ICD-10-CM

## 2022-07-13 DIAGNOSIS — M79604 Pain in right leg: Secondary | ICD-10-CM | POA: Diagnosis not present

## 2022-07-13 DIAGNOSIS — M1711 Unilateral primary osteoarthritis, right knee: Secondary | ICD-10-CM

## 2022-07-13 MED ORDER — LIDOCAINE HCL 1 % IJ SOLN
2.0000 mL | INTRAMUSCULAR | Status: AC | PRN
  Administered 2022-07-13: 2 mL

## 2022-07-13 MED ORDER — BUPIVACAINE HCL 0.5 % IJ SOLN
2.0000 mL | INTRAMUSCULAR | Status: AC | PRN
  Administered 2022-07-13: 2 mL via INTRA_ARTICULAR

## 2022-07-13 MED ORDER — METHYLPREDNISOLONE ACETATE 40 MG/ML IJ SUSP
40.0000 mg | INTRAMUSCULAR | Status: AC | PRN
  Administered 2022-07-13: 40 mg via INTRA_ARTICULAR

## 2022-07-13 NOTE — Progress Notes (Signed)
   Subjective:    Patient ID: [name], male    DOB: March 03, 1932, 88 y.o.   MRN: [id number]  HPI Documentation for virtual audio and video telecommunications through [facility] encounter:  The patient was located at home. 2 patient identifiers used.  The provider was located in the office. The patient did consent to this visit and is aware of possible charges through their insurance for this visit.  The other persons participating in this telemedicine service were none. Time spent on call was 5 minutes and in review of previous records >15 minutes total for counseling and coordination of care.  This virtual service is not related to other E/M service within previous 7 days.  He states that he has a 4-day history of pain that is intermittent in nature and started in the right foot but then will be in varying parts of his leg including the hip.  It does not occur during the day.  There is no numbness or tingling with this.  Review of Systems     Objective:   Physical Exam Alert and in no distress otherwise not examined       Assessment & Plan:  Pain of right lower extremity I explained that it was difficult to say exactly what this was but taking 2 Tylenol 4 times per day is certainly reasonable approach but if continued difficulty he should come in here for further evaluation.

## 2022-07-13 NOTE — Progress Notes (Deleted)
Office Visit Note   Patient: [name]           Date of Birth: Jun 01, 1932           MRN: [id number] Visit Date: 07/13/2022              Requested by: Denita Lung, MD [place],  [place] [id number] PCP: Denita Lung, MD   Assessment & Plan: Visit Diagnoses: No diagnosis found.  Plan: ***  Follow-Up Instructions: No follow-ups on file.   Orders:  No orders of the defined types were placed in this encounter.  No orders of the defined types were placed in this encounter.     Procedures: No procedures performed   Clinical Data: No additional findings.   Subjective: No chief complaint on file.   HPI  Review of Systems   Objective: Vital Signs: There were no vitals taken for this visit.  Physical Exam  Ortho Exam  Specialty Comments:  EXAM: CT LUMBAR SPINE WITHOUT CONTRAST   TECHNIQUE: Multidetector CT imaging of the lumbar spine was performed without intravenous contrast administration. Multiplanar CT image reconstructions were also generated.   COMPARISON:  Lumbar spine radiographs 02/01/2018   FINDINGS: Segmentation: Normal   Alignment: Mild retrolisthesis L1-2, L2-3, L3-4. Mild anterolisthesis L4-5 approximately 4 mm. 5 mm retrolisthesis L5-S1. Mild lumbar scoliosis.   Vertebrae: Negative for fracture or mass   Paraspinal and other soft tissues: Heavily calcified aorta and iliac arteries without aneurysm. No retroperitoneal adenopathy or mass   Disc levels: T11-12: Bilateral facet degeneration. Negative for stenosis   L1-2: Advanced disc degeneration with disc space narrowing and mild spurring. Mild facet degeneration. Moderate right foraminal stenosis due to spurring   L1-2: Advanced disc degeneration with disc space narrowing. Diffuse disc bulging. Moderate right foraminal encroachment due to vertebral endplate spurring and facet hypertrophy.   L2-3: Advanced disc degeneration with fusion of the disc  space on the right. Mild foraminal stenosis bilaterally.   L3-4: Disc degeneration and spondylosis. Bilateral facet hypertrophy. Severe left foraminal encroachment and mild right foraminal encroachment due to spurring   L4-5: 4 mm anterolisthesis with advanced disc degeneration. Advanced facet hypertrophy bilaterally. Moderate central canal stenosis. Severe subarticular stenosis bilaterally with severe left foraminal encroachment.   L5-S1: 5 mm retrolisthesis with advanced disc degeneration. Diffuse endplate spurring and mild facet hypertrophy. Left-sided facet degeneration and spurring projecting into the spinal canal and likely affecting the left L5 nerve root. Moderate subarticular stenosis and foraminal stenosis bilaterally due to spurring   IMPRESSION: Advanced multilevel degenerative change throughout the lumbar spine with extensive disc space narrowing and spurring. Multilevel foraminal encroachment due to spurring as described above. Moderate central canal stenosis at L4-5.   Atherosclerotic aorta     Electronically Signed   By: Franchot Gallo M.D.   On: 02/19/2018 13:32  Imaging: No results found.   PMFS History: Patient Active Problem List   Diagnosis Date Noted   Aortic atherosclerosis ([facility]) 05/03/2022   Parkinsonism 02/07/2022   Retinopathy  11/05/2020   Senile purpura ([place] [place]) 04/30/2020   Stage 3a chronic kidney disease ([place] [facility]) 04/30/2020   Irritable bowel syndrome with constipation 04/30/2020   Primary osteoarthritis of left knee 10/03/2019   Primary osteoarthritis of right knee 10/03/2019   Gastroesophageal reflux disease 04/29/2019   Pacemaker 04/21/2017   Complete heart block ([facility]) 01/11/2017   History of prostate cancer 12/27/2010   Arthritis 12/27/2010   History of cataract extraction 12/27/2010   HH (hiatus  hernia)    History of colonic polyps    Hyperlipidemia LDL goal <130 04/21/2008   Allergic rhinitis 04/21/2008   Essential hypertension  04/18/2008   Past Medical History:  Diagnosis Date   1st degree AV block    Aortic atherosclerosis ([place] [facility]) 05/03/2022   Arthritis    "hands, back, neck, shoulders, knees" (01/11/2017)   Eczema    GERD (gastroesophageal reflux disease)    HH (hiatus hernia)    History of blood transfusion    "maybe; w/prostate OR; they would have used my own" (01/11/2017)   History of colonic polyps    "I don't think I've had any polyps" (01/11/2017)   Hypercholesterolemia    Hypertension    Presence of permanent cardiac pacemaker    Prostate cancer ([place]) 1997   Seasonal allergies    SVT (supraventricular tachycardia)     Family History  Problem Relation Age of Onset   Cancer Mother    Alcoholism Father     Past Surgical History:  Procedure Laterality Date   AV NODE ABLATION  2000s X 2   AVNODE REENTRANT ABLATION   CATARACT EXTRACTION W/ INTRAOCULAR LENS  IMPLANT, BILATERAL     COLONOSCOPY  2007   MAGOD   INSERT / REPLACE / REMOVE PACEMAKER  01/11/2017   PACEMAKER IMPLANT N/A 01/11/2017   Procedure: Pacemaker Implant;  Surgeon: Evans Lance, MD;  Location: [facility];  Service: Cardiovascular;  Laterality: N/A;   PROSTATECTOMY     SKIN BIOPSY Right 02/04/2019   verruca vulgaris, irritated   TESTICLE SURGERY Left 2010   "benign growth"; [place] History   Occupational History   Occupation: retired  Tobacco Use   Smoking status: Former    Packs/day: 1.50    Years: 13.00    Total pack years: 19.50    Types: Cigarettes    Quit date: 08/25/1962    Years since quitting: 59.9   Smokeless tobacco: Never  Vaping Use   Vaping Use: Never used  Substance and Sexual Activity   Alcohol use: No   Drug use: No   Sexual activity: Yes

## 2022-07-13 NOTE — Progress Notes (Signed)
Office Visit Note   Patient: [name]           Date of Birth: 1932-02-11           MRN: [id number] Visit Date: 07/13/2022              Requested by: Joseph Lung, MD [place],  [place] [id number] PCP: Joseph Lung, MD   Assessment & Plan: Visit Diagnoses:  1. Primary osteoarthritis of left knee   2. Primary osteoarthritis of right knee     Plan: Impression is bilateral knee osteoarthritis.  Both knees injected with steroid today.  He tolerated this well.  Will see him back as needed.  Follow-Up Instructions: No follow-ups on file.   Orders:  No orders of the defined types were placed in this encounter.  No orders of the defined types were placed in this encounter.     Procedures: Large Joint Inj: bilateral knee on 07/13/2022 4:26 PM Indications: pain Details: 22 G needle  Arthrogram: No  Medications (Right): 2 mL lidocaine 1 %; 2 mL bupivacaine 0.5 %; 40 mg methylPREDNISolone acetate 40 MG/ML Medications (Left): 2 mL lidocaine 1 %; 2 mL bupivacaine 0.5 %; 40 mg methylPREDNISolone acetate 40 MG/ML Outcome: tolerated well, no immediate complications Patient was prepped and draped in the usual sterile fashion.       Clinical Data: No additional findings.   Subjective: Chief Complaint  Patient presents with   Right Knee - Pain   Left Knee - Pain    HPI [name] returns today for bilateral knee osteoarthritis.  Requesting repeat cortisone injections.  His right knee was last injected in June of this year and his left knee was injected in March.  Review of Systems   Objective: Vital Signs: There were no vitals taken for this visit.  Physical Exam  Ortho Exam Stable knee exams. Specialty Comments:  EXAM: CT LUMBAR SPINE WITHOUT CONTRAST   TECHNIQUE: Multidetector CT imaging of the lumbar spine was performed without intravenous contrast administration. Multiplanar CT image reconstructions were also generated.    COMPARISON:  Lumbar spine radiographs 02/01/2018   FINDINGS: Segmentation: Normal   Alignment: Mild retrolisthesis L1-2, L2-3, L3-4. Mild anterolisthesis L4-5 approximately 4 mm. 5 mm retrolisthesis L5-S1. Mild lumbar scoliosis.   Vertebrae: Negative for fracture or mass   Paraspinal and other soft tissues: Heavily calcified aorta and iliac arteries without aneurysm. No retroperitoneal adenopathy or mass   Disc levels: T11-12: Bilateral facet degeneration. Negative for stenosis   L1-2: Advanced disc degeneration with disc space narrowing and mild spurring. Mild facet degeneration. Moderate right foraminal stenosis due to spurring   L1-2: Advanced disc degeneration with disc space narrowing. Diffuse disc bulging. Moderate right foraminal encroachment due to vertebral endplate spurring and facet hypertrophy.   L2-3: Advanced disc degeneration with fusion of the disc space on the right. Mild foraminal stenosis bilaterally.   L3-4: Disc degeneration and spondylosis. Bilateral facet hypertrophy. Severe left foraminal encroachment and mild right foraminal encroachment due to spurring   L4-5: 4 mm anterolisthesis with advanced disc degeneration. Advanced facet hypertrophy bilaterally. Moderate central canal stenosis. Severe subarticular stenosis bilaterally with severe left foraminal encroachment.   L5-S1: 5 mm retrolisthesis with advanced disc degeneration. Diffuse endplate spurring and mild facet hypertrophy. Left-sided facet degeneration and spurring projecting into the spinal canal and likely affecting the left L5 nerve root. Moderate subarticular stenosis and foraminal stenosis bilaterally due to spurring   IMPRESSION: Advanced multilevel degenerative change throughout  the lumbar spine with extensive disc space narrowing and spurring. Multilevel foraminal encroachment due to spurring as described above. Moderate central canal stenosis at L4-5.   Atherosclerotic  aorta     Electronically Signed   By: [name] M.D.   On: 02/19/2018 13:32  Imaging: No results found.   PMFS History: Patient Active Problem List   Diagnosis Date Noted   Aortic atherosclerosis ([facility]) 05/03/2022   Parkinsonism 02/07/2022   Retinopathy  11/05/2020   Senile purpura ([place]) 04/30/2020   Stage 3a chronic kidney disease ([facility]) 04/30/2020   Irritable bowel syndrome with constipation 04/30/2020   Primary osteoarthritis of left knee 10/03/2019   Primary osteoarthritis of right knee 10/03/2019   Gastroesophageal reflux disease 04/29/2019   Pacemaker 04/21/2017   Complete heart block ([facility] [place]) 01/11/2017   History of prostate cancer 12/27/2010   Arthritis 12/27/2010   History of cataract extraction 12/27/2010   HH (hiatus hernia)    History of colonic polyps    Hyperlipidemia LDL goal <130 04/21/2008   Allergic rhinitis 04/21/2008   Essential hypertension 04/18/2008   Past Medical History:  Diagnosis Date   1st degree AV block    Aortic atherosclerosis ([facility]) 05/03/2022   Arthritis    "hands, back, neck, shoulders, knees" (01/11/2017)   Eczema    GERD (gastroesophageal reflux disease)    HH (hiatus hernia)    History of blood transfusion    "maybe; w/prostate OR; they would have used my own" (01/11/2017)   History of colonic polyps    "I don't think I've had any polyps" (01/11/2017)   Hypercholesterolemia    Hypertension    Presence of permanent cardiac pacemaker    Prostate cancer ([place]) 1997   Seasonal allergies    SVT (supraventricular tachycardia)     Family History  Problem Relation Age of Onset   Cancer Mother    Alcoholism Father     Past Surgical History:  Procedure Laterality Date   AV NODE ABLATION  2000s X 2   AVNODE REENTRANT ABLATION   CATARACT EXTRACTION W/ INTRAOCULAR LENS  IMPLANT, BILATERAL     COLONOSCOPY  2007   MAGOD   INSERT / REPLACE / REMOVE PACEMAKER  01/11/2017   PACEMAKER IMPLANT N/A 01/11/2017   Procedure: Pacemaker  Implant;  Surgeon: Evans Lance, MD;  Location: [facility];  Service: Cardiovascular;  Laterality: N/A;   PROSTATECTOMY     SKIN BIOPSY Right 02/04/2019   verruca vulgaris, irritated   TESTICLE SURGERY Left 2010   "benign growth"; [place] History   Occupational History   Occupation: retired  Tobacco Use   Smoking status: Former    Packs/day: 1.50    Years: 13.00    Total pack years: 19.50    Types: Cigarettes    Quit date: 08/25/1962    Years since quitting: 59.9   Smokeless tobacco: Never  Vaping Use   Vaping Use: Never used  Substance and Sexual Activity   Alcohol use: No   Drug use: No   Sexual activity: Yes

## 2022-07-20 ENCOUNTER — Ambulatory Visit (INDEPENDENT_AMBULATORY_CARE_PROVIDER_SITE_OTHER): Payer: Medicare Other

## 2022-07-20 DIAGNOSIS — I442 Atrioventricular block, complete: Secondary | ICD-10-CM

## 2022-07-20 LAB — CUP PACEART REMOTE DEVICE CHECK
Battery Remaining Longevity: 48 mo
Battery Voltage: 2.95 V
Brady Statistic AP VP Percent: 62.5 %
Brady Statistic AP VS Percent: 0.04 %
Brady Statistic AS VP Percent: 36.94 %
Brady Statistic AS VS Percent: 0.52 %
Brady Statistic RA Percent Paced: 61.9 %
Brady Statistic RV Percent Paced: 99.44 %
Date Time Interrogation Session: 20231227010152
Implantable Lead Connection Status: 753985
Implantable Lead Connection Status: 753985
Implantable Lead Implant Date: 20180620
Implantable Lead Implant Date: 20180620
Implantable Lead Location: 753859
Implantable Lead Location: 753860
Implantable Lead Model: 5076
Implantable Lead Model: 5076
Implantable Pulse Generator Implant Date: 20180620
Lead Channel Impedance Value: 266 Ohm
Lead Channel Impedance Value: 342 Ohm
Lead Channel Impedance Value: 361 Ohm
Lead Channel Impedance Value: 399 Ohm
Lead Channel Pacing Threshold Amplitude: 0.5 V
Lead Channel Pacing Threshold Amplitude: 0.875 V
Lead Channel Pacing Threshold Pulse Width: 0.4 ms
Lead Channel Pacing Threshold Pulse Width: 0.4 ms
Lead Channel Sensing Intrinsic Amplitude: 3 mV
Lead Channel Sensing Intrinsic Amplitude: 3 mV
Lead Channel Sensing Intrinsic Amplitude: 4.625 mV
Lead Channel Sensing Intrinsic Amplitude: 4.625 mV
Lead Channel Setting Pacing Amplitude: 1.5 V
Lead Channel Setting Pacing Amplitude: 2.5 V
Lead Channel Setting Pacing Pulse Width: 0.4 ms
Lead Channel Setting Sensing Sensitivity: 2 mV
Zone Setting Status: 755011
Zone Setting Status: 755011

## 2022-07-26 ENCOUNTER — Other Ambulatory Visit: Payer: Self-pay | Admitting: Family Medicine

## 2022-07-26 DIAGNOSIS — I10 Essential (primary) hypertension: Secondary | ICD-10-CM

## 2022-08-02 ENCOUNTER — Telehealth: Payer: Self-pay | Admitting: Family Medicine

## 2022-08-02 NOTE — Telephone Encounter (Signed)
Pt called he is having issues with R hip, ?bursitis    He has appt with his ortho next week  He has been taking ibuprofen( '200mg'$ )  2 tablets every 4 hours, He wants to know if you had any recommendations until he can get in with ortho?

## 2022-08-03 NOTE — Telephone Encounter (Signed)
Patient advised and verbalized understanding 

## 2022-08-04 ENCOUNTER — Encounter (HOSPITAL_BASED_OUTPATIENT_CLINIC_OR_DEPARTMENT_OTHER): Payer: Self-pay | Admitting: Emergency Medicine

## 2022-08-04 ENCOUNTER — Emergency Department (HOSPITAL_BASED_OUTPATIENT_CLINIC_OR_DEPARTMENT_OTHER)
Admission: EM | Admit: 2022-08-04 | Discharge: 2022-08-04 | Disposition: A | Discharge status: Home / Self Care | Payer: Medicare Other | Attending: Emergency Medicine | Admitting: Emergency Medicine

## 2022-08-04 ENCOUNTER — Emergency Department (HOSPITAL_BASED_OUTPATIENT_CLINIC_OR_DEPARTMENT_OTHER): Payer: Medicare Other

## 2022-08-04 ENCOUNTER — Other Ambulatory Visit: Payer: Self-pay

## 2022-08-04 DIAGNOSIS — Z950 Presence of cardiac pacemaker: Secondary | ICD-10-CM | POA: Diagnosis not present

## 2022-08-04 DIAGNOSIS — M25551 Pain in right hip: Secondary | ICD-10-CM | POA: Diagnosis not present

## 2022-08-04 DIAGNOSIS — N281 Cyst of kidney, acquired: Secondary | ICD-10-CM | POA: Diagnosis not present

## 2022-08-04 DIAGNOSIS — M5187 Other intervertebral disc disorders, lumbosacral region: Secondary | ICD-10-CM | POA: Insufficient documentation

## 2022-08-04 DIAGNOSIS — M5137 Other intervertebral disc degeneration, lumbosacral region: Secondary | ICD-10-CM

## 2022-08-04 DIAGNOSIS — I10 Essential (primary) hypertension: Secondary | ICD-10-CM | POA: Insufficient documentation

## 2022-08-04 DIAGNOSIS — M1611 Unilateral primary osteoarthritis, right hip: Secondary | ICD-10-CM | POA: Diagnosis not present

## 2022-08-04 DIAGNOSIS — M160 Bilateral primary osteoarthritis of hip: Secondary | ICD-10-CM | POA: Insufficient documentation

## 2022-08-04 DIAGNOSIS — M5136 Other intervertebral disc degeneration, lumbar region: Secondary | ICD-10-CM | POA: Diagnosis not present

## 2022-08-04 DIAGNOSIS — I700 Atherosclerosis of aorta: Secondary | ICD-10-CM | POA: Diagnosis not present

## 2022-08-04 MED ORDER — LIDOCAINE 5 % EX PTCH: 1.0000 | MEDICATED_PATCH | CUTANEOUS | 0 refills | Status: DC

## 2022-08-04 MED ORDER — IBUPROFEN 600 MG PO TABS
600.0000 mg | ORAL_TABLET | Freq: Three times a day (TID) | ORAL | 0 refills | Status: AC | PRN
Start: 2022-08-04 — End: 2022-08-09

## 2022-08-04 NOTE — ED Provider Notes (Signed)
[facility] Provider Note   CSN: [id number] Arrival date & time: 08/04/22  0940     History  Chief Complaint  Patient presents with   Hip Pain    [name] is a 89 y.o. male with past medical history hypertension, hyperlipidemia, osteoarthritis, status post pacemaker placement who presents to the ED complaining of right hip pain for the last 3 to 4 weeks.  Daughter at bedside also provides some history.  Patient has not had any falls or direct trauma to the hip.  However, as his pain worsens with weightbearing and ambulation after he is active he sometimes does have issues sitting down to rest and will "pop" into a chair when sitting down.  He has not fallen or hit his head.  He has a pretty significant history of osteoarthritis in the bilateral knees and back and is followed closely by orthopedics from whom he receives cortisone injections into the joints.  He denies any overlying rash to the right hip.  He denies fever, chills, chest pain, shortness of breath, leg swelling, abdominal pain, flank pain, dysuria or hematuria, other urinary symptoms, back pain, or weakness, numbness, or tingling to the bilateral lower extremities.  He has not previously injured this hip.  He has been taking ibuprofen at home over the last few days as recommended by his primary care for his symptoms but has had no relief.  Patient states that symptoms completely resolved when sitting, lying down, or at other times of rest, but they immediately returned as soon as he stands to weight-bear and then gradually worsen the more that he walks or is active.  On my interview and exam today, patient has no active pain and declines pain medication at that time. He denies any other symptoms at this time.        Home Medications Prior to Admission medications   Medication Sig Start Date End Date Taking? Authorizing Provider  ibuprofen (ADVIL) 600 MG tablet Take 1 tablet (600 mg total) by  mouth every 8 (eight) hours as needed for up to 5 days for mild pain or moderate pain. 08/04/22 08/09/22 Yes Eisen Robenson L, PA-C  lidocaine (LIDODERM) 5 % Place 1 patch onto the skin daily. Remove & Discard patch within 12 hours or as directed by MD 08/04/22  Yes Kelbi Renstrom L, PA-C  acetaminophen (TYLENOL) 650 MG CR tablet Take 650 mg by mouth daily as needed for pain.    [provider]  alclomethasone (ACLOVATE) 0.05 % cream APPLY TWICE DAILY 06/13/22   Denita Lung, MD  amLODipine (NORVASC) 5 MG tablet TAKE 1 TABLET BY MOUTH DAILY 05/25/22   Denita Lung, MD  hydrochlorothiazide (HYDRODIURIL) 12.5 MG tablet TAKE 1 TABLET BY MOUTH DAILY 07/26/22   Denita Lung, MD  omeprazole (PRILOSEC) 20 MG capsule TAKE 1 CAPSULE BY MOUTH  TWICE DAILY BEFORE MEALS 01/24/22   Denita Lung, MD  rosuvastatin (CRESTOR) 20 MG tablet Take 1 tablet (20 mg total) by mouth daily. 05/20/22   Denita Lung, MD  triamcinolone cream (KENALOG) 0.1 % APPLY TOPICALLY TO AFFECTED  AREA(S) TWICE DAILY 05/25/22   Denita Lung, MD      Allergies    Codeine    Review of Systems   Review of Systems  Constitutional:  Negative for chills and fever.  HENT:  Negative for ear pain and sore throat.   Eyes:  Negative for pain and visual disturbance.  Respiratory:  Negative for cough, chest tightness and shortness of breath.   Cardiovascular:  Negative for chest pain, palpitations and leg swelling.  Gastrointestinal:  Negative for abdominal pain, diarrhea, nausea and vomiting.  Genitourinary:  Negative for difficulty urinating, dysuria, flank pain, frequency and hematuria.  Musculoskeletal:  Positive for gait problem. Negative for back pain, joint swelling, neck pain and neck stiffness.       Right hip pain  Skin:  Negative for color change, rash and wound.  Neurological:  Negative for dizziness, seizures, syncope, facial asymmetry, speech difficulty, weakness, light-headedness, numbness and headaches.   Psychiatric/Behavioral:  Negative for confusion.   All other systems reviewed and are negative.   Physical Exam Updated Vital Signs BP (!) 142/68   Pulse 68   Temp 97.9 F (36.6 C)   Resp 16   Wt 68.2 kg   SpO2 98%   BMI 24.64 kg/m  Physical Exam Vitals and nursing note reviewed.  Constitutional:      General: He is not in acute distress.    Appearance: Normal appearance.  HENT:     Head: Normocephalic and atraumatic.     Mouth/Throat:     Mouth: Mucous membranes are moist.  Eyes:     Conjunctiva/sclera: Conjunctivae normal.  Cardiovascular:     Rate and Rhythm: Normal rate and regular rhythm.     Heart sounds: No murmur heard. Pulmonary:     Effort: Pulmonary effort is normal. No respiratory distress.     Breath sounds: Normal breath sounds. No wheezing, rhonchi or rales.  Abdominal:     General: Abdomen is flat.     Palpations: Abdomen is soft.     Tenderness: There is no abdominal tenderness. There is no right CVA tenderness, left CVA tenderness, guarding or rebound.  Musculoskeletal:     Cervical back: Normal range of motion and neck supple.     Right lower leg: No edema.     Left lower leg: No edema.     Comments: No obvious deformity to either hip, no tenderness overlying right hip, flank, or back, no overlying rash or skin changes, normal sensation distally, normal pulses distally, when comparing lower extremities left lower extremity is shortened in comparison to right  Skin:    General: Skin is warm and dry.     Capillary Refill: Capillary refill takes less than 2 seconds.  Neurological:     Mental Status: He is alert. Mental status is at baseline.  Psychiatric:        Behavior: Behavior normal.     ED Results / Procedures / Treatments   Labs (all labs ordered are listed, but only abnormal results are displayed) Labs Reviewed - No data to display  EKG None  Radiology CT PELVIS WO CONTRAST  Result Date: 08/04/2022 CLINICAL DATA:  Right hip pain  for several weeks.  No known injury. EXAM: CT PELVIS WITHOUT CONTRAST TECHNIQUE: Multidetector CT imaging of the pelvis was performed following the standard protocol without intravenous contrast. RADIATION DOSE REDUCTION: This exam was performed according to the departmental dose-optimization program which includes automated exposure control, adjustment of the mA and/or kV according to patient size and/or use of iterative reconstruction technique. COMPARISON:  May 02, 2022. FINDINGS: Urinary Tract: Left renal cyst is again noted for which no further follow-up is required. Urinary bladder is unremarkable. Bowel:  Unremarkable visualized pelvic bowel loops. Vascular/Lymphatic: Aortic atherosclerosis. No significant adenopathy is noted. Reproductive:  Status post prostatectomy. Other:  No hernia or ascites is noted.  Musculoskeletal: No fracture or dislocation is noted. Mild degenerative changes seen involving both hip joints. Severe multilevel degenerative disc disease is noted in lower lumbar spine. IMPRESSION: Severe multilevel degenerative disc disease seen in lower lumbar spine. No fracture or dislocation is noted. Mild degenerative changes seen involving both hip joints. Status post prostatectomy. Aortic Atherosclerosis (ICD10-I70.0). Electronically Signed   By: Marijo Conception M.D.   On: 08/04/2022 11:15   DG Hip Unilat W or Wo Pelvis 2-3 Views Right  Result Date: 08/04/2022 CLINICAL DATA:  Right hip pain. EXAM: DG HIP (WITH OR WITHOUT PELVIS) 2-3V RIGHT COMPARISON:  CT abdomen and pelvis 05/02/2022 FINDINGS: Mildly decreased bone mineralization. Moderate pubic symphysis joint space narrowing. Mild-to-moderate bilateral sacroiliac joint space narrowing. Mild bilateral femoroacetabular joint space narrowing. Minimal bilateral femoral head-neck junction degenerative osteophytes. No acute fracture is seen. No dislocation. Surgical clips overlie the midline inferior pelvis status post prostatectomy.  Mild-to-moderate vascular calcifications. Severe L5-S1 and L4-5 disc space narrowing, endplate sclerosis, and peripheral osteophytosis. IMPRESSION: 1. Mild bilateral femoroacetabular osteoarthritis. 2. Severe L5-S1 greater than L4-5 degenerative disc disease. Electronically Signed   By: Yvonne Kendall M.D.   On: 08/04/2022 10:36    Procedures None  Medications Ordered in ED Medications - No data to display  ED Course/ Medical Decision Making/ A&P                           Medical Decision Making Amount and/or Complexity of Data Reviewed Radiology: ordered. Decision-making details documented in ED Course.  Risk Prescription drug management.   This is an 89 year old male presenting to the ED for right hip pain that began several weeks ago.  Patient has had no identifiable trauma to the hip, fall, or other injury.  He does report that since the onset of pain he has had episodes where it is more difficult to sit down after he is up and walking around does this makes his pain worse so when he goes to sit down he will sometimes "plop" into a sitting position instead of gently sitting down.  He has no point tenderness over the right hip on exam as well as no overlying skin changes and has no tenderness to the neck or back either.  He has no radiating pain, numbness, or tingling to suggest sciatica/disc herniation. He has no urinary symptoms that make me suspicious for acute urinary tract infection or ureterolithiasis. No associated abdominal pain, nausea, vomiting, fever, chills, chest pain, shortness of breath, or other associated symptoms that suggest systemic process such as sepsis or septic joint, intra-abdominal pathology, or ACS. He has had no direct trauma so very low suspicion for acute hip/pelvic fracture or dislocation, however, on exam right lower extremity does seem to be lengthened in comparison to left lower extremity. X-ray of right hip revealed osteoarthritis and degenerative disc disease  but no acute changes. Discussed case with attending MD who also evaluated pt and agrees with findings it is best to proceed with CT scan to rule out acute fracture or dislocation. Fortunately, this exam also returned unremarkable for acute changes. Pt has had long-standing issues with osteoarthritis in his knees and back and is regularly followed by orthopedics. He has outpatient appointment scheduled for next week. Discussed findings with patient and daughter along with recommendation that pt follow up with ortho as scheduled to discuss his hip pain. Aware they may recommend further testing, physical therapy, or medication changes or joint injections to better  manage his pain. In the meantime, I will recommend ibuprofen and Tylenol in combination to treat symptoms 3 times daily. Pt/daughter deny any history of kidney disease and pt tolerates ibuprofen. Also will trial lidocaine patches topically for pain management. Given hip exercises he may attempt to complete while awaiting follow-up. Pt ambulatory multiple times in the ED and though he is using a cane to assist with ambulation he tolerates it well and shows no signs of distress and has a steady gait when using his cane. Instructed to continue with use of cane pending follow-up. All pt and daughter questions answered and pt stable for discharge. Given strict return precautions for worsening of condition, new injury, or other concerns.          Final Clinical Impression(s) / ED Diagnoses Final diagnoses:  Right hip pain  Osteoarthritis of both hips, unspecified osteoarthritis type  Degenerative disc disease at L5-S1 level    Rx / DC Orders ED Discharge Orders          Ordered    lidocaine (LIDODERM) 5 %  Every 24 hours        08/04/22 1214    ibuprofen (ADVIL) 600 MG tablet  Every 8 hours PRN        08/04/22 1214              Turner Daniels 08/04/22 2012    Elnora Morrison, MD 08/09/22 [phone]

## 2022-08-04 NOTE — ED Triage Notes (Signed)
Pt arrives pov, slow gait with cane, c/o RT side Hip pain x 3 weeks, worsening pain last 4 days. Pt denies injury, endorses daily morning exercises, but now making pain worse. Recent cortisone injections in knees

## 2022-08-04 NOTE — Discharge Instructions (Addendum)
Thank you for letting us take care of you today.  As discussed with you and your daughter, there is no fracture or dislocation on x-ray or CT of your right hip/pelvis.  Your findings of osteoarthritis and degenerative disc disease that are likely contributing to your symptoms.  To better manage your pain, I recommend you take ibuprofen '600mg'$  and Tylenol '1000mg'$  every 8 hours for the next few days. After that, please take these medications as needed. You may also use lidocaine patches to help with your symptoms.   Please follow up with your orthopedic as scheduled next Wednesday. They may want to do joint injections, further testing, refer you to physical therapy, or put you on different medication to treat your pain. If you develop worsening or severe pain, fall or injury, fever, chest pain, shortness of breath, or other concerns, please return to emergency department for re-evaluation.

## 2022-08-04 NOTE — ED Notes (Signed)
Patient transported to CT 

## 2022-08-04 NOTE — ED Provider Notes (Incomplete)
I provided a substantive portion of the care of this patient.  I personally performed the entirety of the history and medical decision making for this encounter. {Remember to document shared critical care using "edcritical" dot phrase:1}

## 2022-08-09 NOTE — Progress Notes (Signed)
Remote pacemaker transmission.   

## 2022-08-10 ENCOUNTER — Ambulatory Visit: Payer: Medicare Other | Admitting: Orthopaedic Surgery

## 2022-08-10 DIAGNOSIS — M7061 Trochanteric bursitis, right hip: Secondary | ICD-10-CM

## 2022-08-10 NOTE — Progress Notes (Signed)
Office Visit Note   Patient: [name]           Date of Birth: [birth year]           MRN: [id number] Visit Date: 08/10/2022              Requested by: Denita Lung, MD [place],  [place] [id number] PCP: Denita Lung, MD   Assessment & Plan: Visit Diagnoses:  1. Trochanteric bursitis, right hip     Plan: Impression is right hip trochanteric bursitis.  Today, we discussed various treatment options to include trochanteric bursa cortisone injection followed by IT band exercise program.  We also discussed that there is a possibility that he could have abductor tendon pathology or referred pain from his lumbar spine given his degree of arthritis.  He has agreed to proceed with troch bursa injection and IT program, but would also like referral to outpatient physical therapy.  There is a place he has a 9 but cannot remember the name so he will call us back and at that point we will make the referral.  Follow-up as needed.  Follow-Up Instructions: Return if symptoms worsen or fail to improve.   Orders:  No orders of the defined types were placed in this encounter.  No orders of the defined types were placed in this encounter.     Procedures: No procedures performed   Clinical Data: No additional findings.   Subjective: Chief Complaint  Patient presents with   Right Hip - Pain    HPI patient is a pleasant 89 year old gentleman who comes in today with right lateral hip pain.  This began about 3 to 4 weeks ago and abruptly worsened late last week.  He denies any injury or change in activity.  The pain he has is solely to the lateral hip.  He denies any groin pain, anterior thigh pain buttock pain or any radiation down the leg.  Symptoms primarily occur when he is walking.  He has been taking a regimented dose of Tylenol and ibuprofen without significant relief.  He denies any paresthesias down the right leg.  He does have a history of lumbar pathology.   He was seen in the ED for this hip issue last week where CT scan of the right hip was ordered.  Negative for fracture.   Review of Systems as detailed in HPI.  All others reviewed and are negative.   Objective: Vital Signs: There were no vitals taken for this visit.  Physical Exam well-developed well-nourished gentleman in no acute distress.  Alert and oriented x 3.  Ortho Exam right hip exam shows moderate tenderness to the greater trochanter.  No pain with logroll or FADIR testing.  No pain with hip abduction abduction or hip flexion.  Negative straight leg raise.  He is neurovascular intact distally.  Specialty Comments:  EXAM: CT LUMBAR SPINE WITHOUT CONTRAST   TECHNIQUE: Multidetector CT imaging of the lumbar spine was performed without intravenous contrast administration. Multiplanar CT image reconstructions were also generated.   COMPARISON:  Lumbar spine radiographs 02/01/2018   FINDINGS: Segmentation: Normal   Alignment: Mild retrolisthesis L1-2, L2-3, L3-4. Mild anterolisthesis L4-5 approximately 4 mm. 5 mm retrolisthesis L5-S1. Mild lumbar scoliosis.   Vertebrae: Negative for fracture or mass   Paraspinal and other soft tissues: Heavily calcified aorta and iliac arteries without aneurysm. No retroperitoneal adenopathy or mass   Disc levels: T11-12: Bilateral facet degeneration. Negative for stenosis   L1-2: Advanced  disc degeneration with disc space narrowing and mild spurring. Mild facet degeneration. Moderate right foraminal stenosis due to spurring   L1-2: Advanced disc degeneration with disc space narrowing. Diffuse disc bulging. Moderate right foraminal encroachment due to vertebral endplate spurring and facet hypertrophy.   L2-3: Advanced disc degeneration with fusion of the disc space on the right. Mild foraminal stenosis bilaterally.   L3-4: Disc degeneration and spondylosis. Bilateral facet hypertrophy. Severe left foraminal encroachment and mild  right foraminal encroachment due to spurring   L4-5: 4 mm anterolisthesis with advanced disc degeneration. Advanced facet hypertrophy bilaterally. Moderate central canal stenosis. Severe subarticular stenosis bilaterally with severe left foraminal encroachment.   L5-S1: 5 mm retrolisthesis with advanced disc degeneration. Diffuse endplate spurring and mild facet hypertrophy. Left-sided facet degeneration and spurring projecting into the spinal canal and likely affecting the left L5 nerve root. Moderate subarticular stenosis and foraminal stenosis bilaterally due to spurring   IMPRESSION: Advanced multilevel degenerative change throughout the lumbar spine with extensive disc space narrowing and spurring. Multilevel foraminal encroachment due to spurring as described above. Moderate central canal stenosis at L4-5.   Atherosclerotic aorta     Electronically Signed   By: Franchot Gallo M.D.   On: 02/19/2018 13:32  Imaging: No results found.   PMFS History: Patient Active Problem List   Diagnosis Date Noted   Aortic atherosclerosis ([place]) 05/03/2022   Parkinsonism 02/07/2022   Retinopathy  11/05/2020   Senile purpura ([place]) 04/30/2020   Stage 3a chronic kidney disease ([place]) 04/30/2020   Irritable bowel syndrome with constipation 04/30/2020   Primary osteoarthritis of left knee 10/03/2019   Primary osteoarthritis of right knee 10/03/2019   Gastroesophageal reflux disease 04/29/2019   Pacemaker 04/21/2017   Complete heart block ([place]) 01/11/2017   History of prostate cancer 12/27/2010   Arthritis 12/27/2010   History of cataract extraction 12/27/2010   HH (hiatus hernia)    History of colonic polyps    Hyperlipidemia LDL goal <130 04/21/2008   Allergic rhinitis 04/21/2008   Essential hypertension 04/18/2008   Past Medical History:  Diagnosis Date   1st degree AV block    Aortic atherosclerosis ([place]) 05/03/2022   Arthritis    "hands, back, neck, shoulders, knees"  (01/11/2017)   Eczema    GERD (gastroesophageal reflux disease)    HH (hiatus hernia)    History of blood transfusion    "maybe; w/prostate OR; they would have used my own" (01/11/2017)   History of colonic polyps    "I don't think I've had any polyps" (01/11/2017)   Hypercholesterolemia    Hypertension    Presence of permanent cardiac pacemaker    Prostate cancer ([place]) 1997   Seasonal allergies    SVT (supraventricular tachycardia)     Family History  Problem Relation Age of Onset   Cancer Mother    Alcoholism Father     Past Surgical History:  Procedure Laterality Date   AV NODE ABLATION  2000s X 2   AVNODE REENTRANT ABLATION   CATARACT EXTRACTION W/ INTRAOCULAR LENS  IMPLANT, BILATERAL     COLONOSCOPY  2007   MAGOD   INSERT / REPLACE / REMOVE PACEMAKER  01/11/2017   PACEMAKER IMPLANT N/A 01/11/2017   Procedure: Pacemaker Implant;  Surgeon: Evans Lance, MD;  Location: [facility];  Service: Cardiovascular;  Laterality: N/A;   PROSTATECTOMY     SKIN BIOPSY Right 02/04/2019   verruca vulgaris, irritated   TESTICLE SURGERY Left 2010   "benign growth";  [facility]   TONSILLECTOMY     Social History   Occupational History   Occupation: retired  Tobacco Use   Smoking status: Former    Packs/day: 1.50    Years: 13.00    Total pack years: 19.50    Types: Cigarettes    Quit date: 08/25/1962    Years since quitting: 60.0   Smokeless tobacco: Never  Vaping Use   Vaping Use: Never used  Substance and Sexual Activity   Alcohol use: No   Drug use: No   Sexual activity: Yes

## 2022-08-12 ENCOUNTER — Telehealth: Payer: Self-pay | Admitting: Family Medicine

## 2022-08-12 ENCOUNTER — Telehealth: Payer: Self-pay | Admitting: Physician Assistant

## 2022-08-12 NOTE — Telephone Encounter (Signed)
Pt called & asks your recommendations for a multi vitamin for him to take?

## 2022-08-12 NOTE — Telephone Encounter (Signed)
Patient wants to have [name] to call him he has questions..please advise.

## 2022-08-15 ENCOUNTER — Encounter: Payer: Self-pay | Admitting: Orthopaedic Surgery

## 2022-08-15 NOTE — Telephone Encounter (Signed)
Can you let him know we are in surgery all day and ask him what his questions are?

## 2022-08-15 NOTE — Telephone Encounter (Signed)
Tried to call. No answer. LMOM for him to call us back so that I can get some more information.

## 2022-08-16 DIAGNOSIS — D0461 Carcinoma in situ of skin of right upper limb, including shoulder: Secondary | ICD-10-CM | POA: Diagnosis not present

## 2022-08-16 DIAGNOSIS — L308 Other specified dermatitis: Secondary | ICD-10-CM | POA: Diagnosis not present

## 2022-08-16 DIAGNOSIS — I872 Venous insufficiency (chronic) (peripheral): Secondary | ICD-10-CM | POA: Diagnosis not present

## 2022-08-30 DIAGNOSIS — M419 Scoliosis, unspecified: Secondary | ICD-10-CM | POA: Diagnosis not present

## 2022-08-30 DIAGNOSIS — M6281 Muscle weakness (generalized): Secondary | ICD-10-CM | POA: Diagnosis not present

## 2022-08-30 DIAGNOSIS — M4134 Thoracogenic scoliosis, thoracic region: Secondary | ICD-10-CM | POA: Diagnosis not present

## 2022-08-30 DIAGNOSIS — M4306 Spondylolysis, lumbar region: Secondary | ICD-10-CM | POA: Diagnosis not present

## 2022-10-18 DIAGNOSIS — L308 Other specified dermatitis: Secondary | ICD-10-CM | POA: Diagnosis not present

## 2022-10-18 DIAGNOSIS — Z85828 Personal history of other malignant neoplasm of skin: Secondary | ICD-10-CM | POA: Diagnosis not present

## 2022-10-18 DIAGNOSIS — Z08 Encounter for follow-up examination after completed treatment for malignant neoplasm: Secondary | ICD-10-CM | POA: Diagnosis not present

## 2022-10-19 ENCOUNTER — Ambulatory Visit (INDEPENDENT_AMBULATORY_CARE_PROVIDER_SITE_OTHER): Payer: Medicare Other

## 2022-10-19 DIAGNOSIS — I442 Atrioventricular block, complete: Secondary | ICD-10-CM

## 2022-10-19 LAB — CUP PACEART REMOTE DEVICE CHECK
Battery Remaining Longevity: 43 mo
Battery Voltage: 2.95 V
Brady Statistic AP VP Percent: 69.31 %
Brady Statistic AP VS Percent: 0.04 %
Brady Statistic AS VP Percent: 29.98 %
Brady Statistic AS VS Percent: 0.67 %
Brady Statistic RA Percent Paced: 68.86 %
Brady Statistic RV Percent Paced: 99.3 %
Date Time Interrogation Session: 20240327070144
Implantable Lead Connection Status: 753985
Implantable Lead Connection Status: 753985
Implantable Lead Implant Date: 20180620
Implantable Lead Implant Date: 20180620
Implantable Lead Location: 753859
Implantable Lead Location: 753860
Implantable Lead Model: 5076
Implantable Lead Model: 5076
Implantable Pulse Generator Implant Date: 20180620
Lead Channel Impedance Value: 266 Ohm
Lead Channel Impedance Value: 323 Ohm
Lead Channel Impedance Value: 361 Ohm
Lead Channel Impedance Value: 380 Ohm
Lead Channel Pacing Threshold Amplitude: 0.5 V
Lead Channel Pacing Threshold Amplitude: 0.875 V
Lead Channel Pacing Threshold Pulse Width: 0.4 ms
Lead Channel Pacing Threshold Pulse Width: 0.4 ms
Lead Channel Sensing Intrinsic Amplitude: 3.875 mV
Lead Channel Sensing Intrinsic Amplitude: 3.875 mV
Lead Channel Sensing Intrinsic Amplitude: 5 mV
Lead Channel Sensing Intrinsic Amplitude: 5 mV
Lead Channel Setting Pacing Amplitude: 1.5 V
Lead Channel Setting Pacing Amplitude: 2.5 V
Lead Channel Setting Pacing Pulse Width: 0.4 ms
Lead Channel Setting Sensing Sensitivity: 2 mV
Zone Setting Status: 755011
Zone Setting Status: 755011

## 2022-10-31 ENCOUNTER — Other Ambulatory Visit: Payer: Self-pay | Admitting: Family Medicine

## 2022-10-31 DIAGNOSIS — I10 Essential (primary) hypertension: Secondary | ICD-10-CM

## 2022-11-04 MED ORDER — METRONIDAZOLE 250 MG PO TABS: 250.0000 mg | ORAL_TABLET | Freq: Three times a day (TID) | ORAL | 0 refills | Status: AC

## 2022-11-11 ENCOUNTER — Encounter: Payer: Self-pay | Admitting: Medical

## 2022-11-11 ENCOUNTER — Ambulatory Visit (INDEPENDENT_AMBULATORY_CARE_PROVIDER_SITE_OTHER): Payer: Medicare Other | Admitting: Medical

## 2022-11-11 VITALS — BP 130/70 | Wt 150.0 lb

## 2022-11-11 DIAGNOSIS — H938X2 Other specified disorders of left ear: Secondary | ICD-10-CM | POA: Diagnosis not present

## 2022-11-11 DIAGNOSIS — H6122 Impacted cerumen, left ear: Secondary | ICD-10-CM

## 2022-11-11 DIAGNOSIS — H9192 Unspecified hearing loss, left ear: Secondary | ICD-10-CM

## 2022-11-11 NOTE — Progress Notes (Signed)
Subjective:   Here for complaint decreased hearing, possible earwax buildup in left ear.  Symptoms include decreased hearing, pressure.  +prior history of similar.  No other aggravating or relieving factors.  No other complaint.  Review of Systems Constitutional: denies fever, chills, sweats ENT: no runny nose, ear pain, sore throat, hoarseness, sinus pain, teeth pain, tinnitus, hearing loss Gastroenterology: denies nausea, vomiting     Objective:   Physical Exam  General appearance: alert, no distress, WD/WN Ears: left ear canal with impacted cerumen, right ear canal normal.  HENT: conjunctiva/corneas normal, sclerae anicteric, nares patent, no discharge or erythema, pharynx normal Oral cavity: MMM, tongue normal, teeth normal Neurological: Hearing normal bilaterally to whisper    Assessment & Plan:    Encounter Diagnoses  Name Primary?   Decreased hearing of left ear Yes   Impacted cerumen of left ear    Pressure sensation in left ear      Discussed findings.  Discussed risk/benefits of procedure and patient agrees to procedure. Successfully used warm water lavage to remove impacted cerumen from left  ear canal. Patient tolerated procedure well. Advised they avoid using any cotton swabs or other devices to clean the ear canals.  Use basic hygiene as discussed.  Follow up prn.

## 2022-11-28 NOTE — Progress Notes (Signed)
Remote pacemaker transmission.   

## 2022-12-07 ENCOUNTER — Other Ambulatory Visit: Payer: Self-pay | Admitting: Family Medicine

## 2022-12-07 DIAGNOSIS — I10 Essential (primary) hypertension: Secondary | ICD-10-CM

## 2022-12-13 DIAGNOSIS — L308 Other specified dermatitis: Secondary | ICD-10-CM | POA: Diagnosis not present

## 2022-12-13 DIAGNOSIS — L440 Pityriasis rubra pilaris: Secondary | ICD-10-CM | POA: Diagnosis not present

## 2022-12-26 ENCOUNTER — Telehealth: Payer: Self-pay | Admitting: Family Medicine

## 2022-12-26 NOTE — Telephone Encounter (Signed)
Called patient and let him know since 2010 we do not have a vitamin D level in his chart.

## 2022-12-26 NOTE — Telephone Encounter (Signed)
Pt called and would like a call with the results of the last time he has been checked for vitman D.

## 2022-12-29 ENCOUNTER — Telehealth: Payer: Self-pay | Admitting: Family Medicine

## 2022-12-29 NOTE — Telephone Encounter (Signed)
Pt called and wants to know if he can come in get a Vit d Level checked states he has not had one checked in a long time he just wants the blood level checked

## 2022-12-30 NOTE — Telephone Encounter (Signed)
Pt is coming in next Tuesday

## 2023-01-02 DIAGNOSIS — K581 Irritable bowel syndrome with constipation: Secondary | ICD-10-CM

## 2023-01-02 DIAGNOSIS — D3131 Benign neoplasm of right choroid: Secondary | ICD-10-CM | POA: Diagnosis not present

## 2023-01-02 DIAGNOSIS — H35363 Drusen (degenerative) of macula, bilateral: Secondary | ICD-10-CM | POA: Diagnosis not present

## 2023-01-02 DIAGNOSIS — H35371 Puckering of macula, right eye: Secondary | ICD-10-CM | POA: Diagnosis not present

## 2023-01-02 DIAGNOSIS — H40013 Open angle with borderline findings, low risk, bilateral: Secondary | ICD-10-CM | POA: Diagnosis not present

## 2023-01-02 DIAGNOSIS — R140 Abdominal distension (gaseous): Secondary | ICD-10-CM

## 2023-01-04 ENCOUNTER — Ambulatory Visit (INDEPENDENT_AMBULATORY_CARE_PROVIDER_SITE_OTHER)
Admission: RE | Admit: 2023-01-04 | Discharge: 2023-01-04 | Disposition: A | Discharge status: Home / Self Care | Payer: Medicare Other | Source: Ambulatory Visit | Attending: Physician Assistant | Admitting: Physician Assistant

## 2023-01-04 ENCOUNTER — Other Ambulatory Visit: Payer: Medicare Other

## 2023-01-04 DIAGNOSIS — R140 Abdominal distension (gaseous): Secondary | ICD-10-CM | POA: Diagnosis not present

## 2023-01-04 DIAGNOSIS — K581 Irritable bowel syndrome with constipation: Secondary | ICD-10-CM | POA: Diagnosis not present

## 2023-01-05 ENCOUNTER — Ambulatory Visit: Payer: Medicare Other | Admitting: Family Medicine

## 2023-01-05 ENCOUNTER — Encounter: Payer: Self-pay | Admitting: Physician Assistant

## 2023-01-05 ENCOUNTER — Ambulatory Visit: Payer: Medicare Other | Admitting: Physician Assistant

## 2023-01-05 VITALS — BP 128/62 | HR 66 | Ht 65.0 in | Wt 145.0 lb

## 2023-01-05 DIAGNOSIS — K581 Irritable bowel syndrome with constipation: Secondary | ICD-10-CM | POA: Diagnosis not present

## 2023-01-05 DIAGNOSIS — R140 Abdominal distension (gaseous): Secondary | ICD-10-CM

## 2023-01-05 DIAGNOSIS — R143 Flatulence: Secondary | ICD-10-CM

## 2023-01-05 MED ORDER — RIFAXIMIN 550 MG PO TABS: 550.0000 mg | ORAL_TABLET | Freq: Three times a day (TID) | ORAL | 0 refills | Status: DC

## 2023-01-05 NOTE — Patient Instructions (Addendum)
We have sent the following medications to your pharmacy for you to pick up at your convenience: Xifaxan 550 mg three times daily for 14 days.   _______________________________________________________  If your blood pressure at your visit was 140/90 or greater, please contact your primary care physician to follow up on this.  _______________________________________________________  If you are age 87 or older, your body mass index should be between 23-30. Your Body mass index is 24.13 kg/m. If this is out of the aforementioned range listed, please consider follow up with your Primary Care Provider.  If you are age 87 or younger, your body mass index should be between 19-25. Your Body mass index is 24.13 kg/m. If this is out of the aformentioned range listed, please consider follow up with your Primary Care Provider.   ________________________________________________________  The [facility] providers would like to encourage you to use [facility] to communicate with providers for non-urgent requests or questions.  Due to long hold times on the telephone, sending your provider a message by [facility] may be a faster and more efficient way to get a response.  Please allow 48 business hours for a response.  Please remember that this is for non-urgent requests.  _______________________________________________________

## 2023-01-05 NOTE — Progress Notes (Signed)
Chief Complaint: Bloating and Gas  HPI:    Mr. Niemeier is an 89 year old male with a past medical history as listed below including reflux and IBS, known to Dr. Marina Goodell, who presents to clinic today for continued bloating and gas.    05/11/2018 patient had positive SIBO testing.  He went on Xifaxan for 2 weeks and had felt improvement on most days while taking the medicine.  Then about a week after his symptoms returned of bloating and excessive gas.    06/15/2018 patient followed with Dr. Marina Goodell and at that time was tried on Restora probiotic.  Also given a lot of anti-gas education.    07/14/2020 patient seen in clinic by me and described problems with constipation for at least 21 years and taking Metamucil daily which worked well for him.  Does describe some gas and bloating occasionally.  He has tried all the probiotics and nothing seems to help.  Also saw dietitian which did not seem to help.  At that time we discussed a trial of Gas-X prior to eating lunch and trying to move his MiraLAX dose to before lunch instead of after.    We have spoken on the phone multiple times since patient was seen in 2021.  The last 10/25/2021 when the patient asked about starting VSL #3.  I did not advise this as I thought it would add to his symptoms of gas and bloating.  Recommend that he stick to [employer] or [employer].    09/02/2021 CBC normal.  CMP normal.    11/12/2021 seen in clinic and continues to describe gas and bloating.  At that time discussed his previous testing for SIBO which was positive and benefit from Xifaxan.  Ordered a two-view abdominal x-ray and discussed possible IBgard.    Since being seen last year patient is called in multiple times, please see his chart for all of those phone messages.  We have tried a lot of things including IBgard, Gas-X and even a dose of Metronidazole back in April for SIBO, none of which are helping.    01/04/2023 abdominal x-ray is pending.    Today, patient  presents to clinic accompanied by his daughter.  He again describes his history, long-term IBS-C typically managed with a dose of MiraLAX around 3 PM in the day as he has found this works best for him.  Describes his typical day of waking up and eating rice chex with skim milk and feels okay after this and typically has a bowel movement shortly thereafter.  He is okay until lunch but then it does not seem to matter what he eats though 90% of the time he is eating a sandwich of some sort he will develop gas as soon as he eats lunch and it is uncomfortable until dinner.  Then at dinner he has recurrence of gas afterwards but not quite as much.  Occasionally will have a bowel movement in the evening as well.  Most concerning to him is that over the past week he has had severe gas and explosions of gas when he sits down on the toilet for at least the past 6 to 7 days.  We have provided him a copy of the low FODMAP diet and low gas producing diet and he has been trying to follow some of this, but is leery to change much of his primary diet.  Does tell me he was eating [employer] every day and read this could be bad for his  symptoms so he stopped this.  Recall we treated him with Flagyl for SIBO which he tells me was unhelpful, previously treated with Xifaxan which she does not necessarily recall.    Denies fever, chills, weight loss or blood in his stool.   Past Medical History:  Diagnosis Date   1st degree AV block    Aortic atherosclerosis (HCC) 05/03/2022   Arthritis    "hands, back, neck, shoulders, knees" (01/11/2017)   Eczema    GERD (gastroesophageal reflux disease)    HH (hiatus hernia)    History of blood transfusion    "maybe; w/prostate OR; they would have used my own" (01/11/2017)   History of colonic polyps    "I don't think I've had any polyps" (01/11/2017)   Hypercholesterolemia    Hypertension    Presence of permanent cardiac pacemaker    Prostate cancer (HCC) 1997   Seasonal allergies     SVT (supraventricular tachycardia)     Past Surgical History:  Procedure Laterality Date   AV NODE ABLATION  2000s X 2   AVNODE REENTRANT ABLATION   CATARACT EXTRACTION W/ INTRAOCULAR LENS  IMPLANT, BILATERAL     COLONOSCOPY  2007   MAGOD   INSERT / REPLACE / REMOVE PACEMAKER  01/11/2017   PACEMAKER IMPLANT N/A 01/11/2017   Procedure: Pacemaker Implant;  Surgeon: Marinus Maw, MD;  Location: [facility] IN[facility];  Service: Cardiovascular;  Laterality: N/A;   PROSTATECTOMY     SKIN BIOPSY Right 02/04/2019   verruca vulgaris, irritated   TESTICLE SURGERY Left 2010   "benign growth"; [facility]   TONSILLECTOMY      Current Outpatient Medications  Medication Sig Dispense Refill   acetaminophen (TYLENOL) 650 MG CR tablet Take 650 mg by mouth daily as needed for pain.     alclomethasone (ACLOVATE) 0.05 % cream APPLY TWICE DAILY 30 g 3   amLODipine (NORVASC) 5 MG tablet TAKE 1 TABLET BY MOUTH DAILY 90 tablet 3   hydrochlorothiazide (HYDRODIURIL) 12.5 MG tablet TAKE 1 TABLET BY MOUTH DAILY 90 tablet 1   lidocaine (LIDODERM) 5 % Place 1 patch onto the skin daily. Remove & Discard patch within 12 hours or as directed by MD 30 patch 0   omeprazole (PRILOSEC) 20 MG capsule TAKE 1 CAPSULE BY MOUTH  TWICE DAILY BEFORE MEALS 180 capsule 3   rosuvastatin (CRESTOR) 20 MG tablet Take 1 tablet (20 mg total) by mouth daily. 90 tablet 3   triamcinolone cream (KENALOG) 0.1 % APPLY TOPICALLY TO AFFECTED  AREA(S) TWICE DAILY 90 g 1   No current facility-administered medications for this visit.    Allergies as of 01/05/2023 - Review Complete 01/05/2023  Allergen Reaction Noted   Codeine Nausea And Vomiting     Family History  Problem Relation Age of Onset   Cancer Mother    Alcoholism Father     Social History   Socioeconomic History   Marital status: Married    Spouse name: Not on file   Number of children: Not on file   Years of education: Not on file   Highest education level: Not on  file  Occupational History   Occupation: retired  Tobacco Use   Smoking status: Former    Packs/day: 1.50    Years: 13.00    Additional pack years: 0.00    Total pack years: 19.50    Types: Cigarettes    Quit date: 08/25/1962    Years since quitting: 60.4   Smokeless tobacco: Never  Vaping Use   Vaping Use: Never used  Substance and Sexual Activity   Alcohol use: No   Drug use: No   Sexual activity: Yes  Other Topics Concern   Not on file  Social History Narrative   Originally from [place]   Social Determinants of Health   Financial Resource Strain: Not on file  Food Insecurity: Not on file  Transportation Needs: Not on file  Physical Activity: Not on file  Stress: Not on file  Social Connections: Not on file  Intimate Partner Violence: Not on file    Review of Systems:    Constitutional: No weight loss, fever or chills Cardiovascular: No chest pain Respiratory: No SOB Gastrointestinal: See HPI and otherwise negative   Physical Exam:  Vital signs: BP 128/62   Pulse 66   Ht 5\' 5"  (1.651 m)   Wt 145 lb (65.8 kg)   BMI 24.13 kg/m   Constitutional:   Pleasant Elderly Caucasian male appears to be in NAD, Well developed, Well nourished, alert and cooperative Respiratory: Respirations even and unlabored. Lungs clear to auscultation bilaterally.   No wheezes, crackles, or rhonchi.  Cardiovascular: Normal S1, S2. No MRG. Regular rate and rhythm. No peripheral edema, cyanosis or pallor.  Gastrointestinal:  Soft, nondistended, nontender. No rebound or guarding. Normal bowel sounds. No appreciable masses or hepatomegaly. Rectal:  Not performed.  Psychiatric:Demonstrates good judgement and reason without abnormal affect or behaviors.  RELEVANT LABS AND IMAGING: CBC    Component Value Date/Time   WBC 6.9 09/02/2021 0951   WBC 7.1 04/21/2017 0949   RBC 5.17 09/02/2021 0951   RBC 5.62 04/21/2017 0949   HGB 15.9 09/02/2021 0951   HCT 47.4 09/02/2021 0951   PLT 192  09/02/2021 0951   MCV 92 09/02/2021 0951   MCH 30.8 09/02/2021 0951   MCH 28.6 04/21/2017 0949   MCHC 33.5 09/02/2021 0951   MCHC 32.9 04/21/2017 0949   RDW 12.2 09/02/2021 0951   LYMPHSABS 0.7 09/02/2021 0951   MONOABS 665 11/17/2015 0001   EOSABS 0.2 09/02/2021 0951   BASOSABS 0.0 09/02/2021 0951    CMP     Component Value Date/Time   NA 144 09/02/2021 0951   K 3.7 09/02/2021 0951   CL 101 09/02/2021 0951   CO2 25 09/02/2021 0951   GLUCOSE 77 09/02/2021 0951   GLUCOSE 100 (H) 04/21/2017 0949   BUN 15 09/02/2021 0951   CREATININE 1.01 09/02/2021 0951   CREATININE 0.98 04/21/2017 0949   CALCIUM 10.1 09/02/2021 0951   PROT 7.0 09/02/2021 0951   ALBUMIN 4.7 (H) 09/02/2021 0951   AST 21 09/02/2021 0951   ALT 17 09/02/2021 0951   ALKPHOS 110 09/02/2021 0951   BILITOT 0.4 09/02/2021 0951   GFRNONAA 62 04/30/2020 1032   GFRAA 71 04/30/2020 1032    Assessment: 1.  Gas and bloating: Chronic for the patient, previous SIBO testing was positive initially treated with Xifaxan which did help, recently treated with Flagyl as his insurance was not approving Xifaxan and it did not help, continues with a lot of bloating and gas, extreme over the past week with no real changes in diet or medications; consider SIBO 2.  IBS-C: Mostly controlled on MiraLAX at 3 PM every day  Plan: 1.  We discussed all the measures that we have taken to try and help him with gas and bloating.  Explained that I do not think we will ever get to a point where he is not experiencing gas and  bloating in some form during his day.  Most concerning to him is that all of this has grown extreme over the past 6 to 7 days.  Suspected could be his SIBO not fully treated with the Flagyl.  Will try to get Xifaxan 550 3 times daily x 2 weeks approved through his insurance.  Discussed this with his daughter.  If we cannot get it approved or it is too expensive could trial some samples if we can get a hold of them. 2.  Discussed  the low FODMAP diet in detail.  Explained this is really meant to be a strict diet for 6 weeks and then adding in food to see if it bothers him.  Patient is very leery to change his sandwiches at lunch.  He seems to be stuck in a routine even though eating sandwiches 90% of the time seems to be worsening his gas in the afternoon. 3.  Continue MiraLAX 4.  Will review x-ray when it is available. 5.  I have set patient up for follow-up with Dr. Marina Goodell as he has not seen him in some time and I am not sure what else that I can offer him.  He will keep in touch in the meantime.  If treated with Xifaxan if symptoms continue could consider a CT or other to see if anything is changing.  Hyacinth Meeker, PA-C [facility] 01/05/2023, 9:29 AM  Cc: Ronnald Nian, MD

## 2023-01-06 NOTE — Progress Notes (Signed)
Noted  

## 2023-01-09 NOTE — Telephone Encounter (Signed)
X-ray report still not available. Called x-ray support to see if imaging study could be read.  Will attempt to get samples of Xifaxan.

## 2023-01-10 ENCOUNTER — Ambulatory Visit (INDEPENDENT_AMBULATORY_CARE_PROVIDER_SITE_OTHER): Payer: Medicare Other | Admitting: Family Medicine

## 2023-01-10 ENCOUNTER — Encounter: Payer: Self-pay | Admitting: Family Medicine

## 2023-01-10 VITALS — BP 132/62 | HR 59 | Temp 97.6°F | Resp 16 | Wt 145.8 lb

## 2023-01-10 DIAGNOSIS — Z79899 Other long term (current) drug therapy: Secondary | ICD-10-CM | POA: Diagnosis not present

## 2023-01-10 DIAGNOSIS — R21 Rash and other nonspecific skin eruption: Secondary | ICD-10-CM

## 2023-01-10 NOTE — Progress Notes (Signed)
   Subjective:    Patient ID: AVRUMY CAMILLI, male    DOB: 27-Nov-1931, 89 y.o.   MRN: [id number]  HPI He is here for consult concerning possible vitamin D deficiency.  He has a rash and was seen by dermatology.  The recommendation was to spend more time in the sun and he is therefore concerned about the possibility of being vitamin D deficient.   Review of Systems     Objective:   Physical Exam Exam of both forearms does show slightly erythematous lesions approximately a centimeter in size on his arms.       Assessment & Plan:  Rash - Plan: VITAMIN D 25 Hydroxy (Vit-D Deficiency, Fractures) I did recommend use of a multivitamin to help with this.

## 2023-01-11 LAB — VITAMIN D 25 HYDROXY (VIT D DEFICIENCY, FRACTURES): Vit D, 25-Hydroxy: 23.6 ng/mL — ABNORMAL LOW (ref 30.0–100.0)

## 2023-01-12 ENCOUNTER — Telehealth: Payer: Self-pay | Admitting: Family Medicine

## 2023-01-12 NOTE — Telephone Encounter (Signed)
Pt called you back, I advised him what I seen under lab comments and tried to schedule for the blood draw but he says he will schedule it later. If you do call him again and he doesn't answer he says it is ok to leave a detailed message on his vm.

## 2023-01-18 ENCOUNTER — Ambulatory Visit (INDEPENDENT_AMBULATORY_CARE_PROVIDER_SITE_OTHER): Payer: Medicare Other

## 2023-01-18 ENCOUNTER — Other Ambulatory Visit: Payer: Medicare Other

## 2023-01-18 DIAGNOSIS — I442 Atrioventricular block, complete: Secondary | ICD-10-CM | POA: Diagnosis not present

## 2023-01-18 DIAGNOSIS — R143 Flatulence: Secondary | ICD-10-CM | POA: Diagnosis not present

## 2023-01-18 DIAGNOSIS — R140 Abdominal distension (gaseous): Secondary | ICD-10-CM | POA: Diagnosis not present

## 2023-01-18 LAB — CUP PACEART REMOTE DEVICE CHECK
Battery Remaining Longevity: 41 mo
Battery Voltage: 2.94 V
Brady Statistic AP VP Percent: 70.79 %
Brady Statistic AP VS Percent: 0.1 %
Brady Statistic AS VP Percent: 28.29 %
Brady Statistic AS VS Percent: 0.81 %
Brady Statistic RA Percent Paced: 70.37 %
Brady Statistic RV Percent Paced: 99.08 %
Date Time Interrogation Session: 20240626052325
Implantable Lead Connection Status: 753985
Implantable Lead Connection Status: 753985
Implantable Lead Implant Date: 20180620
Implantable Lead Implant Date: 20180620
Implantable Lead Location: 753859
Implantable Lead Location: 753860
Implantable Lead Model: 5076
Implantable Lead Model: 5076
Implantable Pulse Generator Implant Date: 20180620
Lead Channel Impedance Value: 304 Ohm
Lead Channel Impedance Value: 342 Ohm
Lead Channel Impedance Value: 380 Ohm
Lead Channel Impedance Value: 399 Ohm
Lead Channel Pacing Threshold Amplitude: 0.5 V
Lead Channel Pacing Threshold Amplitude: 0.75 V
Lead Channel Pacing Threshold Pulse Width: 0.4 ms
Lead Channel Pacing Threshold Pulse Width: 0.4 ms
Lead Channel Sensing Intrinsic Amplitude: 3.625 mV
Lead Channel Sensing Intrinsic Amplitude: 3.625 mV
Lead Channel Sensing Intrinsic Amplitude: 4.25 mV
Lead Channel Sensing Intrinsic Amplitude: 4.25 mV
Lead Channel Setting Pacing Amplitude: 1.5 V
Lead Channel Setting Pacing Amplitude: 2.5 V
Lead Channel Setting Pacing Pulse Width: 0.4 ms
Lead Channel Setting Sensing Sensitivity: 2 mV
Zone Setting Status: 755011
Zone Setting Status: 755011

## 2023-01-19 LAB — TISSUE TRANSGLUTAMINASE ABS,IGG,IGA
(tTG) Ab, IgA: <1 U/mL
(tTG) Ab, IgG: <1 U/mL

## 2023-01-19 LAB — IGA: Immunoglobulin A: 252 mg/dL (ref 70–320)

## 2023-01-23 ENCOUNTER — Telehealth: Payer: Self-pay

## 2023-01-23 NOTE — Telephone Encounter (Signed)
*  Gastro  PA request received for Xifaxan 550MG  tablets  PA submitted to [employer] Medicare Part D via CMM and is pending additional questions/determination  *previous improvement of Xifaxan *no benefit to Flagyl  Key: [id number]

## 2023-01-24 NOTE — Telephone Encounter (Signed)
Approved through 07/25/2023  *XIFAXAN TAB 550MG , use as directed, is approved through 07/25/2023 under your Medicare Part D benefit. Additionally, we received a request for an exception in the cost-sharing tier. This drug is in the Specialty Tier. Drugs in our Specialty Tier are not eligible for this type of exception. We do not lower the cost sharing amount for drugs in this tier. Therefore, your drug is excluded from an exception to the cost-sharing tier

## 2023-01-30 ENCOUNTER — Other Ambulatory Visit: Payer: Self-pay | Admitting: Family Medicine

## 2023-01-30 DIAGNOSIS — I700 Atherosclerosis of aorta: Secondary | ICD-10-CM

## 2023-02-07 NOTE — Progress Notes (Signed)
 Remote pacemaker transmission.   

## 2023-02-15 ENCOUNTER — Ambulatory Visit (INDEPENDENT_AMBULATORY_CARE_PROVIDER_SITE_OTHER): Payer: Medicare Other | Admitting: Family Medicine

## 2023-02-15 ENCOUNTER — Encounter: Payer: Self-pay | Admitting: Family Medicine

## 2023-02-15 VITALS — BP 132/70 | HR 59 | Temp 97.4°F | Resp 16 | Wt 143.0 lb

## 2023-02-15 DIAGNOSIS — K4090 Unilateral inguinal hernia, without obstruction or gangrene, not specified as recurrent: Secondary | ICD-10-CM | POA: Diagnosis not present

## 2023-02-15 NOTE — Progress Notes (Signed)
   Subjective:    Patient ID: [name], male    DOB: 04/22/1932, 89 y.o.   MRN: [id number]  HPI He is here for evaluation of a lesion present in the left inguinal area.  He states that it is slowly getting larger.  It does go away when he lies down.  It is causing some discomfort but no nausea vomiting or abdominal pain.   Review of Systems     Objective:    Physical Exam Exam of the left inguinal area does show a hernia in the upper inguinal area.  It is not into the scrotal area.       Assessment & Plan:   Inguinal hernia of left side without obstruction or gangrene - Plan: Ambulatory referral to General Surgery I discussed the hernia with him in regard to the fact that it is elective but does need to be taken care of since he is having more difficulty with that.

## 2023-02-27 ENCOUNTER — Encounter (INDEPENDENT_AMBULATORY_CARE_PROVIDER_SITE_OTHER): Payer: Medicare Other | Admitting: Ophthalmology

## 2023-03-02 ENCOUNTER — Ambulatory Visit: Payer: Self-pay | Admitting: Surgery

## 2023-03-03 ENCOUNTER — Telehealth: Payer: Self-pay | Admitting: *Deleted

## 2023-03-03 ENCOUNTER — Ambulatory Visit: Payer: Self-pay | Admitting: Surgery

## 2023-03-03 DIAGNOSIS — K429 Umbilical hernia without obstruction or gangrene: Secondary | ICD-10-CM | POA: Diagnosis not present

## 2023-03-03 DIAGNOSIS — K4090 Unilateral inguinal hernia, without obstruction or gangrene, not specified as recurrent: Secondary | ICD-10-CM | POA: Diagnosis not present

## 2023-03-03 NOTE — Telephone Encounter (Signed)
I will send a message to our scheduling team to see if they can help find a new pt appt. Pt have been followed by EP.

## 2023-03-03 NOTE — Telephone Encounter (Signed)
In reviewing the chart looks like the pt is going to need a new pt appt. Pt last seen by Dr. Ladona Ridgel 11/2018.

## 2023-03-03 NOTE — H&P (Signed)
Subjective    Chief Complaint: New Consultation (HERNIA)       History of Present Illness: [name] is a 89 y.o. male who is seen today as an office consultation at the request of Dr. Susann Givens for evaluation of New Consultation (HERNIA) .     This is an 89 year old male referred by PCP who presents with an enlarging mass in his left groin.  This resolves when he is supine.  He denies any obstructive symptoms.  He does report some discomfort associated with this.  He is referred to [place] for further evaluation.   The patient had a CT of the pelvis without contrast in January of this year that did not show any sign of hernia.  He had a CT of the abdomen and pelvis with contrast in October 2023 that also did not show any sign of inguinal hernia.   The patient has multiple comorbidities including permanent pacemaker, history of SVT, first-degree AV block, aortic atherosclerosis.  He is not on any blood thinners.  He lives independently with his wife.  He still drives.  He states that he is fairly active.  He has significant issues with constipation that seems to be worse with the enlarging hernia.  He reports minimal discomfort at his umbilicus.   The patient is accompanied by his daughter today.   Review of Systems: A complete review of systems was obtained from the patient.  I have reviewed this information and discussed as appropriate with the patient.  See HPI as well for other ROS.   Review of Systems  Constitutional: Negative.   HENT:  Positive for hearing loss.   Eyes: Negative.   Respiratory: Negative.    Cardiovascular: Negative.   Gastrointestinal:  Positive for constipation.  Genitourinary: Negative.   Musculoskeletal: Negative.   Skin:  Positive for itching.  Neurological: Negative.   Endo/Heme/Allergies:  Bruises/bleeds easily.  Psychiatric/Behavioral: Negative.          Medical History: Past Medical History      Past Medical History:  Diagnosis Date   Anxiety      History of cancer          Problem List     Patient Active Problem List  Diagnosis   Umbilical hernia without obstruction or gangrene   Non-recurrent unilateral inguinal hernia without obstruction or gangrene   Aortic atherosclerosis (CMS-HCC)   Complete heart block (CMS/HHS-HCC)   Essential hypertension   Hearing loss   HH (hiatus hernia)   History of prostate cancer   Hyperlipidemia LDL goal <130   Irritable bowel syndrome with constipation   Pacemaker   Stage 3a chronic kidney disease (CMS/HHS-HCC)        Past Surgical History       Past Surgical History:  Procedure Laterality Date   PROSTATE SURGERY            Allergies      Allergies  Allergen Reactions   Codeine Unknown        Medications Ordered Prior to Encounter        Current Outpatient Medications on File Prior to Visit  Medication Sig Dispense Refill   amLODIPine (NORVASC) 5 MG tablet Take 1 tablet by mouth once daily       cholecalciferol 1000 unit tablet Take by mouth       hydroCHLOROthiazide (HYDRODIURIL) 12.5 MG tablet Take 1 tablet by mouth once daily       loratadine (CLARITIN) 10 mg tablet Take 10 mg by  mouth once daily       omeprazole (PRILOSEC) 20 MG DR capsule Take 1 capsule by mouth 2 (two) times daily before meals       rosuvastatin (CRESTOR) 20 MG tablet Take 1 tablet by mouth once daily        No current facility-administered medications on file prior to visit.        Family History  History reviewed. No pertinent family history.      Tobacco Use History  Social History        Tobacco Use  Smoking Status Former   Types: Cigarettes  Smokeless Tobacco Never        Social History  Social History         Socioeconomic History   Marital status: Married  Tobacco Use   Smoking status: Former      Types: Cigarettes   Smokeless tobacco: Never  Vaping Use   Vaping status: Never Used  Substance and Sexual Activity   Alcohol use: Not Currently   Drug use: Never         Objective:         Vitals:    03/03/23 0947  BP: (!) 155/70  Pulse: 59  Temp: 36.8 C (98.2 F)  SpO2: 96%  Weight: 64 kg (141 lb 3.2 oz)  Height: 165.1 cm (5\' 5" )  PainSc: 0-No pain    Body mass index is 23.5 kg/m.   Physical Exam    Constitutional:  WDWN in NAD, conversant, no obvious deformities; lying in bed comfortably Eyes:  Pupils equal, round; sclera anicteric; moist conjunctiva; no lid lag HENT:  Oral mucosa moist; good dentition  Neck:  No masses palpated, trachea midline; no thyromegaly Lungs:  CTA bilaterally; normal respiratory effort CV:  Regular rate and rhythm; no murmurs; extremities well-perfused with no edema Abd:  +bowel sounds, soft, non-tender, no palpable organomegaly; treating umbilical hernia that is reducible.  There is palpable omentum within the hernia sac.  Defect is palpated at 2 cm. GU: Bilateral descended testes, no testicular masses, no signs of right inguinal hernia.  Patient has a large left inguinal hernia that is reducible when he is supine. Musc: Unsteady gait; ambulates with a cane.  No apparent clubbing or cyanosis in extremities Lymphatic:  No palpable cervical or axillary lymphadenopathy Skin:  Warm, dry; no sign of jaundice Psychiatric - alert and oriented x 4; calm mood and affect       Assessment and Plan:  Diagnoses and all orders for this visit:   Non-recurrent unilateral inguinal hernia without obstruction or gangrene   Umbilical hernia without obstruction or gangrene       We will first obtain cardiac clearance regarding his pacemaker.  We were then schedule him for a left inguinal hernia repair with mesh and an umbilical hernia repair with mesh.  The patient carries a diagnosis of irritable bowel syndrome but is possible that these enlarging hernias may be causing intermittent partial obstructions.   The surgical procedure has been discussed with the patient.  Potential risks, benefits, alternative treatments, and  expected outcomes have been explained.  All of the patient's questions at this time have been answered.  The likelihood of reaching the patient's treatment goal is good.  The patient understands the proposed surgical procedure and wishes to proceed.   Kacee Sukhu Delbert Harness, MD  03/03/2023 11:56 AM

## 2023-03-03 NOTE — Telephone Encounter (Signed)
   Pre-operative Risk Assessment    Patient Name: [name]  DOB: Mar 15, 1932 MRN: [id number]      Request for Surgical Clearance    Procedure:   HERNIA SURGERY  Date of Surgery:  Clearance TBD                                 Surgeon:  DR. MATTHEW TSUEI Surgeon's Group or Practice Name:  [facility] Phone number:  [phone] Fax number:  [phone] ATTN: Michel Bickers, LPN   Type of Clearance Requested:   - Medical ; NO MEDICATIONS ARE LISTED TO BE HELD DEVICE CLEARANCE IS BEING REQUESTED AS WELL; I WILL FORWARD TO DEVICE CLINIC AS WELL.   Type of Anesthesia:  General    Additional requests/questions:    [name]   03/03/2023, 2:22 PM

## 2023-03-03 NOTE — Telephone Encounter (Signed)
   Name: DIYARI CAMINERO  DOB: 15-Feb-1932  MRN: [id number]  Primary Cardiologist: None  Chart reviewed as part of pre-operative protocol coverage. Because of JHON JUDE past medical history and time since last visit, he will require a follow-up in-office visit in order to better assess preoperative cardiovascular risk. Last seen 12/06/2018  Pre-op covering staff: - Please schedule appointment and call patient to inform them. If patient already had an upcoming appointment within acceptable timeframe, please add "pre-op clearance" to the appointment notes so provider is aware. - Please contact requesting surgeon's office via preferred method (i.e, phone, fax) to inform them of need for appointment prior to surgery.   Joni Reining, NP  03/03/2023, 4:51 PM

## 2023-03-03 NOTE — H&P (View-Only) (Signed)
 Subjective    Chief Complaint: New Consultation (HERNIA)       History of Present Illness: [name] is a 89 y.o. male who is seen today as an office consultation at the request of Dr. Susann Givens for evaluation of New Consultation (HERNIA) .     This is an 89 year old male referred by PCP who presents with an enlarging mass in his left groin.  This resolves when he is supine.  He denies any obstructive symptoms.  He does report some discomfort associated with this.  He is referred to [place] for further evaluation.   The patient had a CT of the pelvis without contrast in January of this year that did not show any sign of hernia.  He had a CT of the abdomen and pelvis with contrast in October 2023 that also did not show any sign of inguinal hernia.   The patient has multiple comorbidities including permanent pacemaker, history of SVT, first-degree AV block, aortic atherosclerosis.  He is not on any blood thinners.  He lives independently with his wife.  He still drives.  He states that he is fairly active.  He has significant issues with constipation that seems to be worse with the enlarging hernia.  He reports minimal discomfort at his umbilicus.   The patient is accompanied by his daughter today.   Review of Systems: A complete review of systems was obtained from the patient.  I have reviewed this information and discussed as appropriate with the patient.  See HPI as well for other ROS.   Review of Systems  Constitutional: Negative.   HENT:  Positive for hearing loss.   Eyes: Negative.   Respiratory: Negative.    Cardiovascular: Negative.   Gastrointestinal:  Positive for constipation.  Genitourinary: Negative.   Musculoskeletal: Negative.   Skin:  Positive for itching.  Neurological: Negative.   Endo/Heme/Allergies:  Bruises/bleeds easily.  Psychiatric/Behavioral: Negative.          Medical History: Past Medical History      Past Medical History:  Diagnosis Date   Anxiety      History of cancer          Problem List     Patient Active Problem List  Diagnosis   Umbilical hernia without obstruction or gangrene   Non-recurrent unilateral inguinal hernia without obstruction or gangrene   Aortic atherosclerosis (CMS-HCC)   Complete heart block (CMS/HHS-HCC)   Essential hypertension   Hearing loss   HH (hiatus hernia)   History of prostate cancer   Hyperlipidemia LDL goal <130   Irritable bowel syndrome with constipation   Pacemaker   Stage 3a chronic kidney disease (CMS/HHS-HCC)        Past Surgical History       Past Surgical History:  Procedure Laterality Date   PROSTATE SURGERY            Allergies      Allergies  Allergen Reactions   Codeine Unknown        Medications Ordered Prior to Encounter        Current Outpatient Medications on File Prior to Visit  Medication Sig Dispense Refill   amLODIPine (NORVASC) 5 MG tablet Take 1 tablet by mouth once daily       cholecalciferol 1000 unit tablet Take by mouth       hydroCHLOROthiazide (HYDRODIURIL) 12.5 MG tablet Take 1 tablet by mouth once daily       loratadine (CLARITIN) 10 mg tablet Take 10 mg by  mouth once daily       omeprazole (PRILOSEC) 20 MG DR capsule Take 1 capsule by mouth 2 (two) times daily before meals       rosuvastatin (CRESTOR) 20 MG tablet Take 1 tablet by mouth once daily        No current facility-administered medications on file prior to visit.        Family History  History reviewed. No pertinent family history.      Tobacco Use History  Social History        Tobacco Use  Smoking Status Former   Types: Cigarettes  Smokeless Tobacco Never        Social History  Social History         Socioeconomic History   Marital status: Married  Tobacco Use   Smoking status: Former      Types: Cigarettes   Smokeless tobacco: Never  Vaping Use   Vaping status: Never Used  Substance and Sexual Activity   Alcohol use: Not Currently   Drug use: Never         Objective:         Vitals:    03/03/23 0947  BP: (!) 155/70  Pulse: 59  Temp: 36.8 C (98.2 F)  SpO2: 96%  Weight: 64 kg (141 lb 3.2 oz)  Height: 165.1 cm (5\' 5" )  PainSc: 0-No pain    Body mass index is 23.5 kg/m.   Physical Exam    Constitutional:  WDWN in NAD, conversant, no obvious deformities; lying in bed comfortably Eyes:  Pupils equal, round; sclera anicteric; moist conjunctiva; no lid lag HENT:  Oral mucosa moist; good dentition  Neck:  No masses palpated, trachea midline; no thyromegaly Lungs:  CTA bilaterally; normal respiratory effort CV:  Regular rate and rhythm; no murmurs; extremities well-perfused with no edema Abd:  +bowel sounds, soft, non-tender, no palpable organomegaly; treating umbilical hernia that is reducible.  There is palpable omentum within the hernia sac.  Defect is palpated at 2 cm. GU: Bilateral descended testes, no testicular masses, no signs of right inguinal hernia.  Patient has a large left inguinal hernia that is reducible when he is supine. Musc: Unsteady gait; ambulates with a cane.  No apparent clubbing or cyanosis in extremities Lymphatic:  No palpable cervical or axillary lymphadenopathy Skin:  Warm, dry; no sign of jaundice Psychiatric - alert and oriented x 4; calm mood and affect       Assessment and Plan:  Diagnoses and all orders for this visit:   Non-recurrent unilateral inguinal hernia without obstruction or gangrene   Umbilical hernia without obstruction or gangrene       We will first obtain cardiac clearance regarding his pacemaker.  We were then schedule him for a left inguinal hernia repair with mesh and an umbilical hernia repair with mesh.  The patient carries a diagnosis of irritable bowel syndrome but is possible that these enlarging hernias may be causing intermittent partial obstructions.   The surgical procedure has been discussed with the patient.  Potential risks, benefits, alternative treatments, and  expected outcomes have been explained.  All of the patient's questions at this time have been answered.  The likelihood of reaching the patient's treatment goal is good.  The patient understands the proposed surgical procedure and wishes to proceed.   Kacee Sukhu Delbert Harness, MD  03/03/2023 11:56 AM

## 2023-03-06 NOTE — Telephone Encounter (Signed)
Pt has been scheduled to see Dr. Ladona Ridgel 03/14/23. I will update all parties involved.

## 2023-03-13 NOTE — Telephone Encounter (Signed)
noted 

## 2023-03-14 ENCOUNTER — Encounter: Payer: Self-pay | Admitting: Internal Medicine

## 2023-03-14 ENCOUNTER — Ambulatory Visit: Payer: Medicare Other | Admitting: Internal Medicine

## 2023-03-14 ENCOUNTER — Other Ambulatory Visit: Payer: Medicare Other

## 2023-03-14 VITALS — BP 152/82 | HR 73 | Ht 65.0 in | Wt 140.0 lb

## 2023-03-14 DIAGNOSIS — I442 Atrioventricular block, complete: Secondary | ICD-10-CM | POA: Diagnosis not present

## 2023-03-14 LAB — CUP PACEART INCLINIC DEVICE CHECK
Date Time Interrogation Session: 20240820171749
Implantable Lead Connection Status: 753985
Implantable Lead Connection Status: 753985
Implantable Lead Implant Date: 20180620
Implantable Lead Implant Date: 20180620
Implantable Lead Location: 753859
Implantable Lead Location: 753860
Implantable Lead Model: 5076
Implantable Lead Model: 5076
Implantable Pulse Generator Implant Date: 20180620

## 2023-03-14 NOTE — Patient Instructions (Addendum)
Medication Instructions:  Your physician recommends that you continue on your current medications as directed. Please refer to the Current Medication list given to you today.  *If you need a refill on your cardiac medications before your next appointment, please call your pharmacy*  Lab Work: None ordered.  If you have labs (blood work) drawn today and your tests are completely normal, you will receive your results only by: MyChart Message (if you have MyChart) OR A paper copy in the mail If you have any lab test that is abnormal or we need to change your treatment, we will call you to review the results.  Testing/Procedures: None ordered.  Follow-Up: At [facility], you and your health needs are our priority.  As part of our continuing mission to provide you with exceptional heart care, we have created designated Provider Care Teams.  These Care Teams include your primary Cardiologist (physician) and Advanced Practice Providers (APPs -  Physician Assistants and Nurse Practitioners) who all work together to provide you with the care you need, when you need it.   Your next appointment:   2 year(s)  The format for your next appointment:   In Person  Provider:   Lewayne Bunting, MD{or one of the following Advanced Practice Providers on your designated Care Team:   Francis Dowse, [place] Casimiro Needle "Mardelle Matte" [place], [place] Earnest Rosier, NP   Important Information About Sugar

## 2023-03-14 NOTE — Progress Notes (Signed)
HPI [name] returns today after an over 4 year absence from our EP clinic. He is a pleasant 89 yo man with a remote h/o SVT, who after many years developed CHB and underwent PPM insertion, complicated by the RV lead being caught in the TV. He eventually did well. In the interim he notes that he has developed a hernia and would like to have it fixed. He is able to walk without limit and this is corroborated by his daughter.  Allergies  Allergen Reactions   Codeine Nausea And Vomiting     Current Outpatient Medications  Medication Sig Dispense Refill   acetaminophen (TYLENOL) 650 MG CR tablet Take 650 mg by mouth daily as needed for pain.     alclomethasone (ACLOVATE) 0.05 % cream APPLY TWICE DAILY 30 g 3   amLODipine (NORVASC) 5 MG tablet TAKE 1 TABLET BY MOUTH DAILY 90 tablet 3   hydrochlorothiazide (HYDRODIURIL) 12.5 MG tablet TAKE 1 TABLET BY MOUTH DAILY 90 tablet 1   omeprazole (PRILOSEC) 20 MG capsule TAKE 1 CAPSULE BY MOUTH  TWICE DAILY BEFORE MEALS 180 capsule 3   rifaximin (XIFAXAN) 550 MG TABS tablet Take 1 tablet (550 mg total) by mouth 3 (three) times daily. 42 tablet 0   rosuvastatin (CRESTOR) 20 MG tablet TAKE 1 TABLET BY MOUTH DAILY 90 tablet 1   triamcinolone cream (KENALOG) 0.1 % APPLY TOPICALLY TO AFFECTED  AREA(S) TWICE DAILY 90 g 1   No current facility-administered medications for this visit.     Past Medical History:  Diagnosis Date   1st degree AV block    Aortic atherosclerosis (HCC) 05/03/2022   Arthritis    "hands, back, neck, shoulders, knees" (01/11/2017)   Eczema    GERD (gastroesophageal reflux disease)    HH (hiatus hernia)    History of blood transfusion    "maybe; w/prostate OR; they would have used my own" (01/11/2017)   History of colonic polyps    "I don't think I've had any polyps" (01/11/2017)   Hypercholesterolemia    Hypertension    Presence of permanent cardiac pacemaker    Prostate cancer (HCC) 1997   Seasonal allergies    SVT  (supraventricular tachycardia)     ROS:   All systems reviewed and negative except as noted in the HPI.   Past Surgical History:  Procedure Laterality Date   AV NODE ABLATION  2000s X 2   AVNODE REENTRANT ABLATION   CATARACT EXTRACTION W/ INTRAOCULAR LENS  IMPLANT, BILATERAL     COLONOSCOPY  2007   MAGOD   INSERT / REPLACE / REMOVE PACEMAKER  01/11/2017   PACEMAKER IMPLANT N/A 01/11/2017   Procedure: Pacemaker Implant;  Surgeon: Marinus Maw, MD;  Location: [facility] INVASIVE CV LAB;  Service: Cardiovascular;  Laterality: N/A;   PROSTATECTOMY     SKIN BIOPSY Right 02/04/2019   verruca vulgaris, irritated   TESTICLE SURGERY Left 2010   "benign growth"; KIMBROUGH   TONSILLECTOMY       Family History  Problem Relation Age of Onset   Cancer Mother    Alcoholism Father      Social History   Socioeconomic History   Marital status: Married    Spouse name: Not on file   Number of children: Not on file   Years of education: Not on file   Highest education level: Not on file  Occupational History   Occupation: retired  Tobacco Use   Smoking status: Former  Current packs/day: 0.00    Average packs/day: 1.5 packs/day for 13.0 years (19.5 ttl pk-yrs)    Types: Cigarettes    Start date: 08/25/1949    Quit date: 08/25/1962    Years since quitting: 60.5   Smokeless tobacco: Never  Vaping Use   Vaping status: Never Used  Substance and Sexual Activity   Alcohol use: No   Drug use: No   Sexual activity: Yes  Other Topics Concern   Not on file  Social History Narrative   Originally from [place]   Social Determinants of Health   Financial Resource Strain: Not on file  Food Insecurity: Not on file  Transportation Needs: Not on file  Physical Activity: Not on file  Stress: Not on file  Social Connections: Not on file  Intimate Partner Violence: Not on file     BP (!) 152/82   Pulse 73   Ht 5\' 5"  (1.651 m)   Wt 140 lb (63.5 kg)   SpO2 97%   BMI 23.30 kg/m   Physical  Exam:  Well appearing NAD HEENT: Unremarkable Neck:  No JVD, no thyromegally Lymphatics:  No adenopathy Back:  No CVA tenderness Lungs:  Clear with no wheezes HEART:  Regular rate rhythm, no murmurs, no rubs, no clicks Abd:  soft, positive bowel sounds, no organomegally, no rebound, no guarding Ext:  2 plus pulses, no edema, no cyanosis, no clubbing Skin:  No rashes no nodules Neuro:  CN II through XII intact, motor grossly intact  EKG - NSR with ventricular pacing  DEVICE  Normal device function.  See PaceArt for details.   Assess/Plan: Preop eval - he is an acceptable risk for hernia surgery. As he remains active he may proceed without additional evaluation. PPM - he has MRI compatible PPM. He will need a magnet placed over the PPM if electrocautery is required although he is not pacemaker dependent. Today his ventricular escape is in the 40's. HTN - his bp is up today but is usually so in the doctors office. We will follow. CHB - as noted above. He remains asymptomatic.   Joseph Gowda Artin Mceuen,MD

## 2023-03-15 ENCOUNTER — Other Ambulatory Visit: Payer: Medicare Other

## 2023-03-15 ENCOUNTER — Telehealth: Payer: Self-pay | Admitting: Family Medicine

## 2023-03-15 DIAGNOSIS — I10 Essential (primary) hypertension: Secondary | ICD-10-CM

## 2023-03-15 MED ORDER — HYDROCHLOROTHIAZIDE 12.5 MG PO TABS
12.5000 mg | ORAL_TABLET | Freq: Every day | ORAL | 0 refills | Status: DC
Start: 2023-03-15 — End: 2023-05-16

## 2023-03-15 MED ORDER — AMLODIPINE BESYLATE 5 MG PO TABS
5.0000 mg | ORAL_TABLET | Freq: Every day | ORAL | 0 refills | Status: DC
Start: 2023-03-15 — End: 2023-05-16

## 2023-03-15 NOTE — Telephone Encounter (Signed)
Fax refill request  Amlodipine  HCTZ

## 2023-03-21 NOTE — Patient Instructions (Addendum)
SURGICAL WAITING ROOM VISITATION Patients having surgery or a procedure may have no more than 2 support people in the waiting area - these visitors may rotate.    Children under the age of 64 must have an adult with them who is not the patient.  If the patient needs to stay at the hospital during part of their recovery, the visitor guidelines for inpatient rooms apply. Pre-op nurse will coordinate an appropriate time for 1 support person to accompany patient in pre-op.  This support person may not rotate.    Please refer to the [facility] website for the visitor guidelines for Inpatients (after your surgery is over and you are in a regular room).       Your procedure is scheduled on: 03-23-23   Report to [facility] Main Entrance    Report to admitting at 7:15 AM   Call this number if you have problems the morning of surgery [phone]   Do not eat food :After Midnight.   After Midnight you may have the following liquids until 6:30 AM DAY OF SURGERY  Water Non-Citrus Juices (without pulp, NO RED-Apple, White grape, White cranberry) Black Coffee (NO MILK/CREAM OR CREAMERS, sugar ok)  Clear Tea (NO MILK/CREAM OR CREAMERS, sugar ok) regular and decaf                             Plain Jell-O (NO RED)                                           Fruit ices (not with fruit pulp, NO RED)                                     Popsicles (NO RED)                                                               Sports drinks like Gatorade (NO RED)                       If you have questions, please contact your surgeon's office.   FOLLOW  ANY ADDITIONAL PRE OP INSTRUCTIONS YOU RECEIVED FROM YOUR SURGEON'S OFFICE!!!     Oral Hygiene is also important to reduce your risk of infection.                                    Remember - BRUSH YOUR TEETH THE MORNING OF SURGERY WITH YOUR REGULAR TOOTHPASTE   Do NOT smoke after Midnight   Take these medicines the morning of surgery with A SIP  OF WATER:   Amlodipine  Omeprazole  Rosuvastatin  Tylenol if needed  Stop all vitamins and herbal supplements 7 days before surgery                              You may not have any metal on your body including  jewelry, and body piercing             Do not wear  lotions, powders, cologne, or deodorant              Men may shave face and neck.   Do not bring valuables to the hospital. [facility] IS NOT RESPONSIBLE   FOR VALUABLES.   Contacts, dentures or bridgework may not be worn into surgery.   Bring small overnight bag day of surgery.   DO NOT BRING YOUR HOME MEDICATIONS TO THE HOSPITAL. PHARMACY WILL DISPENSE MEDICATIONS LISTED ON YOUR MEDICATION LIST TO YOU DURING YOUR ADMISSION IN THE HOSPITAL!    Special Instructions: Bring a copy of your healthcare power of attorney and living will documents the day of surgery if you haven't scanned them before.              Please read over the following fact sheets you were given: IF YOU HAVE QUESTIONS ABOUT YOUR PRE-OP INSTRUCTIONS PLEASE CALL [phone]  If you received a COVID test during your pre-op visit  it is requested that you wear a mask when out in public, stay away from anyone that may not be feeling well and notify your surgeon if you develop symptoms. If you test positive for Covid or have been in contact with anyone that has tested positive in the last 10 days please notify you surgeon.  [facility] - Preparing for Surgery Before surgery, you can play an important role.  Because skin is not sterile, your skin needs to be as free of germs as possible.  You can reduce the number of germs on your skin by washing with CHG (chlorahexidine gluconate) soap before surgery.  CHG is an antiseptic cleaner which kills germs and bonds with the skin to continue killing germs even after washing. Please DO NOT use if you have an allergy to CHG or antibacterial soaps.  If your skin becomes reddened/irritated stop using the CHG and inform your  nurse when you arrive at Short Stay. Do not shave (including legs and underarms) for at least 48 hours prior to the first CHG shower.  You may shave your face/neck.  Please follow these instructions carefully:  1.  Shower with CHG Soap the night before surgery and the  morning of surgery.  2.  If you choose to wash your hair, wash your hair first as usual with your normal  shampoo.  3.  After you shampoo, rinse your hair and body thoroughly to remove the shampoo.                             4.  Use CHG as you would any other liquid soap.  You can apply chg directly to the skin and wash.  Gently with a scrungie or clean washcloth.  5.  Apply the CHG Soap to your body ONLY FROM THE NECK DOWN.   Do   not use on face/ open                           Wound or open sores. Avoid contact with eyes, ears mouth and   genitals (private parts).                       Wash face,  Genitals (private parts) with your normal soap.  6.  Wash thoroughly, paying special attention to the area where your    surgery  will be performed.  7.  Thoroughly rinse your body with warm water from the neck down.  8.  DO NOT shower/wash with your normal soap after using and rinsing off the CHG Soap.                9.  Pat yourself dry with a clean towel.            10.  Wear clean pajamas.            11.  Place clean sheets on your bed the night of your first shower and do not  sleep with pets. Day of Surgery : Do not apply any lotions/deodorants the morning of surgery.  Please wear clean clothes to the hospital/surgery center.  FAILURE TO FOLLOW THESE INSTRUCTIONS MAY RESULT IN THE CANCELLATION OF YOUR SURGERY  PATIENT SIGNATURE_________________________________  NURSE SIGNATURE__________________________________  ________________________________________________________________________

## 2023-03-22 ENCOUNTER — Inpatient Hospital Stay (HOSPITAL_COMMUNITY): Admission: RE | Admit: 2023-03-22 | Payer: Medicare Other | Source: Ambulatory Visit

## 2023-03-22 ENCOUNTER — Encounter (HOSPITAL_COMMUNITY): Payer: Self-pay

## 2023-03-22 ENCOUNTER — Ambulatory Visit: Payer: Medicare Other | Admitting: Podiatry

## 2023-03-22 ENCOUNTER — Other Ambulatory Visit: Payer: Self-pay

## 2023-03-22 ENCOUNTER — Encounter: Payer: Self-pay | Admitting: Podiatry

## 2023-03-22 VITALS — BP 178/68 | HR 99 | Temp 97.7°F | Resp 16 | Ht 65.0 in | Wt 137.0 lb

## 2023-03-22 DIAGNOSIS — Z01812 Encounter for preprocedural laboratory examination: Secondary | ICD-10-CM | POA: Diagnosis not present

## 2023-03-22 DIAGNOSIS — K429 Umbilical hernia without obstruction or gangrene: Secondary | ICD-10-CM | POA: Insufficient documentation

## 2023-03-22 DIAGNOSIS — K4090 Unilateral inguinal hernia, without obstruction or gangrene, not specified as recurrent: Secondary | ICD-10-CM | POA: Diagnosis not present

## 2023-03-22 DIAGNOSIS — I2510 Atherosclerotic heart disease of native coronary artery without angina pectoris: Secondary | ICD-10-CM | POA: Diagnosis not present

## 2023-03-22 DIAGNOSIS — I10 Essential (primary) hypertension: Secondary | ICD-10-CM | POA: Diagnosis not present

## 2023-03-22 DIAGNOSIS — M79674 Pain in right toe(s): Secondary | ICD-10-CM | POA: Diagnosis not present

## 2023-03-22 DIAGNOSIS — B351 Tinea unguium: Secondary | ICD-10-CM | POA: Diagnosis not present

## 2023-03-22 DIAGNOSIS — Z8546 Personal history of malignant neoplasm of prostate: Secondary | ICD-10-CM | POA: Diagnosis not present

## 2023-03-22 DIAGNOSIS — Z01818 Encounter for other preprocedural examination: Secondary | ICD-10-CM | POA: Diagnosis present

## 2023-03-22 DIAGNOSIS — Z950 Presence of cardiac pacemaker: Secondary | ICD-10-CM | POA: Diagnosis not present

## 2023-03-22 DIAGNOSIS — L600 Ingrowing nail: Secondary | ICD-10-CM | POA: Diagnosis not present

## 2023-03-22 DIAGNOSIS — M79675 Pain in left toe(s): Secondary | ICD-10-CM | POA: Diagnosis not present

## 2023-03-22 LAB — CBC
HCT: 45 % (ref 39.0–52.0)
Hemoglobin: 14.9 g/dL (ref 13.0–17.0)
MCH: 30.5 pg (ref 26.0–34.0)
MCHC: 33.1 g/dL (ref 30.0–36.0)
MCV: 92.2 fL (ref 80.0–100.0)
Platelets: 159 10*3/uL (ref 150–400)
RBC: 4.88 MIL/uL (ref 4.22–5.81)
RDW: 12.7 % (ref 11.5–15.5)
WBC: 7.5 10*3/uL (ref 4.0–10.5)
nRBC: 0 % (ref 0.0–0.2)

## 2023-03-22 LAB — BASIC METABOLIC PANEL
Anion gap: 10 (ref 5–15)
BUN: 14 mg/dL (ref 8–23)
CO2: 25 mmol/L (ref 22–32)
Calcium: 9.5 mg/dL (ref 8.9–10.3)
Chloride: 98 mmol/L (ref 98–111)
Creatinine, Ser: 0.9 mg/dL (ref 0.61–1.24)
GFR, Estimated: >60 mL/min (ref >60)
Glucose, Bld: 118 mg/dL — ABNORMAL HIGH (ref 70–99)
Potassium: 3.2 mmol/L — ABNORMAL LOW (ref 3.5–5.1)
Sodium: 133 mmol/L — ABNORMAL LOW (ref 135–145)

## 2023-03-22 NOTE — Anesthesia Preprocedure Evaluation (Addendum)
Anesthesia Evaluation  Patient identified by MRN, date of birth, ID band Patient awake    Reviewed: Allergy & Precautions, NPO status , Patient's Chart, lab work & pertinent test results  Airway Mallampati: II  TM Distance: >3 FB Neck ROM: Full    Dental no notable dental hx.    Pulmonary former smoker   Pulmonary exam normal breath sounds clear to auscultation       Cardiovascular hypertension, Pt. on medications + Peripheral Vascular Disease  Normal cardiovascular exam+ dysrhythmias + pacemaker  Rhythm:Regular Rate:Normal  Echo  - Left ventricle: The cavity size was normal. Wall thickness was increased in a pattern of mild LVH. Systolic function was normal. The estimated ejection fraction was in the range of 60% to 65%.  Wall motion was normal; there were no regional wall motion  abnormalities. Features are consistent with a pseudonormal left ventricular filling pattern, with concomitant abnormal relaxation and increased filling pressure (grade 2 diastolic dysfunction).  - Aortic valve: There was mild regurgitation.  - Mitral valve: Valve area by pressure half-time: 2.1 cm^2.      Neuro/Psych  Neuromuscular disease    GI/Hepatic Neg liver ROS, hiatal hernia,GERD  ,,  Endo/Other  negative endocrine ROS    Renal/GU Renal disease     Musculoskeletal  (+) Arthritis ,    Abdominal   Peds  Hematology negative hematology ROS (+)   Anesthesia Other Findings   Reproductive/Obstetrics                              Anesthesia Physical Anesthesia Plan  ASA: 3  Anesthesia Plan: General   Post-op Pain Management: Tylenol PO (pre-op)* and Regional block*   Induction: Intravenous  PONV Risk Score and Plan: Ondansetron, Dexamethasone and Treatment may vary due to age or medical condition  Airway Management Planned: Oral ETT  Additional Equipment: None  Intra-op Plan:   Post-operative Plan:  Extubation in OR  Informed Consent: I have reviewed the patients History and Physical, chart, labs and discussed the procedure including the risks, benefits and alternatives for the proposed anesthesia with the patient or authorized representative who has indicated his/her understanding and acceptance.     Dental advisory given  Plan Discussed with: CRNA  Anesthesia Plan Comments: (See PAT note 03/22/2023)        Anesthesia Quick Evaluation

## 2023-03-22 NOTE — Progress Notes (Addendum)
PCP - Sharlot Gowda, MD Cardiologist - Lewayne Bunting, MD  Clearance 03-14-23  PPM/ICD -  Device Orders - ON chart Rep Notified -  Last device check 03-14-23   Chest x-ray -  EKG - 03-14-23 epic Stress Test -  ECHO - 2018 epic Cardiac Cath -   Sleep Study -  CPAP -   Fasting Blood Sugar -  Checks Blood Sugar _____ times a day  Blood Thinner Instructions: Aspirin Instructions:  ERAS Protcol -Y PRE-SURGERY no drink    COVID vaccine -yes  Activity--Able to complete ADL's without CP or SOB Anesthesia review: HTN, complete heart block, CKD, pacemaker  Patient denies shortness of breath, fever, cough and chest pain at PAT appointment   All instructions explained to the patient, with a verbal understanding of the material. Patient agrees to go over the instructions while at home for a better understanding. Patient also instructed to self quarantine after being tested for COVID-19. The opportunity to ask questions was provided.

## 2023-03-22 NOTE — Progress Notes (Deleted)
PERIOPERATIVE PRESCRIPTION FOR IMPLANTED CARDIAC DEVICE PROGRAMMING  Patient Information: Name:  DELLIS ALYEA  DOB:  30-Oct-1931  MRN:  [id number]  Planned Procedure:  L inguinal hernia and umbilical hernia repair  Surgeon:  Dr. Manus Rudd  Date of Procedure:  03-23-23  Cautery will be used.Yes  Position during surgery:  Supine   Device Information:  Clinic EP Physician:  Sherryl Manges, MD   Device Type:  Pacemaker Manufacturer and Phone #:  [employer]tronic: [phone] Pacemaker Dependent?:  No. Date of Last Device Check:  03/14/23 Normal Device Function?:  Yes.    Electrophysiologist's Recommendations:  Have magnet available. Provide continuous ECG monitoring when magnet is used or reprogramming is to be performed.  Procedure should not interfere with device function.  No device programming or magnet placement needed.  Per Device Clinic Standing Orders, Skip Mayer, RN  12:07 PM 03/22/2023

## 2023-03-22 NOTE — Progress Notes (Signed)
PERIOPERATIVE PRESCRIPTION FOR IMPLANTED CARDIAC DEVICE PROGRAMMING  Patient Information: Name:  [name]  DOB:  [birth year]  MRN:  [id number]  Planned Procedure:  L inguinal hernia and umbilical hernia repair  Surgeon:  Dr. Manus Rudd  Date of Procedure:  03-23-23  Cautery will be used.Yes  Position during surgery:  Supine   Device Information:  Clinic EP Physician:  Sherryl Manges, MD   Device Type:  Pacemaker Manufacturer and Phone #:  [employer]tronic: [phone] Pacemaker Dependent?:  No. Date of Last Device Check:  03/14/23 Normal Device Function?:  Yes.    Electrophysiologist's Recommendations:  Have magnet available. Provide continuous ECG monitoring when magnet is used or reprogramming is to be performed.  Procedure may interfere with device function.  Magnet should be placed over device during procedure.  Per Device Clinic Standing Orders, Skip Mayer, RN  12:12 PM 03/22/2023

## 2023-03-22 NOTE — Progress Notes (Signed)
Subjective:   Patient ID: [name], male   DOB: 89 y.o.   MRN: [id number]   HPI Patient presents with ingrown toenail left big toe but due to have hernia surgery tomorrow does not want to do anything but wants to see what will be necessary   ROS      Objective:  Physical Exam  Neuro vascular status intact incurvation of the medial border left big toe sore when pressed with history of hernia to be done tomorrow     Assessment:  Ingrown toenail deformity left hallux medial border     Plan:  I recommended a temporary removal versus permanent even though that may be necessary.  I discussed the procedure risk he wants to get it done and will reappoint for procedure

## 2023-03-22 NOTE — Progress Notes (Signed)
Anesthesia Chart Review   Case: [id number] Date/Time: 03/23/23 0915   Procedures:      LEFT INGUINAL HERNIA REPAIR WITH MESH (Left)     HERNIA REPAIR UMBILICAL ADULT WITH MESH   Anesthesia type: General   Pre-op diagnosis: LEFT INGUINAL HERNIA UMBILICAL HERNIA   Location: [facility] ROOM 04 / [facility]   Surgeons: Manus Rudd, MD       DISCUSSION:89 y.o. former smoker with h/o HTN, SVT, CHB with pacemaker in place (device orders in 03/22/2023 progress note, magnet should be placed over device), prostate cancer, left inguinal hernia umbilical hernia scheduled for above procedure 03/23/2023 with Dr. Manus Rudd.   Pt last seen by EP 03/14/2023. Per OV note, "he is an acceptable risk for hernia surgery. As he remains active he may proceed without additional evaluation. "  VS: BP (!) 178/68   Pulse 99   Temp 36.5 C (Oral)   Resp 16   Ht 5\' 5"  (1.651 m)   Wt 62.1 kg   SpO2 99%   BMI 22.80 kg/m   PROVIDERS: Ronnald Nian, MD is PCP   Lewayne Bunting, MD is Cardiologist/EP LABS: Labs reviewed: Acceptable for surgery. (all labs ordered are listed, but only abnormal results are displayed)  Labs Reviewed  BASIC METABOLIC PANEL - Abnormal; Notable for the following components:      Result Value   Sodium 133 (*)    Potassium 3.2 (*)    Glucose, Bld 118 (*)    All other components within normal limits  CBC     IMAGES:   EKG:   CV: Echo 01/11/2017 - Left ventricle: The cavity size was normal. Wall thickness was    increased in a pattern of mild LVH. Systolic function was normal.    The estimated ejection fraction was in the range of 60% to 65%.    Wall motion was normal; there were no regional wall motion    abnormalities. Features are consistent with a pseudonormal left    ventricular filling pattern, with concomitant abnormal relaxation    and increased filling pressure (grade 2 diastolic dysfunction).  - Aortic valve: There was mild regurgitation.  - Mitral valve: Valve area  by pressure half-time: 2.1 cm^2.  Past Medical History:  Diagnosis Date   1st degree AV block    Complete Heart block   Aortic atherosclerosis (HCC) 05/03/2022   Arthritis    "hands, back, neck, shoulders, knees" (01/11/2017)   Eczema    GERD (gastroesophageal reflux disease)    HH (hiatus hernia)    History of blood transfusion    "maybe; w/prostate OR; they would have used my own" (01/11/2017)   History of colonic polyps    "I don't think I've had any polyps" (01/11/2017)   Hypercholesterolemia    Hypertension    Presence of permanent cardiac pacemaker    Prostate cancer (HCC) 1997   Seasonal allergies    SVT (supraventricular tachycardia)     Past Surgical History:  Procedure Laterality Date   AV NODE ABLATION  2000s X 2   AVNODE REENTRANT ABLATION   CATARACT EXTRACTION W/ INTRAOCULAR LENS  IMPLANT, BILATERAL     COLONOSCOPY  2007   MAGOD   INSERT / REPLACE / REMOVE PACEMAKER  01/11/2017   PACEMAKER IMPLANT N/A 01/11/2017   Procedure: Pacemaker Implant;  Surgeon: Marinus Maw, MD;  Location: [facility];  Service: Cardiovascular;  Laterality: N/A;   PROSTATECTOMY  1997   SKIN BIOPSY Right 02/04/2019  verruca vulgaris, irritated   TESTICLE SURGERY Left 2010   "benign growth"; [facility]OUGH   TONSILLECTOMY      MEDICATIONS:  loratadine (CLARITIN) 10 MG tablet   acetaminophen (TYLENOL) 500 MG tablet   alclomethasone (ACLOVATE) 0.05 % cream   amLODipine (NORVASC) 5 MG tablet   cholecalciferol (VITAMIN D3) 25 MCG (1000 UNIT) tablet   hydrochlorothiazide (HYDRODIURIL) 12.5 MG tablet   omeprazole (PRILOSEC) 20 MG capsule   rifaximin (XIFAXAN) 550 MG TABS tablet   rosuvastatin (CRESTOR) 20 MG tablet   triamcinolone cream (KENALOG) 0.1 %   No current facility-administered medications for this encounter.     Jodell Cipro Ward, PA-C [facility] [phone]

## 2023-03-23 ENCOUNTER — Observation Stay (HOSPITAL_COMMUNITY)
Admission: RE | Admit: 2023-03-23 | Discharge: 2023-03-24 | Disposition: A | Discharge status: Home / Self Care | Payer: Medicare Other | Attending: Surgery | Admitting: Surgery

## 2023-03-23 ENCOUNTER — Ambulatory Visit (HOSPITAL_COMMUNITY): Payer: Medicare Other | Admitting: Physician Assistant

## 2023-03-23 ENCOUNTER — Other Ambulatory Visit: Payer: Self-pay | Admitting: Surgery

## 2023-03-23 ENCOUNTER — Encounter (HOSPITAL_COMMUNITY)
Admission: RE | Disposition: A | Discharge status: Home / Self Care | Payer: Self-pay | Source: Home / Self Care | Attending: Surgery

## 2023-03-23 ENCOUNTER — Ambulatory Visit (HOSPITAL_BASED_OUTPATIENT_CLINIC_OR_DEPARTMENT_OTHER): Payer: Medicare Other | Admitting: Anesthesiology

## 2023-03-23 ENCOUNTER — Encounter (HOSPITAL_COMMUNITY): Payer: Self-pay | Admitting: Surgery

## 2023-03-23 ENCOUNTER — Other Ambulatory Visit: Payer: Self-pay

## 2023-03-23 DIAGNOSIS — I2510 Atherosclerotic heart disease of native coronary artery without angina pectoris: Principal | ICD-10-CM

## 2023-03-23 DIAGNOSIS — Z01818 Encounter for other preprocedural examination: Secondary | ICD-10-CM

## 2023-03-23 DIAGNOSIS — K4090 Unilateral inguinal hernia, without obstruction or gangrene, not specified as recurrent: Secondary | ICD-10-CM

## 2023-03-23 DIAGNOSIS — K432 Incisional hernia without obstruction or gangrene: Secondary | ICD-10-CM | POA: Insufficient documentation

## 2023-03-23 DIAGNOSIS — I10 Essential (primary) hypertension: Secondary | ICD-10-CM

## 2023-03-23 DIAGNOSIS — Z950 Presence of cardiac pacemaker: Secondary | ICD-10-CM | POA: Diagnosis not present

## 2023-03-23 DIAGNOSIS — K429 Umbilical hernia without obstruction or gangrene: Secondary | ICD-10-CM

## 2023-03-23 DIAGNOSIS — N1831 Chronic kidney disease, stage 3a: Secondary | ICD-10-CM | POA: Diagnosis not present

## 2023-03-23 DIAGNOSIS — Z79899 Other long term (current) drug therapy: Secondary | ICD-10-CM | POA: Insufficient documentation

## 2023-03-23 DIAGNOSIS — G8918 Other acute postprocedural pain: Secondary | ICD-10-CM | POA: Diagnosis not present

## 2023-03-23 DIAGNOSIS — Z87891 Personal history of nicotine dependence: Secondary | ICD-10-CM | POA: Insufficient documentation

## 2023-03-23 DIAGNOSIS — I129 Hypertensive chronic kidney disease with stage 1 through stage 4 chronic kidney disease, or unspecified chronic kidney disease: Secondary | ICD-10-CM | POA: Diagnosis not present

## 2023-03-23 HISTORY — PX: INGUINAL HERNIA REPAIR: SHX194

## 2023-03-23 HISTORY — PX: UMBILICAL HERNIA REPAIR: SHX196

## 2023-03-23 SURGERY — REPAIR, HERNIA, INGUINAL, ADULT
Anesthesia: General

## 2023-03-23 MED ORDER — TRAMADOL HCL 50 MG PO TABS
50.0000 mg | ORAL_TABLET | Freq: Four times a day (QID) | ORAL | Status: DC | PRN
  Administered 2023-03-23: 50 mg via ORAL
  Filled 2023-03-23: qty 1

## 2023-03-23 MED ORDER — FENTANYL CITRATE PF 50 MCG/ML IJ SOSY: 50.0000 ug | PREFILLED_SYRINGE | INTRAMUSCULAR | Status: DC | PRN

## 2023-03-23 MED ORDER — SUGAMMADEX SODIUM 200 MG/2ML IV SOLN
INTRAVENOUS | Status: DC | PRN
  Administered 2023-03-23: 150 mg via INTRAVENOUS

## 2023-03-23 MED ORDER — RIFAXIMIN 550 MG PO TABS: 550.0000 mg | ORAL_TABLET | Freq: Three times a day (TID) | ORAL | Status: DC

## 2023-03-23 MED ORDER — METHOCARBAMOL 500 MG PO TABS: 500.0000 mg | ORAL_TABLET | Freq: Four times a day (QID) | ORAL | Status: DC | PRN

## 2023-03-23 MED ORDER — MORPHINE SULFATE (PF) 2 MG/ML IV SOLN: 2.0000 mg | INTRAVENOUS | Status: DC | PRN

## 2023-03-23 MED ORDER — BUPIVACAINE-EPINEPHRINE 0.25% -1:200000 IJ SOLN
INTRAMUSCULAR | Status: AC
  Filled 2023-03-23: qty 1

## 2023-03-23 MED ORDER — ONDANSETRON HCL 4 MG/2ML IJ SOLN
INTRAMUSCULAR | Status: DC | PRN
  Administered 2023-03-23: 4 mg via INTRAVENOUS

## 2023-03-23 MED ORDER — ACETAMINOPHEN 500 MG PO TABS
1000.0000 mg | ORAL_TABLET | Freq: Four times a day (QID) | ORAL | Status: DC
  Administered 2023-03-23 – 2023-03-24 (×4): 1000 mg via ORAL
  Filled 2023-03-23 (×4): qty 2

## 2023-03-23 MED ORDER — DIPHENHYDRAMINE HCL 50 MG/ML IJ SOLN: 12.5000 mg | Freq: Four times a day (QID) | INTRAMUSCULAR | Status: DC | PRN

## 2023-03-23 MED ORDER — CEFAZOLIN SODIUM-DEXTROSE 2-4 GM/100ML-% IV SOLN
2.0000 g | INTRAVENOUS | Status: AC
  Administered 2023-03-23: 2 g via INTRAVENOUS
  Filled 2023-03-23: qty 100

## 2023-03-23 MED ORDER — ORAL CARE MOUTH RINSE: 15.0000 mL | Freq: Once | OROMUCOSAL | Status: AC

## 2023-03-23 MED ORDER — SODIUM CHLORIDE 0.9 % IV SOLN: INTRAVENOUS | Status: DC

## 2023-03-23 MED ORDER — HYDROCODONE-ACETAMINOPHEN 5-325 MG PO TABS: 1.0000 | ORAL_TABLET | ORAL | Status: DC | PRN

## 2023-03-23 MED ORDER — ROCURONIUM BROMIDE 100 MG/10ML IV SOLN
INTRAVENOUS | Status: DC | PRN
  Administered 2023-03-23: 40 mg via INTRAVENOUS

## 2023-03-23 MED ORDER — ONDANSETRON HCL 4 MG/2ML IJ SOLN: 4.0000 mg | Freq: Four times a day (QID) | INTRAMUSCULAR | Status: DC | PRN

## 2023-03-23 MED ORDER — ONDANSETRON HCL 4 MG/2ML IJ SOLN
INTRAMUSCULAR | Status: AC
  Filled 2023-03-23: qty 2

## 2023-03-23 MED ORDER — ENOXAPARIN SODIUM 40 MG/0.4ML IJ SOSY
40.0000 mg | PREFILLED_SYRINGE | INTRAMUSCULAR | Status: DC
  Administered 2023-03-24: 40 mg via SUBCUTANEOUS
  Filled 2023-03-23: qty 0.4

## 2023-03-23 MED ORDER — DOCUSATE SODIUM 100 MG PO CAPS
100.0000 mg | ORAL_CAPSULE | Freq: Two times a day (BID) | ORAL | Status: DC
  Administered 2023-03-23: 100 mg via ORAL
  Filled 2023-03-23 (×2): qty 1

## 2023-03-23 MED ORDER — FENTANYL CITRATE PF 50 MCG/ML IJ SOSY
PREFILLED_SYRINGE | INTRAMUSCULAR | Status: AC
  Administered 2023-03-23: 25 ug via INTRAVENOUS
  Filled 2023-03-23: qty 2

## 2023-03-23 MED ORDER — ACETAMINOPHEN 500 MG PO TABS
1000.0000 mg | ORAL_TABLET | ORAL | Status: AC
  Administered 2023-03-23: 1000 mg via ORAL
  Filled 2023-03-23: qty 2

## 2023-03-23 MED ORDER — FENTANYL CITRATE (PF) 100 MCG/2ML IJ SOLN
INTRAMUSCULAR | Status: AC
  Filled 2023-03-23: qty 2

## 2023-03-23 MED ORDER — 0.9 % SODIUM CHLORIDE (POUR BTL) OPTIME
TOPICAL | Status: DC | PRN
  Administered 2023-03-23: 1000 mL

## 2023-03-23 MED ORDER — PROPOFOL 10 MG/ML IV BOLUS
INTRAVENOUS | Status: DC | PRN
Start: 2023-03-23 — End: 2023-03-23
  Administered 2023-03-23: 80 mg via INTRAVENOUS

## 2023-03-23 MED ORDER — PHENYLEPHRINE HCL-NACL 20-0.9 MG/250ML-% IV SOLN
INTRAVENOUS | Status: DC | PRN
  Administered 2023-03-23: 30 ug/min via INTRAVENOUS

## 2023-03-23 MED ORDER — OXYCODONE HCL 5 MG PO TABS: 5.0000 mg | ORAL_TABLET | Freq: Four times a day (QID) | ORAL | Status: DC | PRN

## 2023-03-23 MED ORDER — ACETAMINOPHEN 500 MG PO TABS: 500.0000 mg | ORAL_TABLET | Freq: Four times a day (QID) | ORAL | Status: DC | PRN

## 2023-03-23 MED ORDER — ROSUVASTATIN CALCIUM 20 MG PO TABS
20.0000 mg | ORAL_TABLET | Freq: Every day | ORAL | Status: DC
  Administered 2023-03-24: 20 mg via ORAL
  Filled 2023-03-23: qty 1

## 2023-03-23 MED ORDER — PROPOFOL 10 MG/ML IV BOLUS
INTRAVENOUS | Status: AC
  Filled 2023-03-23: qty 20

## 2023-03-23 MED ORDER — BUPIVACAINE LIPOSOME 1.3 % IJ SUSP
INTRAMUSCULAR | Status: DC | PRN
Start: 2023-03-23 — End: 2023-03-23
  Administered 2023-03-23: 10 mL

## 2023-03-23 MED ORDER — FENTANYL CITRATE PF 50 MCG/ML IJ SOSY
PREFILLED_SYRINGE | INTRAMUSCULAR | Status: AC
  Administered 2023-03-23: 25 ug via INTRAVENOUS
  Filled 2023-03-23: qty 3

## 2023-03-23 MED ORDER — CHLORHEXIDINE GLUCONATE 0.12 % MT SOLN
15.0000 mL | Freq: Once | OROMUCOSAL | Status: AC
  Administered 2023-03-23: 15 mL via OROMUCOSAL

## 2023-03-23 MED ORDER — CHLORHEXIDINE GLUCONATE CLOTH 2 % EX PADS: 6.0000 | MEDICATED_PAD | Freq: Once | CUTANEOUS | Status: DC

## 2023-03-23 MED ORDER — LORATADINE 10 MG PO TABS
10.0000 mg | ORAL_TABLET | Freq: Every day | ORAL | Status: DC
  Administered 2023-03-24: 10 mg via ORAL
  Filled 2023-03-23: qty 1

## 2023-03-23 MED ORDER — ROCURONIUM BROMIDE 10 MG/ML (PF) SYRINGE
PREFILLED_SYRINGE | INTRAVENOUS | Status: AC
  Filled 2023-03-23: qty 10

## 2023-03-23 MED ORDER — DEXAMETHASONE SODIUM PHOSPHATE 10 MG/ML IJ SOLN
INTRAMUSCULAR | Status: AC
  Filled 2023-03-23: qty 1

## 2023-03-23 MED ORDER — BUPIVACAINE-EPINEPHRINE 0.25% -1:200000 IJ SOLN
INTRAMUSCULAR | Status: DC | PRN
  Administered 2023-03-23: 20 mL

## 2023-03-23 MED ORDER — DROPERIDOL 2.5 MG/ML IJ SOLN: 0.6250 mg | Freq: Once | INTRAMUSCULAR | Status: DC | PRN

## 2023-03-23 MED ORDER — AMLODIPINE BESYLATE 5 MG PO TABS
5.0000 mg | ORAL_TABLET | Freq: Every day | ORAL | Status: DC
  Administered 2023-03-23 – 2023-03-24 (×2): 5 mg via ORAL
  Filled 2023-03-23 (×2): qty 1

## 2023-03-23 MED ORDER — HYDROCHLOROTHIAZIDE 12.5 MG PO TABS
12.5000 mg | ORAL_TABLET | Freq: Every day | ORAL | Status: DC
  Administered 2023-03-24: 12.5 mg via ORAL
  Filled 2023-03-23: qty 1

## 2023-03-23 MED ORDER — ONDANSETRON 4 MG PO TBDP: 4.0000 mg | ORAL_TABLET | Freq: Four times a day (QID) | ORAL | Status: DC | PRN

## 2023-03-23 MED ORDER — FENTANYL CITRATE PF 50 MCG/ML IJ SOSY: 50.0000 ug | PREFILLED_SYRINGE | INTRAMUSCULAR | Status: DC

## 2023-03-23 MED ORDER — FENTANYL CITRATE (PF) 100 MCG/2ML IJ SOLN
INTRAMUSCULAR | Status: DC | PRN
  Administered 2023-03-23 (×2): 25 ug via INTRAVENOUS

## 2023-03-23 MED ORDER — FENTANYL CITRATE PF 50 MCG/ML IJ SOSY
25.0000 ug | PREFILLED_SYRINGE | INTRAMUSCULAR | Status: AC | PRN
  Administered 2023-03-23 (×5): 25 ug via INTRAVENOUS

## 2023-03-23 MED ORDER — DEXAMETHASONE SODIUM PHOSPHATE 4 MG/ML IJ SOLN
INTRAMUSCULAR | Status: DC | PRN
  Administered 2023-03-23: 4 mg via INTRAVENOUS

## 2023-03-23 MED ORDER — PHENYLEPHRINE HCL (PRESSORS) 10 MG/ML IV SOLN
INTRAVENOUS | Status: DC | PRN
Start: 2023-03-23 — End: 2023-03-23
  Administered 2023-03-23 (×4): 80 ug via INTRAVENOUS

## 2023-03-23 MED ORDER — BUPIVACAINE HCL (PF) 0.5 % IJ SOLN
INTRAMUSCULAR | Status: DC | PRN
  Administered 2023-03-23: 20 mL

## 2023-03-23 MED ORDER — PANTOPRAZOLE SODIUM 40 MG PO TBEC
40.0000 mg | DELAYED_RELEASE_TABLET | Freq: Every day | ORAL | Status: DC
  Administered 2023-03-24: 40 mg via ORAL
  Filled 2023-03-23: qty 1

## 2023-03-23 MED ORDER — ACETAMINOPHEN 500 MG PO TABS: 1000.0000 mg | ORAL_TABLET | Freq: Once | ORAL | Status: DC

## 2023-03-23 MED ORDER — DIPHENHYDRAMINE HCL 12.5 MG/5ML PO ELIX: 12.5000 mg | ORAL_SOLUTION | Freq: Four times a day (QID) | ORAL | Status: DC | PRN

## 2023-03-23 MED ORDER — LACTATED RINGERS IV SOLN: INTRAVENOUS | Status: DC

## 2023-03-23 SURGICAL SUPPLY — 57 items
APL PRP STRL LF DISP 70% ISPRP (MISCELLANEOUS) ×2
APL SKNCLS STERI-STRIP NONHPOA (GAUZE/BANDAGES/DRESSINGS) ×2
BAG COUNTER SPONGE SURGICOUNT (BAG) IMPLANT
BAG SPNG CNTER NS LX DISP (BAG)
BENZOIN TINCTURE PRP APPL 2/3 (GAUZE/BANDAGES/DRESSINGS) ×3 IMPLANT
BLADE HEX COATED 2.75 (ELECTRODE) ×3 IMPLANT
BLADE SURG 15 STRL LF DISP TIS (BLADE) ×3 IMPLANT
BLADE SURG 15 STRL SS (BLADE) ×2
CHLORAPREP W/TINT 26 (MISCELLANEOUS) ×3 IMPLANT
COVER SURGICAL LIGHT HANDLE (MISCELLANEOUS) ×3 IMPLANT
DISSECTOR ROUND CHERRY 3/8 STR (MISCELLANEOUS) IMPLANT
DRAIN PENROSE 0.5X18 (DRAIN) IMPLANT
DRAPE LAPAROTOMY T 102X78X121 (DRAPES) ×3 IMPLANT
DRAPE LAPAROTOMY TRNSV 102X78 (DRAPES) ×3 IMPLANT
DRAPE UTILITY XL STRL (DRAPES) ×3 IMPLANT
DRSG TEGADERM 4X4.75 (GAUZE/BANDAGES/DRESSINGS) ×3 IMPLANT
ELECT REM PT RETURN 15FT ADLT (MISCELLANEOUS) ×3 IMPLANT
GAUZE 4X4 16PLY ~~LOC~~+RFID DBL (SPONGE) IMPLANT
GAUZE SPONGE 4X4 12PLY STRL (GAUZE/BANDAGES/DRESSINGS) ×3 IMPLANT
GLOVE BIO SURGEON STRL SZ7 (GLOVE) ×3 IMPLANT
GLOVE BIOGEL PI IND STRL 7.5 (GLOVE) ×3 IMPLANT
GOWN STRL REUS W/ TWL LRG LVL3 (GOWN DISPOSABLE) ×3 IMPLANT
GOWN STRL REUS W/TWL LRG LVL3 (GOWN DISPOSABLE) ×2
KIT BASIN OR (CUSTOM PROCEDURE TRAY) ×3 IMPLANT
KIT TURNOVER KIT A (KITS) IMPLANT
MARKER SKIN DUAL TIP RULER LAB (MISCELLANEOUS) ×3 IMPLANT
MESH PARIETEX PROGRIP LEFT (Mesh General) IMPLANT
MESH VENTRALEX ST 1-7/10 CRC S (Mesh General) IMPLANT
NDL HYPO 22X1.5 SAFETY MO (MISCELLANEOUS) ×3 IMPLANT
NDL HYPO 25X1 1.5 SAFETY (NEEDLE) ×3 IMPLANT
NEEDLE HYPO 22X1.5 SAFETY MO (MISCELLANEOUS) ×2
NEEDLE HYPO 25X1 1.5 SAFETY (NEEDLE) ×2
PACK BASIC VI WITH GOWN DISP (CUSTOM PROCEDURE TRAY) ×3 IMPLANT
PACK GENERAL/GYN (CUSTOM PROCEDURE TRAY) ×3 IMPLANT
PENCIL SMOKE EVACUATOR (MISCELLANEOUS) IMPLANT
SPIKE FLUID TRANSFER (MISCELLANEOUS) ×3 IMPLANT
SPONGE T-LAP 4X18 ~~LOC~~+RFID (SPONGE) ×3 IMPLANT
STRIP CLOSURE SKIN 1/2X4 (GAUZE/BANDAGES/DRESSINGS) ×3 IMPLANT
SUT MNCRL AB 4-0 PS2 18 (SUTURE) ×3 IMPLANT
SUT NOVA NAB GS-21 0 18 T12 DT (SUTURE) IMPLANT
SUT PROLENE 0 CT 2 (SUTURE) IMPLANT
SUT PROLENE 2 0 SH DA (SUTURE) IMPLANT
SUT SILK 3 0 (SUTURE)
SUT SILK 3-0 18XBRD TIE 12 (SUTURE) IMPLANT
SUT VIC AB 2-0 CT2 27 (SUTURE) IMPLANT
SUT VIC AB 2-0 SH 27 (SUTURE) ×2
SUT VIC AB 2-0 SH 27X BRD (SUTURE) ×3 IMPLANT
SUT VIC AB 3-0 SH 27 (SUTURE) ×6
SUT VIC AB 3-0 SH 27X BRD (SUTURE) ×3 IMPLANT
SUT VIC AB 3-0 SH 27XBRD (SUTURE) ×3 IMPLANT
SUT VICRYL 0 27 CT2 27 ABS (SUTURE) IMPLANT
SUT VICRYL 0 UR6 27IN ABS (SUTURE) IMPLANT
SYR 20ML LL LF (SYRINGE) ×3 IMPLANT
SYR CONTROL 10ML LL (SYRINGE) ×3 IMPLANT
TAPE STRIPS DRAPE STRL (GAUZE/BANDAGES/DRESSINGS) IMPLANT
TOWEL OR 17X26 10 PK STRL BLUE (TOWEL DISPOSABLE) ×3 IMPLANT
TOWEL OR NON WOVEN STRL DISP B (DISPOSABLE) ×3 IMPLANT

## 2023-03-23 NOTE — Anesthesia Postprocedure Evaluation (Signed)
Anesthesia Post Note  Patient: [name]  Procedure(s) Performed: LEFT INGUINAL HERNIA REPAIR WITH MESH (Left) HERNIA REPAIR UMBILICAL ADULT WITH MESH     Patient location during evaluation: PACU Anesthesia Type: General Level of consciousness: sedated and patient cooperative Pain management: pain level controlled Vital Signs Assessment: post-procedure vital signs reviewed and stable Respiratory status: spontaneous breathing Cardiovascular status: stable Anesthetic complications: no   No notable events documented.  Last Vitals:  Vitals:   03/23/23 1232 03/23/23 1453  BP: (!) 154/65 (!) 156/75  Pulse: 67 67  Resp: 18 16  Temp: 36.7 C 36.7 C  SpO2: 97% 96%    Last Pain:  Vitals:   03/23/23 1646  TempSrc:   PainSc: 8                  Lewie Loron

## 2023-03-23 NOTE — Interval H&P Note (Signed)
History and Physical Interval Note:  03/23/2023 8:53 AM  [name]  has presented today for surgery, with the diagnosis of LEFT INGUINAL HERNIA UMBILICAL HERNIA.  The various methods of treatment have been discussed with the patient and family. After consideration of risks, benefits and other options for treatment, the patient has consented to  Procedure(s): LEFT INGUINAL HERNIA REPAIR WITH MESH (Left) HERNIA REPAIR UMBILICAL ADULT WITH MESH (N/A) as a surgical intervention.  The patient's history has been reviewed, patient examined, no change in status, stable for surgery.  I have reviewed the patient's chart and labs.  Questions were answered to the patient's satisfaction.     [name]

## 2023-03-23 NOTE — Transfer of Care (Signed)
Immediate Anesthesia Transfer of Care Note  Patient: [name]  Procedure(s) Performed: LEFT INGUINAL HERNIA REPAIR WITH MESH (Left) HERNIA REPAIR UMBILICAL ADULT WITH MESH  Patient Location: PACU  Anesthesia Type:General  Level of Consciousness: drowsy and patient cooperative  Airway & Oxygen Therapy: Patient Spontanous Breathing and Patient connected to face mask oxygen  Post-op Assessment: Report given to RN and Post -op Vital signs reviewed and stable  Post vital signs: Reviewed and stable  Last Vitals:  Vitals Value Taken Time  BP 182/73 03/23/23 1100  Temp    Pulse 59 03/23/23 1101  Resp 13 03/23/23 1101  SpO2 100 % 03/23/23 1101  Vitals shown include unfiled device data.  Last Pain:  Vitals:   03/23/23 0751  TempSrc:   PainSc: 0-No pain      Patients Stated Pain Goal: 3 (03/23/23 0751)  Complications: No notable events documented.

## 2023-03-23 NOTE — Op Note (Addendum)
Left inguinal hernia repair with mesh/ umbilical hernia repair with mesh Procedure Note  Indications: This is an 89 year old male referred by PCP who presents with an enlarging mass in his left groin.  This resolves when he is supine.  He denies any obstructive symptoms.  He does report some discomfort associated with this.  He is referred to [place] for further evaluation.   The patient had a CT of the pelvis without contrast in January of this year that did not show any sign of hernia.  He had a CT of the abdomen and pelvis with contrast in October 2023 that also did not show any sign of inguinal hernia.  He has had previous abdominal surgery and has an obvious hernia at his umbilicus in his old incision.   The patient has multiple comorbidities including permanent pacemaker, history of SVT, first-degree AV block, aortic atherosclerosis.  He is not on any blood thinners.  He lives independently with his wife.  He still drives.  He states that he is fairly active.  He has significant issues with constipation that seems to be worse with the enlarging hernia.  He reports minimal discomfort at his umbilicus.  He has been cleared by cardiology and presents now for hernia repairs.   Pre-operative Diagnosis: left reducible inguinal hernia/ umbilical incisional hernia with 2 cm fascial defect Post-operative Diagnosis: same  Surgeon: [name]   Assistants: none  Anesthesia: General endotracheal anesthesia TAP block  ASA Class: 3  Procedure Details  The patient was seen again in the Holding Room. The risks, benefits, complications, treatment options, and expected outcomes were discussed with the patient. The possibilities of reaction to medication, pulmonary aspiration, perforation of viscus, bleeding, recurrent infection, the need for additional procedures, and development of a complication requiring transfusion or further operation were discussed with the patient and/or family. The likelihood of  success in repairing the hernia and returning the patient to their previous functional status is good.  There was concurrence with the proposed plan, and informed consent was obtained. The site of surgery was properly noted/marked. The patient was taken to the Operating Room, identified as [name], and the procedure verified as left inguinal hernia repair/ umbilical hernia repair. A Time Out was held and the above information confirmed.  The patient was placed in the supine position and underwent induction of anesthesia. The  abdomen and groin was prepped with Chloraprep and draped in the standard fashion.   After an adequate level of general anesthesia was obtained, the patient's abdomen was prepped with Chloraprep and draped in sterile fashion.  We made an incision around the umbilicus through his old scar. Dissection was carried down to the hernia sac with cautery.  We dissected bluntly around the hernia sac down to the edge of the fascial defect.  We reduced the hernia sac back into the pre-peritoneal space.  The fascial defect measured 2 cm.  We cleared the fascia in all directions.  A small Ventralex mesh was inserted into the pre-peritoneal space and was deployed.  The mesh was secured with four trans-fascial sutures of 0 Novofil.  The fascial defect was closed with multiple interrupted figure-of-eight 0 Novofil sutures.  The base of the umbilicus was tacked down with 3-0 Vicryl.  3-0 Vicryl was used to close the subcutaneous tissues and 4-0 Monocryl was used to close the skin.   We turned our attention to the left groin.  An oblique incision was made over the left inguinal canal. Dissection  was carried down through the subcutaneous tissue with cautery to the external oblique fascia.  We opened the external oblique fascia along the direction of its fibers to the external ring.  The spermatic cord was circumferentially dissected bluntly and retracted with a Penrose drain. The floor of the  inguinal canal was inspected and was intact.  We skeletonized the spermatic cord and reduced a moderate-sized indirect hernia sac and cord lipoma.  The internal ring was tightened with 0 Vicryl.We used a left Progrip mesh which was inserted and deployed across the floor of the inguinal canal. The mesh was tucked underneath the external oblique fascia laterally.  The flap of the mesh was closed around the spermatic cord to recreate the internal inguinal ring.  The mesh was secured to the pubic tubercle with 0 Vicryl.  Additional stay sutures were placed in the pubic tubercle and the lower edge of the mesh to the shelving edge. The external oblique fascia was reapproximated with 2-0 Vicryl.  3-0 Vicryl was used to close the subcutaneous tissues and 4-0 Monocryl was used to close the skin in subcuticular fashion.  Benzoin and steri-strips were used to seal the incisions. Clean dressings were applied.  The patient was then extubated and brought to the recovery room in stable condition.  All sponge, instrument, and needle counts were correct prior to closure and at the conclusion of the case.       Estimated Blood Loss: Minimal                 Complications: None; patient tolerated the procedure well.         Disposition: PACU - hemodynamically stable.         Condition: stable  [name]. Joseph Skains, MD, [facility] Surgery  General Surgery   03/23/2023 11:07 AM

## 2023-03-23 NOTE — Anesthesia Procedure Notes (Signed)
Procedure Name: Intubation Date/Time: 03/23/2023 9:32 AM  Performed by: Ahmed Prima, CRNAPre-anesthesia Checklist: Patient identified, Emergency Drugs available, Suction available and Patient being monitored Patient Re-evaluated:Patient Re-evaluated prior to induction Oxygen Delivery Method: Circle system utilized Preoxygenation: Pre-oxygenation with 100% oxygen Induction Type: IV induction Ventilation: Mask ventilation without difficulty Laryngoscope Size: Miller and 2 Grade View: Grade II Tube type: Oral Tube size: 7.5 mm Number of attempts: 1 Airway Equipment and Method: Stylet and Oral airway Placement Confirmation: ETT inserted through vocal cords under direct vision, positive ETCO2 and breath sounds checked- equal and bilateral Secured at: 23 cm Tube secured with: Tape Dental Injury: Teeth and Oropharynx as per pre-operative assessment

## 2023-03-23 NOTE — Progress Notes (Signed)
The fentanyl taken out of the Pyxis by Richardean Sale, RN used in pre-op for the TAP block was incorrectly wasted under the fentanyl taken out for the OR. of fentanyl was given by Richardean Sale, RN to the patient for the TAP block. of fentanyl was given by Rutha Bouchard, CRNA to the patient intra-op. was wasted with Richardean Sale, RN in preop.

## 2023-03-23 NOTE — Anesthesia Procedure Notes (Signed)
Anesthesia Regional Block: TAP block   Pre-Anesthetic Checklist: , timeout performed,  Correct Patient, Correct Site, Correct Laterality,  Correct Procedure, Correct Position, site marked,  Risks and benefits discussed,  Surgical consent,  Pre-op evaluation,  At surgeon's request and post-op pain management  Laterality: Left  Prep: chloraprep       Needles:  Injection technique: Single-shot  Needle Type: Echogenic Stimulator Needle      Needle Gauge: 21     Additional Needles:   Procedures:,,,, ultrasound used (permanent image in chart),,    Narrative:  Start time: 03/23/2023 8:28 AM End time: 03/23/2023 8:48 AM Injection made incrementally with aspirations every 5 mL.  Performed by: Personally  Anesthesiologist: Lewie Loron, MD  Additional Notes: BP cuff, SpO2 and EKG monitors applied. Sedation begun.  Anesthetic injected incrementally, slowly, and after neg aspirations under direct ultrasound guidance. Good fascial spread noted. Patient tolerated well.

## 2023-03-23 NOTE — Progress Notes (Signed)
Agree with Below note from Rutha Bouchard, CRNA. Waste witnessed.   The fentanyl taken out of the Pyxis by Richardean Sale, RN used in pre-op for the TAP block was incorrectly wasted under the fentanyl taken out for the OR. of fentanyl was given by Richardean Sale, RN to the patient for the TAP block. of fentanyl was given by Rutha Bouchard, CRNA to the patient intra-op. was wasted with Richardean Sale, RN in preop.

## 2023-03-24 ENCOUNTER — Encounter (HOSPITAL_COMMUNITY): Payer: Self-pay | Admitting: Surgery

## 2023-03-24 DIAGNOSIS — Z79899 Other long term (current) drug therapy: Secondary | ICD-10-CM | POA: Diagnosis not present

## 2023-03-24 DIAGNOSIS — Z87891 Personal history of nicotine dependence: Secondary | ICD-10-CM | POA: Diagnosis not present

## 2023-03-24 DIAGNOSIS — Z950 Presence of cardiac pacemaker: Secondary | ICD-10-CM | POA: Diagnosis not present

## 2023-03-24 DIAGNOSIS — K429 Umbilical hernia without obstruction or gangrene: Secondary | ICD-10-CM | POA: Diagnosis not present

## 2023-03-24 DIAGNOSIS — K4090 Unilateral inguinal hernia, without obstruction or gangrene, not specified as recurrent: Secondary | ICD-10-CM | POA: Diagnosis not present

## 2023-03-24 DIAGNOSIS — K432 Incisional hernia without obstruction or gangrene: Secondary | ICD-10-CM | POA: Diagnosis not present

## 2023-03-24 DIAGNOSIS — I129 Hypertensive chronic kidney disease with stage 1 through stage 4 chronic kidney disease, or unspecified chronic kidney disease: Secondary | ICD-10-CM | POA: Diagnosis not present

## 2023-03-24 DIAGNOSIS — N1831 Chronic kidney disease, stage 3a: Secondary | ICD-10-CM | POA: Diagnosis not present

## 2023-03-24 MED ORDER — TRAMADOL HCL 50 MG PO TABS: 50.0000 mg | ORAL_TABLET | Freq: Four times a day (QID) | ORAL | 0 refills | Status: DC | PRN

## 2023-03-24 NOTE — Discharge Summary (Signed)
Physician Discharge Summary  Patient ID: [name] MRN: [id number] DOB/AGE: Jul 16, 1932 89 y.o.  Admit date: 03/23/2023 Discharge date: 03/24/2023  Admission Diagnoses:  Left inguinal hernia/ umbilical incisional hernia  Discharge Diagnoses: same Principal Problem:   Left inguinal hernia   Discharged Condition: good  Hospital Course: Open repair of umbilical hernia with mesh/ left inguinal hernia repair with mesh on 8/29.  He was kept overnight due to his comorbidities.  No issues.  Good pain control.   Treatments: surgery: LIH/ umbilical hernia repairs with mesh  Discharge Exam: Blood pressure (!) 144/69, pulse 60, temperature 97.9 F (36.6 C), temperature source Oral, resp. rate 18, height 5\' 5"  (1.651 m), weight 62.1 kg, SpO2 92%. WDWN in NAD Abd - minimal tenderness; umbilical incision dressing dry Left groin - minimal tenderness and swelling; some dried drainage on the dressing, but no sign of active oozing.  Disposition: Discharge disposition: 01-Home or Self Care       Discharge Instructions     Call MD for:  persistant nausea and vomiting   Complete by: As directed    Call MD for:  redness, tenderness, or signs of infection (pain, swelling, redness, odor or green/yellow discharge around incision site)   Complete by: As directed    Call MD for:  severe uncontrolled pain   Complete by: As directed    Call MD for:  temperature >100.4   Complete by: As directed    Diet general   Complete by: As directed    Driving Restrictions   Complete by: As directed    Do not drive while taking pain medications   Increase activity slowly   Complete by: As directed    May shower / Bathe   Complete by: As directed       Allergies as of 03/24/2023       Reactions   Codeine Nausea And Vomiting        Medication List     TAKE these medications    acetaminophen 500 MG tablet Commonly known as: TYLENOL Take 500-1,000 mg by mouth every 6 (six) hours as needed  for moderate pain or mild pain.   alclomethasone 0.05 % cream Commonly known as: ACLOVATE APPLY TWICE DAILY   amLODipine 5 MG tablet Commonly known as: NORVASC Take 1 tablet (5 mg total) by mouth daily.   cholecalciferol 25 MCG (1000 UNIT) tablet Commonly known as: VITAMIN D3 Take 1,000 Units by mouth daily.   hydrochlorothiazide 12.5 MG tablet Commonly known as: HYDRODIURIL Take 1 tablet (12.5 mg total) by mouth daily.   loratadine 10 MG tablet Commonly known as: CLARITIN Take 10 mg by mouth daily.   omeprazole 20 MG capsule Commonly known as: PRILOSEC TAKE 1 CAPSULE BY MOUTH  TWICE DAILY BEFORE MEALS   rifaximin 550 MG Tabs tablet Commonly known as: XIFAXAN Take 1 tablet (550 mg total) by mouth 3 (three) times daily.   rosuvastatin 20 MG tablet Commonly known as: CRESTOR TAKE 1 TABLET BY MOUTH DAILY   traMADol 50 MG tablet Commonly known as: ULTRAM Take 1 tablet (50 mg total) by mouth every 6 (six) hours as needed (mild pain).   triamcinolone cream 0.1 % Commonly known as: KENALOG APPLY TOPICALLY TO AFFECTED  AREA(S) TWICE DAILY        Follow-up Information     Manus Rudd, MD Follow up in 5 week(s).   Specialty: General Surgery Contact information: [address] [place] [address] [phone]  Signed: [name] 03/24/2023, 8:32 AM

## 2023-03-24 NOTE — Progress Notes (Signed)
AVS reviewed w/ pt  & daughter - both verbalized an understanding - PIV removed as documented - Pt's daughter went to get car- pt to home via POV w/ daughter

## 2023-03-24 NOTE — Discharge Instructions (Signed)
[facility] _______[facility]  UMBILICAL OR INGUINAL HERNIA REPAIR: POST OP INSTRUCTIONS  Always review your discharge instruction sheet given to you by the facility where your surgery was performed. IF YOU HAVE DISABILITY OR FAMILY LEAVE FORMS, YOU MUST BRING THEM TO THE OFFICE FOR PROCESSING.   DO NOT GIVE THEM TO YOUR DOCTOR.  1. A  prescription for pain medication may be given to you upon discharge.  Take your pain medication as prescribed, if needed.  If narcotic pain medicine is not needed, then you may take acetaminophen (Tylenol) or ibuprofen (Advil) as needed. 2. Take your usually prescribed medications unless otherwise directed. If you need a refill on your pain medication, please contact your pharmacy.  They will contact our office to request authorization. Prescriptions will not be filled after 5 pm or on week-ends. 3. You should follow a light diet the first 24 hours after arrival home, such as soup and crackers, etc.  Be sure to include lots of fluids daily.  Resume your normal diet the day after surgery. 4.Most patients will experience some swelling and bruising around the umbilicus or in the groin and scrotum.  Ice packs and reclining will help.  Swelling and bruising can take several days to resolve.  6. It is common to experience some constipation if taking pain medication after surgery.  Increasing fluid intake and taking a stool softener (such as Colace) will usually help or prevent this problem from occurring.  A mild laxative (Milk of Magnesia or Miralax) should be taken according to package directions if there are no bowel movements after 48 hours. 7. Unless discharge instructions indicate otherwise, you may remove your bandages 24-48 hours after surgery, and you may shower at that time.  You may have steri-strips (small skin tapes) in place directly over the incision.  These strips should be left on the skin for 7-10 days.   8. ACTIVITIES:  You may resume regular  (light) daily activities beginning the next day--such as daily self-care, walking, climbing stairs--gradually increasing activities as tolerated.  You may have sexual intercourse when it is comfortable.  Refrain from any heavy lifting or straining until approved by your doctor.  a.You may drive when you are no longer taking prescription pain medication, you can comfortably wear a seatbelt, and you can safely maneuver your car and apply brakes. b.RETURN TO WORK:   _____________________________________________  9.You should see your doctor in the office for a follow-up appointment approximately 4-5 weeks after your surgery.  Make sure that you call for this appointment within a day or two after you arrive home to insure a convenient appointment time. 10.OTHER INSTRUCTIONS: _________________________    _____________________________________  WHEN TO CALL YOUR DOCTOR: Fever over 101.0 Inability to urinate Nausea and/or vomiting Extreme swelling or bruising Continued bleeding from incision. Increased pain, redness, or drainage from the incision  The clinic staff is available to answer your questions during regular business hours.  Please don't hesitate to call and ask to speak to one of the nurses for clinical concerns.  If you have a medical emergency, go to the nearest emergency room or call 911.  A surgeon from [facility] is always on call at the hospital   [address], [place], [place]  [address] ?  [address] [address], [place], [place]   [address] [phone] ? [phone] ? FAX [phone] Web site: [email or web address]

## 2023-03-28 ENCOUNTER — Ambulatory Visit: Payer: Medicare Other | Admitting: Physician Assistant

## 2023-04-19 ENCOUNTER — Ambulatory Visit: Payer: Medicare Other

## 2023-04-19 DIAGNOSIS — I442 Atrioventricular block, complete: Secondary | ICD-10-CM | POA: Diagnosis not present

## 2023-04-19 LAB — CUP PACEART REMOTE DEVICE CHECK
Battery Remaining Longevity: 39 mo
Battery Voltage: 2.94 V
Brady Statistic AP VP Percent: 68.8 %
Brady Statistic AP VS Percent: 0.06 %
Brady Statistic AS VP Percent: 30.7 %
Brady Statistic AS VS Percent: 0.44 %
Brady Statistic RA Percent Paced: 68.14 %
Brady Statistic RV Percent Paced: 99.5 %
Date Time Interrogation Session: 20240924192037
Implantable Lead Connection Status: 753985
Implantable Lead Connection Status: 753985
Implantable Lead Implant Date: 20180620
Implantable Lead Implant Date: 20180620
Implantable Lead Location: 753859
Implantable Lead Location: 753860
Implantable Lead Model: 5076
Implantable Lead Model: 5076
Implantable Pulse Generator Implant Date: 20180620
Lead Channel Impedance Value: 285 Ohm
Lead Channel Impedance Value: 342 Ohm
Lead Channel Impedance Value: 361 Ohm
Lead Channel Impedance Value: 380 Ohm
Lead Channel Pacing Threshold Amplitude: 0.5 V
Lead Channel Pacing Threshold Amplitude: 0.75 V
Lead Channel Pacing Threshold Pulse Width: 0.4 ms
Lead Channel Pacing Threshold Pulse Width: 0.4 ms
Lead Channel Sensing Intrinsic Amplitude: 3 mV
Lead Channel Sensing Intrinsic Amplitude: 3 mV
Lead Channel Sensing Intrinsic Amplitude: 4.5 mV
Lead Channel Sensing Intrinsic Amplitude: 4.5 mV
Lead Channel Setting Pacing Amplitude: 1.5 V
Lead Channel Setting Pacing Amplitude: 2.5 V
Lead Channel Setting Pacing Pulse Width: 0.4 ms
Lead Channel Setting Sensing Sensitivity: 2 mV
Zone Setting Status: 755011
Zone Setting Status: 755011

## 2023-04-20 ENCOUNTER — Ambulatory Visit (INDEPENDENT_AMBULATORY_CARE_PROVIDER_SITE_OTHER): Payer: Medicare Other | Admitting: Medical

## 2023-04-20 VITALS — Wt 141.8 lb

## 2023-04-20 DIAGNOSIS — E876 Hypokalemia: Secondary | ICD-10-CM | POA: Diagnosis not present

## 2023-04-20 DIAGNOSIS — H6122 Impacted cerumen, left ear: Secondary | ICD-10-CM | POA: Diagnosis not present

## 2023-04-20 DIAGNOSIS — H6123 Impacted cerumen, bilateral: Secondary | ICD-10-CM

## 2023-04-20 DIAGNOSIS — Z23 Encounter for immunization: Secondary | ICD-10-CM | POA: Diagnosis not present

## 2023-04-20 DIAGNOSIS — E559 Vitamin D deficiency, unspecified: Secondary | ICD-10-CM

## 2023-04-20 DIAGNOSIS — H9192 Unspecified hearing loss, left ear: Secondary | ICD-10-CM

## 2023-04-20 NOTE — Progress Notes (Signed)
Subjective:   Here for decreased hearing, possible wax buildup projecting in the left ear.  He has to get earwax cleaned out from time to time.  He would like a COVID booster today.  He has been using vitamin D 1000 units daily.  Here for recheck labs on how well this is working since he has been on it for several months.  He recently had hernia surgery end of August, inguinal and umbilical   Review of Systems Constitutional: denies fever, chills, sweats ENT: no runny nose, ear pain, sore throat, hoarseness, sinus pain, teeth pain, tinnitus, hearing loss Gastroenterology: denies nausea, vomiting     Objective:   Physical Exam  General appearance: alert, no distress, WD/WN Ears: Bilateral impacted cerumen     Assessment & Plan:    Encounter Diagnoses  Name Primary?   Vitamin D deficiency Yes   Low blood potassium    COVID-19 vaccine administered    Decreased hearing of left ear    Bilateral impacted cerumen     Vitamin D deficiency-updated labs today since he has been using vitamin D 1000 units daily for the last several months.  On his last labs a few months ago his potassium was a little low.  Recheck labs today.  He is on diuretic.  Discussed findings.  Discussed risk/benefits of procedure and patient agrees to procedure. Successfully used warm water lavage to remove impacted cerumen from bilateral ear canal. Patient tolerated procedure well. Advised they avoid using any cotton swabs or other devices to clean the ear canals.  Use basic hygiene as discussed.  Follow up prn.   Counseled on the Covid virus vaccine.  Vaccine information sheet given.  Covid vaccine given after consent obtained.  [name] was seen today for ear cleaned.  Diagnoses and all orders for this visit:  Vitamin D deficiency -     Basic metabolic panel -     VITAMIN D 25 Hydroxy (Vit-D Deficiency, Fractures)  Low blood potassium -     Basic metabolic panel  COVID-19 vaccine administered -      Pfizer Comirnaty Covid -19 Vaccine 69yrs and older  Decreased hearing of left ear  Bilateral impacted cerumen   F/u pending labs

## 2023-04-21 ENCOUNTER — Other Ambulatory Visit: Payer: Self-pay | Admitting: Medical

## 2023-04-21 ENCOUNTER — Ambulatory Visit: Payer: Medicare Other | Admitting: Internal Medicine

## 2023-04-21 LAB — BASIC METABOLIC PANEL
BUN/Creatinine Ratio: 15 (ref 10–24)
BUN: 13 mg/dL (ref 8–27)
CO2: 24 mmol/L (ref 20–29)
Calcium: 10 mg/dL (ref 8.6–10.2)
Chloride: 98 mmol/L (ref 96–106)
Creatinine, Ser: 0.89 mg/dL (ref 0.76–1.27)
Glucose: 81 mg/dL (ref 70–99)
Potassium: 4.1 mmol/L (ref 3.5–5.2)
Sodium: 138 mmol/L (ref 134–144)
eGFR: 82 mL/min/{1.73_m2} (ref >59)

## 2023-04-21 LAB — VITAMIN D 25 HYDROXY (VIT D DEFICIENCY, FRACTURES): Vit D, 25-Hydroxy: 35.8 ng/mL (ref 30.0–100.0)

## 2023-04-21 MED ORDER — VITAMIN D3 25 MCG (1000 UNIT) PO TABS: 1000.0000 [IU] | ORAL_TABLET | Freq: Every day | ORAL | 3 refills | Status: AC

## 2023-04-21 NOTE — Progress Notes (Signed)
Results sent through MyChart

## 2023-04-27 ENCOUNTER — Encounter: Payer: Self-pay | Admitting: Podiatry

## 2023-04-27 ENCOUNTER — Ambulatory Visit: Payer: Medicare Other | Admitting: Podiatry

## 2023-04-27 ENCOUNTER — Telehealth: Payer: Self-pay

## 2023-04-27 DIAGNOSIS — L600 Ingrowing nail: Secondary | ICD-10-CM

## 2023-04-27 NOTE — Telephone Encounter (Signed)
Xifaxan samples left for patient on 01-17-23 were never picked up. Returned to stock 04-27-23

## 2023-04-27 NOTE — Progress Notes (Signed)
Subjective:   Patient ID: [name], male   DOB: 89 y.o.   MRN: [id number]   HPI Patient presents with caregiver with a lot of pain in the medial border of the left big toenail and states he has been using Neosporin   ROS      Objective:  Physical Exam  Neuro vascular status intact incurvated medial border left big toe sore when pressed no active drainage redness noted     Assessment:  Ingrown toenail deformity left hallux medial border     Plan:  Reviewed condition recommended temporary removal of the nailbed anesthetized 60 mg like Marcaine mixture sterile prep done using sterile instrumentation remove the medial border cleaned it out applied sterile dressing instructed on soaks reappoint as symptoms indicate

## 2023-05-04 ENCOUNTER — Ambulatory Visit: Payer: Medicare Other | Admitting: Orthopaedic Surgery

## 2023-05-04 ENCOUNTER — Encounter: Payer: Self-pay | Admitting: Orthopaedic Surgery

## 2023-05-04 DIAGNOSIS — M1712 Unilateral primary osteoarthritis, left knee: Secondary | ICD-10-CM

## 2023-05-04 DIAGNOSIS — M1711 Unilateral primary osteoarthritis, right knee: Secondary | ICD-10-CM

## 2023-05-04 DIAGNOSIS — M170 Bilateral primary osteoarthritis of knee: Secondary | ICD-10-CM | POA: Diagnosis not present

## 2023-05-04 MED ORDER — LIDOCAINE HCL 1 % IJ SOLN
2.0000 mL | INTRAMUSCULAR | Status: AC | PRN
Start: 2023-05-04 — End: 2023-05-04
  Administered 2023-05-04: 2 mL

## 2023-05-04 MED ORDER — METHYLPREDNISOLONE ACETATE 40 MG/ML IJ SUSP
40.0000 mg | INTRAMUSCULAR | Status: AC | PRN
Start: 2023-05-04 — End: 2023-05-04
  Administered 2023-05-04: 40 mg via INTRA_ARTICULAR

## 2023-05-04 MED ORDER — BUPIVACAINE HCL 0.5 % IJ SOLN
2.0000 mL | INTRAMUSCULAR | Status: AC | PRN
Start: 2023-05-04 — End: 2023-05-04
  Administered 2023-05-04: 2 mL via INTRA_ARTICULAR

## 2023-05-04 NOTE — Progress Notes (Signed)
Office Visit Note   Patient: [name]           Date of Birth: 1932/02/22           MRN: [id number] Visit Date: 05/04/2023              Requested by: Ronnald Nian, MD [id number] [place],  [place] [address] PCP: Ronnald Nian, MD   Assessment & Plan: Visit Diagnoses:  1. Primary osteoarthritis of right knee   2. Primary osteoarthritis of left knee     Plan: Tyhir is a 89 year old gentleman with bilateral knee DJD.  Cortisone injections repeated today.  He tolerated this well.  Follow-up as needed.  Follow-Up Instructions: No follow-ups on file.   Orders:  No orders of the defined types were placed in this encounter.  No orders of the defined types were placed in this encounter.     Procedures: Large Joint Inj: bilateral knee on 05/04/2023 11:00 AM Indications: pain Details: 22 G needle  Arthrogram: No  Medications (Right): 2 mL lidocaine 1 %; 2 mL bupivacaine 0.5 %; 40 mg methylPREDNISolone acetate 40 MG/ML Medications (Left): 2 mL lidocaine 1 %; 2 mL bupivacaine 0.5 %; 40 mg methylPREDNISolone acetate 40 MG/ML Outcome: tolerated well, no immediate complications Patient was prepped and draped in the usual sterile fashion.       Clinical Data: No additional findings.   Subjective: Chief Complaint  Patient presents with   Right Knee - Pain   Left Knee - Pain    HPI Mr. Ugalde comes in today for bilateral knee pain due to advanced DJD.  He requesting another round of injections.  He has last injections were 10 months ago. Review of Systems   Objective: Vital Signs: There were no vitals taken for this visit.  Physical Exam  Ortho Exam Exam of bilateral knees is unchanged. Specialty Comments:  EXAM: CT LUMBAR SPINE WITHOUT CONTRAST   TECHNIQUE: Multidetector CT imaging of the lumbar spine was performed without intravenous contrast administration. Multiplanar CT image reconstructions were also generated.   COMPARISON:   Lumbar spine radiographs 02/01/2018   FINDINGS: Segmentation: Normal   Alignment: Mild retrolisthesis L1-2, L2-3, L3-4. Mild anterolisthesis L4-5 approximately 4 mm. 5 mm retrolisthesis L5-S1. Mild lumbar scoliosis.   Vertebrae: Negative for fracture or mass   Paraspinal and other soft tissues: Heavily calcified aorta and iliac arteries without aneurysm. No retroperitoneal adenopathy or mass   Disc levels: T11-12: Bilateral facet degeneration. Negative for stenosis   L1-2: Advanced disc degeneration with disc space narrowing and mild spurring. Mild facet degeneration. Moderate right foraminal stenosis due to spurring   L1-2: Advanced disc degeneration with disc space narrowing. Diffuse disc bulging. Moderate right foraminal encroachment due to vertebral endplate spurring and facet hypertrophy.   L2-3: Advanced disc degeneration with fusion of the disc space on the right. Mild foraminal stenosis bilaterally.   L3-4: Disc degeneration and spondylosis. Bilateral facet hypertrophy. Severe left foraminal encroachment and mild right foraminal encroachment due to spurring   L4-5: 4 mm anterolisthesis with advanced disc degeneration. Advanced facet hypertrophy bilaterally. Moderate central canal stenosis. Severe subarticular stenosis bilaterally with severe left foraminal encroachment.   L5-S1: 5 mm retrolisthesis with advanced disc degeneration. Diffuse endplate spurring and mild facet hypertrophy. Left-sided facet degeneration and spurring projecting into the spinal canal and likely affecting the left L5 nerve root. Moderate subarticular stenosis and foraminal stenosis bilaterally due to spurring   IMPRESSION: Advanced multilevel degenerative change throughout the lumbar  spine with extensive disc space narrowing and spurring. Multilevel foraminal encroachment due to spurring as described above. Moderate central canal stenosis at L4-5.   Atherosclerotic aorta      Electronically Signed   By: Marlan Palau M.D.   On: 02/19/2018 13:32  Imaging: No results found.   PMFS History: Patient Active Problem List   Diagnosis Date Noted   Left inguinal hernia 03/23/2023   Aortic atherosclerosis (HCC) 05/03/2022   Parkinsonism (HCC) 02/07/2022   Retinopathy  11/05/2020   Senile purpura (HCC) 04/30/2020   Stage 3a chronic kidney disease (HCC) 04/30/2020   Irritable bowel syndrome with constipation 04/30/2020   Primary osteoarthritis of left knee 10/03/2019   Primary osteoarthritis of right knee 10/03/2019   Gastroesophageal reflux disease 04/29/2019   Pacemaker 04/21/2017   Complete heart block (HCC) 01/11/2017   History of prostate cancer 12/27/2010   Arthritis 12/27/2010   History of cataract extraction 12/27/2010   HH (hiatus hernia)    History of colonic polyps    Hyperlipidemia LDL goal <130 04/21/2008   Allergic rhinitis 04/21/2008   Essential hypertension 04/18/2008   Past Medical History:  Diagnosis Date   1st degree AV block    Complete Heart block   Aortic atherosclerosis (HCC) 05/03/2022   Arthritis    "hands, back, neck, shoulders, knees" (01/11/2017)   Eczema    GERD (gastroesophageal reflux disease)    HH (hiatus hernia)    History of blood transfusion    "maybe; w/prostate OR; they would have used my own" (01/11/2017)   History of colonic polyps    "I don't think I've had any polyps" (01/11/2017)   Hypercholesterolemia    Hypertension    Presence of permanent cardiac pacemaker    Prostate cancer (HCC) 1997   Seasonal allergies    SVT (supraventricular tachycardia) (HCC)     Family History  Problem Relation Age of Onset   Cancer Mother    Alcoholism Father     Past Surgical History:  Procedure Laterality Date   AV NODE ABLATION  2000s X 2   AVNODE REENTRANT ABLATION   CATARACT EXTRACTION W/ INTRAOCULAR LENS  IMPLANT, BILATERAL     COLONOSCOPY  2007   MAGOD   INGUINAL HERNIA REPAIR Left 03/23/2023   Procedure:  LEFT INGUINAL HERNIA REPAIR WITH MESH;  Surgeon: Manus Rudd, MD;  Location: [facility];  Service: General;  Laterality: Left;   INSERT / REPLACE / REMOVE PACEMAKER  01/11/2017   PACEMAKER IMPLANT N/A 01/11/2017   Procedure: Pacemaker Implant;  Surgeon: Marinus Maw, MD;  [facility]: [facility];  Service: Cardiovascular;  Laterality: N/A;   PROSTATECTOMY  1997   SKIN BIOPSY Right 02/04/2019   verruca vulgaris, irritated   TESTICLE SURGERY Left 2010   "benign growth"; [facility]   TONSILLECTOMY     UMBILICAL HERNIA REPAIR N/A 03/23/2023   Procedure: HERNIA REPAIR UMBILICAL ADULT WITH MESH;  Surgeon: Manus Rudd, MD;  Location: [facility];  Service: General;  Laterality: N/A;   Social History   Occupational History   Occupation: retired  Tobacco Use   Smoking status: Former    Average packs/day: 1.5 packs/day for 13.0 years (19.5 ttl pk-yrs)    Types: Cigarettes    Start date: 08/25/1962    Quit date: 08/25/1961    Years since quitting: 61.7   Smokeless tobacco: Never  Vaping Use   Vaping status: Never Used  Substance and Sexual Activity   Alcohol use: No   Drug use: No  Sexual activity: Not Currently

## 2023-05-05 NOTE — Progress Notes (Signed)
Remote pacemaker transmission.   

## 2023-05-15 ENCOUNTER — Other Ambulatory Visit: Payer: Self-pay | Admitting: Family Medicine

## 2023-05-15 DIAGNOSIS — I700 Atherosclerosis of aorta: Secondary | ICD-10-CM

## 2023-05-15 NOTE — Telephone Encounter (Signed)
Pt called back and says he does not need a refill at this time

## 2023-05-15 NOTE — Telephone Encounter (Signed)
Called to see if he needed

## 2023-05-16 ENCOUNTER — Other Ambulatory Visit: Payer: Self-pay | Admitting: Family Medicine

## 2023-05-16 DIAGNOSIS — I10 Essential (primary) hypertension: Secondary | ICD-10-CM

## 2023-05-23 ENCOUNTER — Ambulatory Visit: Payer: Medicare Other

## 2023-05-23 DIAGNOSIS — Z0000 Encounter for general adult medical examination without abnormal findings: Secondary | ICD-10-CM

## 2023-05-23 NOTE — Progress Notes (Signed)
Subjective:   [name] is a 89 y.o. male who presents for Medicare Annual/Subsequent preventive examination.  Visit Complete: Virtual I connected with  [name] on 05/23/23 by a audio enabled telemedicine application and verified that I am speaking with the correct person using two identifiers.  Patient Location: Home  Provider Location: Office/Clinic  I discussed the limitations of evaluation and management by telemedicine. The patient expressed understanding and agreed to proceed.  Vital Signs: Because this visit was a virtual/telehealth visit, some criteria may be missing or patient reported. Any vitals not documented were not able to be obtained and vitals that have been documented are patient reported.    Cardiac Risk Factors include: advanced age (>73men, >5 women);dyslipidemia;hypertension;male gender     Objective:    Today's Vitals   There is no height or weight on file to calculate BMI.     05/23/2023    4:08 PM 03/24/2023    9:13 AM 03/23/2023   12:43 PM 03/22/2023    8:38 AM 08/04/2022   10:01 AM 05/10/2022    9:33 AM 05/03/2021    9:40 AM  Advanced Directives  Does Patient Have a Medical Advance Directive? Yes Yes Yes Yes Yes Yes Yes  Type of Estate agent of [place];Living will Living will Living will Healthcare Power of [place];Living will  Healthcare Power of [place];Living will;Out of facility DNR (pink MOST or yellow form) Living will;Healthcare Power of [place];Out of facility DNR (pink MOST or yellow form)  Does patient want to make changes to medical advance directive?  No - Patient declined No - Patient declined No - Patient declined  No - Patient declined No - Patient declined  Copy of Healthcare Power of Attorney in Chart? Yes - validated most recent copy scanned in chart (See row information) No - copy requested  No - copy requested  Yes - validated most recent copy scanned in chart (See row information) Yes -  validated most recent copy scanned in chart (See row information)    Current Medications (verified) Outpatient Encounter Medications as of 05/23/2023  Medication Sig   acetaminophen (TYLENOL) 500 MG tablet Take 500-1,000 mg by mouth every 6 (six) hours as needed for moderate pain or mild pain.   alclomethasone (ACLOVATE) 0.05 % cream APPLY TWICE DAILY   amLODipine (NORVASC) 5 MG tablet TAKE 1 TABLET BY MOUTH DAILY   cholecalciferol (VITAMIN D3) 25 MCG (1000 UNIT) tablet Take 1 tablet (1,000 Units total) by mouth daily.   hydrochlorothiazide (HYDRODIURIL) 12.5 MG tablet TAKE 1 TABLET BY MOUTH DAILY   loratadine (CLARITIN) 10 MG tablet Take 10 mg by mouth daily.   omeprazole (PRILOSEC) 20 MG capsule TAKE 1 CAPSULE BY MOUTH  TWICE DAILY BEFORE MEALS   polyethylene glycol (MIRALAX / GLYCOLAX) 17 g packet Take 17 g by mouth daily.   rosuvastatin (CRESTOR) 20 MG tablet TAKE 1 TABLET BY MOUTH DAILY   No facility-administered encounter medications on file as of 05/23/2023.    Allergies (verified) Codeine   History: Past Medical History:  Diagnosis Date   1st degree AV block    Complete Heart block   Aortic atherosclerosis (HCC) 05/03/2022   Arthritis    "hands, back, neck, shoulders, knees" (01/11/2017)   Eczema    GERD (gastroesophageal reflux disease)    HH (hiatus hernia)    History of blood transfusion    "maybe; w/prostate OR; they would have used my own" (01/11/2017)   History of colonic polyps    "  I don't think I've had any polyps" (01/11/2017)   Hypercholesterolemia    Hypertension    Presence of permanent cardiac pacemaker    Prostate cancer (HCC) 1997   Seasonal allergies    SVT (supraventricular tachycardia) (HCC)    Past Surgical History:  Procedure Laterality Date   AV NODE ABLATION  2000s X 2   AVNODE REENTRANT ABLATION   CATARACT EXTRACTION W/ INTRAOCULAR LENS  IMPLANT, BILATERAL     COLONOSCOPY  2007   MAGOD   INGUINAL HERNIA REPAIR Left 03/23/2023   Procedure:  LEFT INGUINAL HERNIA REPAIR WITH MESH;  Surgeon: Manus Rudd, MD;  Location: [facility];  Service: General;  Laterality: Left;   INSERT / REPLACE / REMOVE PACEMAKER  01/11/2017   PACEMAKER IMPLANT N/A 01/11/2017   Procedure: Pacemaker Implant;  Surgeon: Marinus Maw, MD;  [facility]: [facility];  Service: Cardiovascular;  Laterality: N/A;   PROSTATECTOMY  1997   SKIN BIOPSY Right 02/04/2019   verruca vulgaris, irritated   TESTICLE SURGERY Left 2010   "benign growth"; [facility]   TONSILLECTOMY     UMBILICAL HERNIA REPAIR N/A 03/23/2023   Procedure: HERNIA REPAIR UMBILICAL ADULT WITH MESH;  Surgeon: Manus Rudd, MD;  Location: [facility];  Service: General;  Laterality: N/A;   Family History  Problem Relation Age of Onset   Cancer Mother    Alcoholism Father    Social History   Socioeconomic History   Marital status: Married    Spouse name: Not on file   Number of children: Not on file   Years of education: Not on file   Highest education level: Not on file  Occupational History   Occupation: retired  Tobacco Use   Smoking status: Former    Average packs/day: 1.5 packs/day for 13.0 years (19.5 ttl pk-yrs)    Types: Cigarettes    Start date: 08/25/1962    Quit date: 08/25/1961    Years since quitting: 61.7   Smokeless tobacco: Never  Vaping Use   Vaping status: Never Used  Substance and Sexual Activity   Alcohol use: No   Drug use: No   Sexual activity: Not Currently  Other Topics Concern   Not on file  Social History Narrative   Originally from [place]   Social Determinants of Health   Financial Resource Strain: Low Risk  (05/23/2023)   Overall Financial Resource Strain (CARDIA)    Difficulty of Paying Living Expenses: Not hard at all  Food Insecurity: No Food Insecurity (05/23/2023)   Hunger Vital Sign    Worried About Running Out of Food in the Last Year: Never true    Ran Out of Food in the Last Year: Never true  Transportation Needs: No Transportation Needs  (05/23/2023)   PRAPARE - Administrator, Civil Service (Medical): No    Lack of Transportation (Non-Medical): No  Physical Activity: Sufficiently Active (05/23/2023)   Exercise Vital Sign    Days of Exercise per Week: 7 days    Minutes of Exercise per Session: 30 min  Stress: No Stress Concern Present (05/23/2023)   [employer] of Occupational Health - Occupational Stress Questionnaire    Feeling of Stress : Not at all  Social Connections: Moderately Isolated (05/23/2023)   Social Connection and Isolation Panel [NHANES]    Frequency of Communication with Friends and Family: More than three times a week    Frequency of Social Gatherings with Friends and Family: Twice a week    Attends Religious  Services: Never    Database administrator or Organizations: No    Attends Engineer, structural: Never    Marital Status: Married    Tobacco Counseling Counseling given: Not Answered   Clinical Intake:  Pre-visit preparation completed: Yes  Pain : No/denies pain     Nutritional Risks: None Diabetes: No  How often do you need to have someone help you when you read instructions, pamphlets, or other written materials from your doctor or pharmacy?: 1 - Never  Interpreter Needed?: No  Information entered by :: NAllen LPN   Activities of Daily Living    05/23/2023    4:01 PM 03/24/2023    9:13 AM  In your present state of health, do you have any difficulty performing the following activities:  Hearing? 1 1  Comment has hearing aids that he does not wear   Vision? 0 1  Difficulty concentrating or making decisions? 0 0  Walking or climbing stairs? 0 1  Dressing or bathing? 0 0  Doing errands, shopping? 0 0  Preparing Food and eating ? N   Using the Toilet? N   In the past six months, have you accidently leaked urine? N   Do you have problems with loss of bowel control? N   Managing your Medications? N   Managing your Finances? N   Housekeeping or  managing your Housekeeping? N     Patient Care Team: Ronnald Nian, MD as PCP - General (Family Medicine) Marinus Maw, MD as PCP - Electrophysiology (Cardiology) [facility], [facility]  Indicate any recent Medical Services you may have received from other than [facility] providers in the past year (date may be approximate).     Assessment:   This is a routine wellness examination for [place].  Hearing/Vision screen Hearing Screening - Comments:: Has hearing aids that he does not wear Vision Screening - Comments:: Regular eye exams, [facility]   Goals Addressed             This Visit's Progress    Patient Stated       05/23/2023, stay alive       Depression Screen    05/23/2023    4:12 PM 05/10/2022    9:30 AM 05/03/2021    9:41 AM 04/30/2020    9:29 AM 04/29/2019    9:13 AM 04/23/2018    9:05 AM 02/05/2018    9:09 AM  PHQ 2/9 Scores  PHQ - 2 Score 0 0 0 0 0 0 0  PHQ- 9 Score 0          Fall Risk    05/23/2023    4:10 PM 01/10/2023   10:24 AM 05/10/2022    9:30 AM 05/03/2021    9:41 AM 04/30/2020    9:29 AM  Fall Risk   Falls in the past year? 1 0 0 0 0  Comment tripped up the stairs      Number falls in past yr: 0 0 0 0   Injury with Fall? 0 0 0 0   Risk for fall due to : Medication side effect No Fall Risks No Fall Risks No Fall Risks   Follow up Falls prevention discussed;Falls evaluation completed Falls evaluation completed Falls evaluation completed Falls evaluation completed     MEDICARE RISK AT HOME: Medicare Risk at Home Any stairs in or around the home?: Yes If so, are there any without handrails?: No Home free of loose throw rugs in walkways,  pet beds, electrical cords, etc?: Yes Adequate lighting in your home to reduce risk of falls?: Yes Life alert?: No Use of a cane, walker or w/c?: No Grab bars in the bathroom?: Yes Shower chair or bench in shower?: Yes Elevated toilet seat or a handicapped toilet?: No  TIMED UP AND GO:  Was  the test performed?  No    Cognitive Function:        05/23/2023    4:13 PM  6CIT Screen  What Year? 0 points  What month? 0 points  What time? 0 points  Count back from 20 0 points  Months in reverse 0 points  Repeat phrase 0 points  Total Score 0 points    Immunizations Immunization History  Administered Date(s) Administered   PFIZER Comirnaty(Gray Top)Covid-19 Tri-Sucrose Vaccine 05/10/2022   PFIZER(Purple Top)SARS-COV-2 Vaccination 08/13/2019, 09/02/2019, 04/30/2020   Pfizer Covid-19 Vaccine Bivalent Booster 6yrs & up 05/19/2021   Pfizer(Comirnaty)Fall Seasonal Vaccine 12 years and older 04/20/2023   Pneumococcal Conjugate-13 01/20/2014   Pneumococcal Polysaccharide-23 01/03/2012   Tdap 05/25/2000, 01/03/2012   Zoster Recombinant(Shingrix) 05/11/2017, 10/26/2017    TDAP status: Due, Education has been provided regarding the importance of this vaccine. Advised may receive this vaccine at local pharmacy or Health Dept. Aware to provide a copy of the vaccination record if obtained from local pharmacy or Health Dept. Verbalized acceptance and understanding.  Flu Vaccine status: Declined, Education has been provided regarding the importance of this vaccine but patient still declined. Advised may receive this vaccine at local pharmacy or Health Dept. Aware to provide a copy of the vaccination record if obtained from local pharmacy or Health Dept. Verbalized acceptance and understanding.  Pneumococcal vaccine status: Up to date  Covid-19 vaccine status: Completed vaccines  Qualifies for Shingles Vaccine? Yes   Zostavax completed Yes   Shingrix Completed?: Yes  Screening Tests Health Maintenance  Topic Date Due   DTaP/Tdap/Td (3 - Td or Tdap) 01/02/2022   INFLUENZA VACCINE  10/23/2023 (Originally 02/23/2023)   COVID-19 Vaccine (7 - 2023-24 season) 08/20/2023   Medicare Annual Wellness (AWV)  05/22/2024   Pneumonia Vaccine 67+ Years old  Completed   Zoster Vaccines-  Shingrix  Completed   HPV VACCINES  Aged Out    Health Maintenance  Health Maintenance Due  Topic Date Due   DTaP/Tdap/Td (3 - Td or Tdap) 01/02/2022    Colorectal cancer screening: No longer required.   Lung Cancer Screening: (Low Dose CT Chest recommended if Age 1-80 years, 20 pack-year currently smoking OR have quit w/in 15years.) does not qualify.   Lung Cancer Screening Referral: no  Additional Screening:  Hepatitis C Screening: does not qualify;   Vision Screening: Recommended annual ophthalmology exams for early detection of glaucoma and other disorders of the eye. Is the patient up to date with their annual eye exam?  Yes  Who is the provider or what is the name of the office in which the patient attends annual eye exams? [facility] If pt is not established with a provider, would they like to be referred to a provider to establish care? No .   Dental Screening: Recommended annual dental exams for proper oral hygiene  Diabetic Foot Exam: n/a  Community Resource Referral / Chronic Care Management: CRR required this visit?  No   CCM required this visit?  No     Plan:     I have personally reviewed and noted the following in the patient's chart:   Medical and  social history Use of alcohol, tobacco or illicit drugs  Current medications and supplements including opioid prescriptions. Patient is not currently taking opioid prescriptions. Functional ability and status Nutritional status Physical activity Advanced directives List of other physicians Hospitalizations, surgeries, and ER visits in previous 12 months Vitals Screenings to include cognitive, depression, and falls Referrals and appointments  In addition, I have reviewed and discussed with patient certain preventive protocols, quality metrics, and best practice recommendations. A written personalized care plan for preventive services as well as general preventive health recommendations were  provided to patient.     Barb Merino, LPN   [id number]   After Visit Summary: (MyChart) Due to this being a telephonic visit, the after visit summary with patients personalized plan was offered to patient via MyChart   Nurse Notes: none

## 2023-05-23 NOTE — Patient Instructions (Addendum)
[name] , Thank you for taking time to come for your Medicare Wellness Visit. I appreciate your ongoing commitment to your health goals. Please review the following plan we discussed and let me know if I can assist you in the future.   Referrals/Orders/Follow-Ups/Clinician Recommendations: none  This is a list of the screening recommended for you and due dates:  Health Maintenance  Topic Date Due   DTaP/Tdap/Td vaccine (3 - Td or Tdap) 01/02/2022   Flu Shot  10/23/2023*   COVID-19 Vaccine (7 - 2023-24 season) 08/20/2023   Medicare Annual Wellness Visit  05/22/2024   Pneumonia Vaccine  Completed   Zoster (Shingles) Vaccine  Completed   HPV Vaccine  Aged Out  *Topic was postponed. The date shown is not the original due date.    Advanced directives: (In Chart) A copy of your advanced directives are scanned into your chart should your provider ever need it.  Next Medicare Annual Wellness Visit scheduled for next year: No, will schedule next year  insert Preventive Care attachment Insert FALL PREVENTION attachment if needed

## 2023-05-24 ENCOUNTER — Ambulatory Visit: Payer: Medicare Other | Admitting: Family Medicine

## 2023-05-24 ENCOUNTER — Other Ambulatory Visit: Payer: Self-pay

## 2023-05-24 ENCOUNTER — Encounter: Payer: Self-pay | Admitting: Family Medicine

## 2023-05-24 VITALS — BP 136/70 | HR 60 | Ht 66.0 in | Wt 141.2 lb

## 2023-05-24 DIAGNOSIS — M1990 Unspecified osteoarthritis, unspecified site: Secondary | ICD-10-CM | POA: Diagnosis not present

## 2023-05-24 DIAGNOSIS — M1711 Unilateral primary osteoarthritis, right knee: Secondary | ICD-10-CM

## 2023-05-24 DIAGNOSIS — D692 Other nonthrombocytopenic purpura: Secondary | ICD-10-CM

## 2023-05-24 DIAGNOSIS — E559 Vitamin D deficiency, unspecified: Secondary | ICD-10-CM | POA: Diagnosis not present

## 2023-05-24 DIAGNOSIS — H3500 Unspecified background retinopathy: Secondary | ICD-10-CM

## 2023-05-24 DIAGNOSIS — I10 Essential (primary) hypertension: Secondary | ICD-10-CM

## 2023-05-24 DIAGNOSIS — Z9849 Cataract extraction status, unspecified eye: Secondary | ICD-10-CM

## 2023-05-24 DIAGNOSIS — E785 Hyperlipidemia, unspecified: Secondary | ICD-10-CM | POA: Diagnosis not present

## 2023-05-24 DIAGNOSIS — M1712 Unilateral primary osteoarthritis, left knee: Secondary | ICD-10-CM | POA: Diagnosis not present

## 2023-05-24 DIAGNOSIS — N1831 Chronic kidney disease, stage 3a: Secondary | ICD-10-CM

## 2023-05-24 DIAGNOSIS — Z950 Presence of cardiac pacemaker: Secondary | ICD-10-CM

## 2023-05-24 DIAGNOSIS — G20A1 Parkinson's disease without dyskinesia, without mention of fluctuations: Secondary | ICD-10-CM | POA: Diagnosis not present

## 2023-05-24 DIAGNOSIS — K449 Diaphragmatic hernia without obstruction or gangrene: Secondary | ICD-10-CM

## 2023-05-24 DIAGNOSIS — Z0000 Encounter for general adult medical examination without abnormal findings: Secondary | ICD-10-CM

## 2023-05-24 DIAGNOSIS — J301 Allergic rhinitis due to pollen: Secondary | ICD-10-CM

## 2023-05-24 DIAGNOSIS — I442 Atrioventricular block, complete: Secondary | ICD-10-CM

## 2023-05-24 DIAGNOSIS — I700 Atherosclerosis of aorta: Secondary | ICD-10-CM

## 2023-05-24 DIAGNOSIS — K581 Irritable bowel syndrome with constipation: Secondary | ICD-10-CM

## 2023-05-24 DIAGNOSIS — Z8546 Personal history of malignant neoplasm of prostate: Secondary | ICD-10-CM

## 2023-05-24 DIAGNOSIS — Z860100 Personal history of colon polyps, unspecified: Secondary | ICD-10-CM

## 2023-05-24 DIAGNOSIS — K219 Gastro-esophageal reflux disease without esophagitis: Secondary | ICD-10-CM

## 2023-05-24 MED ORDER — AMLODIPINE BESYLATE 5 MG PO TABS
5.0000 mg | ORAL_TABLET | Freq: Every day | ORAL | 0 refills | Status: DC
Start: 2023-05-24 — End: 2023-09-12

## 2023-05-24 MED ORDER — AMLODIPINE BESYLATE 5 MG PO TABS
5.0000 mg | ORAL_TABLET | Freq: Every day | ORAL | 0 refills | Status: DC
Start: 2023-05-24 — End: 2023-05-24

## 2023-05-24 MED ORDER — ROSUVASTATIN CALCIUM 20 MG PO TABS
20.0000 mg | ORAL_TABLET | Freq: Every day | ORAL | 1 refills | Status: DC
Start: 2023-05-24 — End: 2023-09-18

## 2023-05-24 MED ORDER — ROSUVASTATIN CALCIUM 20 MG PO TABS
20.0000 mg | ORAL_TABLET | Freq: Every day | ORAL | 1 refills | Status: DC
Start: 2023-05-24 — End: 2023-05-24

## 2023-05-24 MED ORDER — HYDROCHLOROTHIAZIDE 12.5 MG PO TABS
12.5000 mg | ORAL_TABLET | Freq: Every day | ORAL | 0 refills | Status: DC
Start: 2023-05-24 — End: 2023-09-12

## 2023-05-24 MED ORDER — HYDROCHLOROTHIAZIDE 12.5 MG PO TABS
12.5000 mg | ORAL_TABLET | Freq: Every day | ORAL | 0 refills | Status: DC
Start: 2023-05-24 — End: 2023-05-24

## 2023-05-24 NOTE — Progress Notes (Signed)
Complete physical exam  Patient: [name]   DOB: 01-12-1932   89 y.o. Male  MRN: [id number]  Subjective:    Chief Complaint  Patient presents with   Annual Exam    [name] is a 89 y.o. male who presents today for a complete physical exam. He reports consuming a general diet. Home exercise routine includes 25 minutes in the morning of legs, back, and arms workouts. Also, 10-15 minutes on a recumbent bike. He generally feels fairly well. He reports sleeping well. He does have underlying allergies and these are not causing much difficulty.  He also has a history of aortic atherosclerosis and is taking Crestor.  He has complete heart block and sees cardiology remotely.  Does have a pacer.  He continues on amlodipine, HCTZ.  He is also taking Crestor and having no difficulty with that.  Uses Prilosec on an as-needed basis.  Does have a history of IBS and does use MiraLAX with fairly good results.  He has had recent hernia repair.  There is question of Parkinson's type symptoms but at this point he has not had a full evaluation of this and is presently not having a great deal difficulty.  He has had both his knees injected for treatment of his arthritis.  Does follow-up regularly with ophthalmology concerning the retinopathy.  Does have blood work indicating stage III CKD.  In the past he has had difficulty with back pain and has had injections.  He has been married for 61 years. Most recent fall risk assessment:    05/23/2023    4:10 PM  Fall Risk   Falls in the past year? 1  Comment tripped up the stairs  Number falls in past yr: 0  Injury with Fall? 0  Risk for fall due to : Medication side effect  Follow up Falls prevention discussed;Falls evaluation completed     Most recent depression screenings:    05/23/2023    4:12 PM 05/10/2022    9:30 AM  PHQ 2/9 Scores  PHQ - 2 Score 0 0  PHQ- 9 Score 0     Vision:Within last year and Dental: No current dental problems and  Last dental visit: April 2024    Patient Care Team: Ronnald Nian, MD as PCP - General (Family Medicine) Marinus Maw, MD as PCP - Electrophysiology (Cardiology) Pa, [facility]   Outpatient Medications Prior to Visit  Medication Sig   alclomethasone (ACLOVATE) 0.05 % cream APPLY TWICE DAILY   amLODipine (NORVASC) 5 MG tablet TAKE 1 TABLET BY MOUTH DAILY   cholecalciferol (VITAMIN D3) 25 MCG (1000 UNIT) tablet Take 1 tablet (1,000 Units total) by mouth daily.   hydrochlorothiazide (HYDRODIURIL) 12.5 MG tablet TAKE 1 TABLET BY MOUTH DAILY   loratadine (CLARITIN) 10 MG tablet Take 10 mg by mouth daily.   omeprazole (PRILOSEC) 20 MG capsule TAKE 1 CAPSULE BY MOUTH  TWICE DAILY BEFORE MEALS   polyethylene glycol (MIRALAX / GLYCOLAX) 17 g packet Take 17 g by mouth daily.   rosuvastatin (CRESTOR) 20 MG tablet TAKE 1 TABLET BY MOUTH DAILY   acetaminophen (TYLENOL) 500 MG tablet Take 500-1,000 mg by mouth every 6 (six) hours as needed for moderate pain or mild pain. (Patient not taking: Reported on 05/24/2023)   No facility-administered medications prior to visit.    Review of Systems  All other systems reviewed and are negative.         Objective:  BP 136/70   Pulse 60   Ht 5\' 6"  (1.676 m)   Wt 141 lb 3.2 oz (64 kg)   SpO2 95%   BMI 22.79 kg/m    Physical Exam  Alert and in no distress. Tympanic membranes and canals are normal. Pharyngeal area is normal. Neck is supple without adenopathy or thyromegaly. Cardiac exam shows an irregular  rhythm without murmurs or gallops. Lungs are clear to auscultation. Purpuric lesions noted on forearms.     Assessment & Plan:    Routine general medical examination at a health care facility  Non-seasonal allergic rhinitis due to pollen  Aortic atherosclerosis (HCC) - Plan: Lipid panel  Arthritis  Complete heart block (HCC)  Essential hypertension  Gastroesophageal reflux disease without esophagitis  HH  (hiatus hernia)  History of cataract extraction, unspecified laterality  History of colonic polyps  Irritable bowel syndrome with constipation  History of prostate cancer - 1997  Hyperlipidemia LDL goal <130  Pacemaker  Parkinson's disease without dyskinesia or fluctuating manifestations (HCC)  Stage 3a chronic kidney disease (HCC)  Senile purpura (HCC)  Retinopathy   Primary osteoarthritis of right knee  Primary osteoarthritis of left knee  Vitamin D deficiency - Plan: VITAMIN D 25 Hydroxy (Vit-D Deficiency, Fractures)  Immunization History  Administered Date(s) Administered   PFIZER Comirnaty(Gray Top)Covid-19 Tri-Sucrose Vaccine 05/10/2022   PFIZER(Purple Top)SARS-COV-2 Vaccination 08/13/2019, 09/02/2019, 04/30/2020   Pfizer Covid-19 Vaccine Bivalent Booster 27yrs & up 05/19/2021   Pfizer(Comirnaty)Fall Seasonal Vaccine 12 years and older 04/20/2023   Pneumococcal Conjugate-13 01/20/2014   Pneumococcal Polysaccharide-23 01/03/2012   Tdap 05/25/2000, 01/03/2012   Zoster Recombinant(Shingrix) 05/11/2017, 10/26/2017    Health Maintenance  Topic Date Due   DTaP/Tdap/Td (3 - Td or Tdap) 01/02/2022   INFLUENZA VACCINE  10/23/2023 (Originally 02/23/2023)   COVID-19 Vaccine (7 - 2023-24 season) 08/20/2023   Medicare Annual Wellness (AWV)  05/22/2024   Pneumonia Vaccine 81+ Years old  Completed   Zoster Vaccines- Shingrix  Completed   HPV VACCINES  Aged Out    Discussed staying as physically active as he can.  He will continue on his present medication regimen.  Discussed possible injections of his knee in the future with me or with orthopedics.  He will continue be followed by cardiology.  Continue on MiraLAX to help with his IBS.  Also take Prilosec on an as-needed basis.  Problem List Items Addressed This Visit     Allergic rhinitis   Aortic atherosclerosis (HCC)   Relevant Orders   Lipid panel   Arthritis   Complete heart block (HCC)   Essential hypertension    Gastroesophageal reflux disease   HH (hiatus hernia)   History of cataract extraction   History of colonic polyps   History of prostate cancer   Hyperlipidemia LDL goal <130   Irritable bowel syndrome with constipation   Pacemaker   Parkinsonism (HCC)   Primary osteoarthritis of left knee   Primary osteoarthritis of right knee   Retinopathy    Senile purpura (HCC)   Stage 3a chronic kidney disease (HCC)   Other Visit Diagnoses     Routine general medical examination at a health care facility    -  Primary   Vitamin D deficiency       Relevant Orders   VITAMIN D 25 Hydroxy (Vit-D Deficiency, Fractures)      Follow-up 1 year    Sharlot Gowda, MD

## 2023-05-25 LAB — LIPID PANEL
Chol/HDL Ratio: 2.4 ratio (ref 0.0–5.0)
Cholesterol, Total: 180 mg/dL (ref 100–199)
HDL: 74 mg/dL (ref >39)
LDL Chol Calc (NIH): 90 mg/dL (ref 0–99)
Triglycerides: 88 mg/dL (ref 0–149)
VLDL Cholesterol Cal: 16 mg/dL (ref 5–40)

## 2023-05-25 LAB — VITAMIN D 25 HYDROXY (VIT D DEFICIENCY, FRACTURES): Vit D, 25-Hydroxy: 29.3 ng/mL — ABNORMAL LOW (ref 30.0–100.0)

## 2023-06-13 ENCOUNTER — Other Ambulatory Visit: Payer: Self-pay | Admitting: Family Medicine

## 2023-06-13 DIAGNOSIS — K449 Diaphragmatic hernia without obstruction or gangrene: Secondary | ICD-10-CM

## 2023-06-16 NOTE — Telephone Encounter (Signed)
[name] It should be ok to take the Phazyme over the counter medication. I would inform you to follow the directions on the packaging. Was also informed it can cause constipation, be careful about intake.

## 2023-07-18 ENCOUNTER — Ambulatory Visit (INDEPENDENT_AMBULATORY_CARE_PROVIDER_SITE_OTHER): Payer: Medicare Other

## 2023-07-18 DIAGNOSIS — I442 Atrioventricular block, complete: Secondary | ICD-10-CM | POA: Diagnosis not present

## 2023-07-18 LAB — CUP PACEART REMOTE DEVICE CHECK
Battery Remaining Longevity: 38 mo
Battery Voltage: 2.93 V
Brady Statistic AP VP Percent: 68.29 %
Brady Statistic AP VS Percent: 0.15 %
Brady Statistic AS VP Percent: 30.7 %
Brady Statistic AS VS Percent: 0.86 %
Brady Statistic RA Percent Paced: 68.1 %
Brady Statistic RV Percent Paced: 98.99 %
Date Time Interrogation Session: 20241224015052
Implantable Lead Connection Status: 753985
Implantable Lead Connection Status: 753985
Implantable Lead Implant Date: 20180620
Implantable Lead Implant Date: 20180620
Implantable Lead Location: 753859
Implantable Lead Location: 753860
Implantable Lead Model: 5076
Implantable Lead Model: 5076
Implantable Pulse Generator Implant Date: 20180620
Lead Channel Impedance Value: 285 Ohm
Lead Channel Impedance Value: 342 Ohm
Lead Channel Impedance Value: 380 Ohm
Lead Channel Impedance Value: 380 Ohm
Lead Channel Pacing Threshold Amplitude: 0.375 V
Lead Channel Pacing Threshold Amplitude: 0.875 V
Lead Channel Pacing Threshold Pulse Width: 0.4 ms
Lead Channel Pacing Threshold Pulse Width: 0.4 ms
Lead Channel Sensing Intrinsic Amplitude: 3.625 mV
Lead Channel Sensing Intrinsic Amplitude: 3.625 mV
Lead Channel Sensing Intrinsic Amplitude: 5 mV
Lead Channel Sensing Intrinsic Amplitude: 5 mV
Lead Channel Setting Pacing Amplitude: 1.5 V
Lead Channel Setting Pacing Amplitude: 2.5 V
Lead Channel Setting Pacing Pulse Width: 0.4 ms
Lead Channel Setting Sensing Sensitivity: 2 mV
Zone Setting Status: 755011
Zone Setting Status: 755011

## 2023-08-10 DIAGNOSIS — H40013 Open angle with borderline findings, low risk, bilateral: Secondary | ICD-10-CM | POA: Diagnosis not present

## 2023-08-23 ENCOUNTER — Encounter: Payer: Self-pay | Admitting: Internal Medicine

## 2023-08-28 NOTE — Progress Notes (Signed)
 Remote pacemaker transmission.

## 2023-09-07 ENCOUNTER — Telehealth: Payer: Self-pay | Admitting: Family Medicine

## 2023-09-07 NOTE — Telephone Encounter (Signed)
Pt called and states that he tested positive for covid today, said he started feeling bas yesterday afternoon, only really has congestion and drainage, he wants to know if there is anything he needs to be doing extra, he is taking ibuprofen every 6 hours for it   Pt can be reached at [phone]

## 2023-09-08 ENCOUNTER — Other Ambulatory Visit: Payer: Self-pay | Admitting: Nurse Practitioner

## 2023-09-08 ENCOUNTER — Telehealth: Payer: Self-pay | Admitting: Family Medicine

## 2023-09-08 DIAGNOSIS — U071 COVID-19: Secondary | ICD-10-CM

## 2023-09-08 MED ORDER — PSEUDOEPH-BROMPHEN-DM 30-2-10 MG/5ML PO SYRP
5.0000 mL | ORAL_SOLUTION | Freq: Four times a day (QID) | ORAL | 0 refills | Status: AC | PRN
Start: 2023-09-08 — End: ?

## 2023-09-08 NOTE — Telephone Encounter (Signed)
Pt called, he has COVID & states he talked with Dr. Susann Givens yesterday & has been taking Sudafed at night, but not helping with drainage at night & wants to know what else he can take for the drainage at night?

## 2023-09-08 NOTE — Telephone Encounter (Signed)
No appts were available, so Huntley Dec called in a cough medicine, called pt and informed

## 2023-09-11 NOTE — Telephone Encounter (Signed)
Called pt reached voice mail.  Left message wanting update on condition.

## 2023-09-12 ENCOUNTER — Other Ambulatory Visit: Payer: Self-pay | Admitting: Family Medicine

## 2023-09-12 DIAGNOSIS — I10 Essential (primary) hypertension: Secondary | ICD-10-CM

## 2023-09-14 ENCOUNTER — Telehealth: Payer: Self-pay

## 2023-09-14 NOTE — Telephone Encounter (Signed)
Added to allergies list.

## 2023-09-14 NOTE — Telephone Encounter (Signed)
Copied from CRM [id number]. Topic: Clinical - Prescription Issue >> Sep 14, 2023 12:18 PM [name] wrote: Reason for CRM: Patient is calling to let us know never to prescribe brompheniramine-pseudoephedrine-DM 30-2-10 MG/5ML syrup. Patient tested positive for covid on 2/14 and the medicine made him go the bathroom all night.

## 2023-09-16 ENCOUNTER — Other Ambulatory Visit: Payer: Self-pay | Admitting: Family Medicine

## 2023-09-16 DIAGNOSIS — I700 Atherosclerosis of aorta: Secondary | ICD-10-CM

## 2023-09-18 ENCOUNTER — Ambulatory Visit: Payer: Self-pay | Admitting: Family Medicine

## 2023-09-18 ENCOUNTER — Telehealth (INDEPENDENT_AMBULATORY_CARE_PROVIDER_SITE_OTHER): Payer: Medicare Other | Admitting: Medical

## 2023-09-18 DIAGNOSIS — R052 Subacute cough: Secondary | ICD-10-CM | POA: Diagnosis not present

## 2023-09-18 DIAGNOSIS — J3489 Other specified disorders of nose and nasal sinuses: Secondary | ICD-10-CM | POA: Diagnosis not present

## 2023-09-18 DIAGNOSIS — Z8616 Personal history of COVID-19: Secondary | ICD-10-CM

## 2023-09-18 MED ORDER — AMOXICILLIN 875 MG PO TABS: 875.0000 mg | ORAL_TABLET | Freq: Two times a day (BID) | ORAL | 0 refills | Status: AC

## 2023-09-18 MED ORDER — DM-GUAIFENESIN ER 30-600 MG PO TB12: 1.0000 | ORAL_TABLET | Freq: Two times a day (BID) | ORAL | 0 refills | Status: AC

## 2023-09-18 NOTE — Progress Notes (Signed)
 Subjective:     Patient ID: [name], male   DOB: 01-16-1932, 89 y.o.   MRN: [id number]  This visit type was conducted due to national recommendations for restrictions regarding the COVID-19 Pandemic (e.g. social distancing) in an effort to limit this patient's exposure and mitigate transmission in our community.  Due to their co-morbid illnesses, this patient is at least at moderate risk for complications without adequate follow up.  This format is felt to be most appropriate for this patient at this time.    Documentation for virtual audio and video telecommunications through [place] encounter:  The patient was located at home. The provider was located in the office. The patient did consent to this visit and is aware of possible charges through their insurance for this visit.  The other persons participating in this telemedicine service were none. Time spent on call was 20 minutes and in review of previous records 20 minutes total.  This virtual service is not related to other E/M service within previous 7 days.   HPI Chief Complaint  Patient presents with   Covid Positive    Covid positive- 2/12. Symptoms still having sinus drainage, no fever, some chest congestion, taking some claritin   Virtual consult for illness.  Symptoms have persisted since 12 days ago.  He tested positive for COVID on September 06, 2023.  He never got terribly sick but he has had ongoing drainage at night, lots of drainage in the late evening, coughing up yellow phlegm, but no fever, no bodyaches, no chills, no nausea vomiting or diarrhea, no wheezing or shortness of breath, blood-tinged mucus.  Water intake is really good.  He has a pulse oximeter at home and those numbers have been normal No other aggravating or relieving factors. No other complaint.    Past Medical History:  Diagnosis Date   1st degree AV block    Complete Heart block   Aortic atherosclerosis (HCC) 05/03/2022   Arthritis     "hands, back, neck, shoulders, knees" (01/11/2017)   Eczema    GERD (gastroesophageal reflux disease)    HH (hiatus hernia)    History of blood transfusion    "maybe; w/prostate OR; they would have used my own" (01/11/2017)   History of colonic polyps    "I don't think I've had any polyps" (01/11/2017)   Hypercholesterolemia    Hypertension    Presence of permanent cardiac pacemaker    Prostate cancer (HCC) 1997   Seasonal allergies    SVT (supraventricular tachycardia) (HCC)    Current Outpatient Medications on File Prior to Visit  Medication Sig Dispense Refill   amLODipine (NORVASC) 5 MG tablet TAKE 1 TABLET BY MOUTH DAILY 90 tablet 2   cholecalciferol (VITAMIN D3) 25 MCG (1000 UNIT) tablet Take 1 tablet (1,000 Units total) by mouth daily. 90 tablet 3   hydrochlorothiazide (HYDRODIURIL) 12.5 MG tablet TAKE 1 TABLET BY MOUTH DAILY 90 tablet 2   loratadine (CLARITIN) 10 MG tablet Take 10 mg by mouth daily.     omeprazole (PRILOSEC) 20 MG capsule TAKE 1 CAPSULE BY MOUTH TWICE  DAILY BEFORE MEALS 180 capsule 3   polyethylene glycol (MIRALAX / GLYCOLAX) 17 g packet Take 17 g by mouth daily.     rosuvastatin (CRESTOR) 20 MG tablet TAKE 1 TABLET BY MOUTH DAILY 90 tablet 2   acetaminophen (TYLENOL) 500 MG tablet Take 500-1,000 mg by mouth every 6 (six) hours as needed for moderate pain or mild pain. (Patient not taking: Reported  on 05/24/2023)     alclomethasone (ACLOVATE) 0.05 % cream APPLY TWICE DAILY (Patient not taking: Reported on 09/18/2023) 30 g 3   brompheniramine-pseudoephedrine-DM 30-2-10 MG/5ML syrup Take 5 mLs by mouth 4 (four) times daily as needed (congestion, drainage, cough). 120 mL 0   No current facility-administered medications on file prior to visit.    Review of Systems As in subjective    Objective:   Physical Exam Due to coronavirus pandemic stay at home measures, patient visit was virtual and they were not examined in person.   There were no vitals taken for this  visit.  Gen: Well-developed, well-nourished, no acute distress No labored breathing or wheezing Psych: Answers questions appropriately      Assessment:     Encounter Diagnoses  Name Primary?   History of COVID-19 Yes   Purulent nasal discharge    Subacute cough        Plan:     You have had a recent positive test for COVID.  Fortunately you did not have a severe illness but you have residual drainage and congestion.  Given your ongoing symptoms, I recommend amoxicillin antibiotic twice a day for 10 days  Begin Mucinex DM over-the-counter twice a day for the next 5 days  Continue good water intake  Consider nasal saline flush and salt water gargles to get rid of the mucus in the sinuses and the back of the throat  If you do not feel completely better within the next week then let us know    [name] was seen today for covid positive.  Diagnoses and all orders for this visit:  History of COVID-19  Purulent nasal discharge  Subacute cough  Other orders -     amoxicillin (AMOXIL) 875 MG tablet; Take 1 tablet (875 mg total) by mouth 2 (two) times daily for 10 days. -     dextromethorphan-guaiFENesin (MUCINEX DM) 30-600 MG 12hr tablet; Take 1 tablet by mouth 2 (two) times daily.    F/u prn

## 2023-09-18 NOTE — Telephone Encounter (Signed)
 Copied from CRM [id number]. Topic: Clinical - Medical Advice >> Sep 18, 2023 10:42 AM Patsy Lager T wrote: Reason for CRM: patient called stated he tested positive for Covid on 2/12 and he is still testing positive. He is not having any symptoms but wanted provider to know. He also has a lot of sinus drainage and wants to know if the dr can prescribe something for him  Chief Complaint: sinus drainage Symptoms: sinus drainage Frequency: constant Pertinent Negatives: Patient denies sob, cough Disposition: [] ED /[] Urgent Care (no appt availability in office) / [] Appointment(In office/virtual)/ []  [place] [facility]/ [x] Home Care/ [] Refused Recommended Disposition /[] [place] [facility]/ []  Follow-up with PCP Additional Notes: wanted recommendations on medication to take for sinus drainage.  Asked to have PCP call meds in  and daughter is at pharmacy, she will talk to pharmacist to see if something otc will work.  PCP office updated.   Reason for Disposition  [1] Caller has medicine question about med NOT prescribed by PCP AND [2] triager unable to answer question (e.g., compatibility with other med, storage)  Answer Assessment - Initial Assessment Questions 1. NAME of MEDICINE: "What medicine(s) are you calling about?"     Would like medication for sinus drainage 2. QUESTION: "What is your question?" (e.g., double dose of medicine, side effect)     Wants medication called in  3. PRESCRIBER: "Who prescribed the medicine?" Reason: if prescribed by specialist, call should be referred to that group.     unknown 4. SYMPTOMS: "Do you have any symptoms?" If Yes, ask: "What symptoms are you having?"  "How bad are the symptoms (e.g., mild, moderate, severe)     Sinus drainage. 5. PREGNANCY:  "Is there any chance that you are pregnant?" "When was your last menstrual period?"     na  Protocols used: Medication Question Call-A-AH

## 2023-09-18 NOTE — Patient Instructions (Signed)
 You have had a recent positive test for COVID.  Fortunately you did not have a severe illness but you have residual drainage and congestion.  Given your ongoing symptoms, I recommend amoxicillin antibiotic twice a day for 10 days  Begin Mucinex DM over-the-counter twice a day for the next 5 days  Continue good water intake  Consider nasal saline flush and salt water gargles to get rid of the mucus in the sinuses and the back of the throat  If you do not feel completely better within the next week then let us know

## 2023-09-19 ENCOUNTER — Encounter: Payer: Self-pay | Admitting: Internal Medicine

## 2023-09-19 NOTE — Progress Notes (Signed)
 done

## 2023-09-27 ENCOUNTER — Telehealth: Payer: Self-pay | Admitting: Family Medicine

## 2023-09-27 ENCOUNTER — Telehealth: Payer: Self-pay

## 2023-09-27 ENCOUNTER — Ambulatory Visit: Payer: Self-pay | Admitting: Family Medicine

## 2023-09-27 NOTE — Telephone Encounter (Signed)
 Copied from CRM [id number]. Topic: Clinical - Medication Question >> Sep 27, 2023 11:24 AM Fuller Mandril wrote: Reason for CRM: Patient called states he was seen for Covid and had a adverse reaction to prescribed medication: brompheniramine-pseudoephedrine-DM 30-2-10 MG/5ML syrup stomach cramps an couldn't sleep at night. States he stopped taking medication and is not having current symptoms. Also states he would like to speak with provider, Dr Susann Givens about why he was not prescribed Paxlovid. Would like a call back from clinic. Thank You   Chief Complaint: Medication Question Symptoms: None at this time Frequency: N/A  Disposition: [] ED /[] Urgent Care (no appt availability in office) / [] Appointment(In office/virtual)/ []  [place] [facility]/ [] Home Care/ [] Refused Recommended Disposition /[] [place] [facility]/ [x]  Follow-up with PCP Additional Notes: Contacted [facility] regarding patient's concerns and inquiries. Informed that the clinical staff would reach out to the patient to answer his questions. Contacted patient to inform him of this and the patient verbalized understanding.

## 2023-09-27 NOTE — Telephone Encounter (Signed)
 Pt contacted the call center. He states the syrup that he was put on made him feel very nauseous and have stomach cramps so he did stop taking it.  He also wants to know why he wasn't prescribed paxlovid?  He is requesting a call back from someone clinical.

## 2023-09-27 NOTE — Telephone Encounter (Signed)
 Pt. Called and spoke with [name] wanted to know why Dr. Susann Givens did not call him in Paxlovid the other day on 09/07/23 when he called and talked to you and let you know he had tested positive for covid. His daughter wanted him to ask. It looks like there are several telephone messages in the system. He called again on 09/08/23 spoke with Gaye Alken reviewed the message and stated the pt. Needed an virtual appointment but Les Pou was the only provider here that afternoon and she did not have anymore appointments available. She she did end up sending some medication for him. But the pt. Just wanted to know why after speaking you Dr. Susann Givens why did he not send in Paxlovid.

## 2023-10-18 ENCOUNTER — Ambulatory Visit: Payer: Medicare Other

## 2023-10-18 DIAGNOSIS — I442 Atrioventricular block, complete: Secondary | ICD-10-CM | POA: Diagnosis not present

## 2023-10-18 LAB — CUP PACEART REMOTE DEVICE CHECK
Battery Remaining Longevity: 35 mo
Battery Voltage: 2.92 V
Brady Statistic AP VP Percent: 65.03 %
Brady Statistic AP VS Percent: 0.12 %
Brady Statistic AS VP Percent: 34.37 %
Brady Statistic AS VS Percent: 0.48 %
Brady Statistic RA Percent Paced: 64.82 %
Brady Statistic RV Percent Paced: 99.4 %
Date Time Interrogation Session: 20250326071609
Implantable Lead Connection Status: 753985
Implantable Lead Connection Status: 753985
Implantable Lead Implant Date: 20180620
Implantable Lead Implant Date: 20180620
Implantable Lead Location: 753859
Implantable Lead Location: 753860
Implantable Lead Model: 5076
Implantable Lead Model: 5076
Implantable Pulse Generator Implant Date: 20180620
Lead Channel Impedance Value: 285 Ohm
Lead Channel Impedance Value: 323 Ohm
Lead Channel Impedance Value: 361 Ohm
Lead Channel Impedance Value: 380 Ohm
Lead Channel Pacing Threshold Amplitude: 0.375 V
Lead Channel Pacing Threshold Amplitude: 0.75 V
Lead Channel Pacing Threshold Pulse Width: 0.4 ms
Lead Channel Pacing Threshold Pulse Width: 0.4 ms
Lead Channel Sensing Intrinsic Amplitude: 3 mV
Lead Channel Sensing Intrinsic Amplitude: 3 mV
Lead Channel Sensing Intrinsic Amplitude: 3.5 mV
Lead Channel Sensing Intrinsic Amplitude: 3.5 mV
Lead Channel Setting Pacing Amplitude: 1.5 V
Lead Channel Setting Pacing Amplitude: 2.5 V
Lead Channel Setting Pacing Pulse Width: 0.4 ms
Lead Channel Setting Sensing Sensitivity: 2 mV
Zone Setting Status: 755011
Zone Setting Status: 755011

## 2023-10-27 ENCOUNTER — Encounter: Payer: Self-pay | Admitting: Podiatry

## 2023-10-27 ENCOUNTER — Ambulatory Visit: Admitting: Podiatry

## 2023-10-27 DIAGNOSIS — L600 Ingrowing nail: Secondary | ICD-10-CM | POA: Diagnosis not present

## 2023-10-27 NOTE — Progress Notes (Signed)
 Subjective:  Patient ID: [name], male    DOB: [birth year],  MRN: [id number]  Chief Complaint  Patient presents with   Toe Pain    Hallux left - medial border, tender x few weeks, tried trimming, hallux right is brittle and discolored, but not sore    89 y.o. male presents with the above complaint.  Patient presents with left hallux medial border ingrown painful to touch is discolored but not sore he would like to have it removed has not seen anyone as prior to seeing me denies any other acute complaints pain scale 7 out of 10 dull aching nature   Review of Systems: Negative except as noted in the HPI. Denies N/V/F/Ch.  Past Medical History:  Diagnosis Date   1st degree AV block    Complete Heart block   Aortic atherosclerosis (HCC) 05/03/2022   Arthritis    "hands, back, neck, shoulders, knees" (01/11/2017)   Eczema    GERD (gastroesophageal reflux disease)    HH (hiatus hernia)    History of blood transfusion    "maybe; w/prostate OR; they would have used my own" (01/11/2017)   History of colonic polyps    "I don't think I've had any polyps" (01/11/2017)   Hypercholesterolemia    Hypertension    Presence of permanent cardiac pacemaker    Prostate cancer (HCC) 1997   Seasonal allergies    SVT (supraventricular tachycardia) (HCC)     Current Outpatient Medications:    acetaminophen (TYLENOL) 500 MG tablet, Take 500-1,000 mg by mouth every 6 (six) hours as needed for moderate pain or mild pain. (Patient not taking: Reported on 05/24/2023), Disp: , Rfl:    alclomethasone (ACLOVATE) 0.05 % cream, APPLY TOPICALLY TWICE DAILY, Disp: 90 g, Rfl: 1   amLODipine (NORVASC) 5 MG tablet, TAKE 1 TABLET BY MOUTH DAILY, Disp: 90 tablet, Rfl: 2   brompheniramine-pseudoephedrine-DM 30-2-10 MG/5ML syrup, Take 5 mLs by mouth 4 (four) times daily as needed (congestion, drainage, cough)., Disp: 120 mL, Rfl: 0   cholecalciferol (VITAMIN D3) 25 MCG (1000 UNIT) tablet, Take 1 tablet (1,000  Units total) by mouth daily., Disp: 90 tablet, Rfl: 3   dextromethorphan-guaiFENesin (MUCINEX DM) 30-600 MG 12hr tablet, Take 1 tablet by mouth 2 (two) times daily., Disp: 10 tablet, Rfl: 0   hydrochlorothiazide (HYDRODIURIL) 12.5 MG tablet, TAKE 1 TABLET BY MOUTH DAILY, Disp: 90 tablet, Rfl: 2   loratadine (CLARITIN) 10 MG tablet, Take 10 mg by mouth daily., Disp: , Rfl:    omeprazole (PRILOSEC) 20 MG capsule, TAKE 1 CAPSULE BY MOUTH TWICE  DAILY BEFORE MEALS, Disp: 180 capsule, Rfl: 3   polyethylene glycol (MIRALAX / GLYCOLAX) 17 g packet, Take 17 g by mouth daily., Disp: , Rfl:    rosuvastatin (CRESTOR) 20 MG tablet, TAKE 1 TABLET BY MOUTH DAILY, Disp: 90 tablet, Rfl: 2  Social History   Tobacco Use  Smoking Status Former   Average packs/day: 1.5 packs/day for 13.0 years (19.5 ttl pk-yrs)   Types: Cigarettes   Start date: 08/25/1962   Quit date: 08/25/1961   Years since quitting: 62.2  Smokeless Tobacco Never    Allergies  Allergen Reactions   Brompheniramine-Pseudoeph Other (See Comments)    Makes pt use the bathroom frequently.    Codeine Nausea And Vomiting   Objective:  There were no vitals filed for this visit. There is no height or weight on file to calculate BMI. Constitutional Well developed. Well nourished.  Vascular Dorsalis pedis pulses palpable bilaterally.  Posterior tibial pulses palpable bilaterally. Capillary refill normal to all digits.  No cyanosis or clubbing noted. Pedal hair growth normal.  Neurologic Normal speech. Oriented to person, place, and time. Epicritic sensation to light touch grossly present bilaterally.  Dermatologic Painful ingrowing nail at medial nail borders of the hallux nail left. No other open wounds. No skin lesions.  Orthopedic: Normal joint ROM without pain or crepitus bilaterally. No visible deformities. No bony tenderness.   Radiographs: None Assessment:   1. Ingrown left big toenail    Plan:  Patient was evaluated and  treated and all questions answered.  Ingrown Nail, left -Patient elects to proceed with minor surgery to remove ingrown toenail removal today. Consent reviewed and signed by patient. -Ingrown nail excised. See procedure note. -Educated on post-procedure care including soaking. Written instructions provided and reviewed. -Patient to follow up in 2 weeks for nail check.  Procedure: Excision of Ingrown Toenail Location: Left 1st toe medial nail borders. Anesthesia: Lidocaine 1% plain; 1.5 mL and Marcaine 0.5% plain; 1.5 mL, digital block. Skin Prep: Betadine. Dressing: Silvadene; telfa; dry, sterile, compression dressing. Technique: Following skin prep, the toe was exsanguinated and a tourniquet was secured at the base of the toe. The affected nail border was freed, split with a nail splitter, and excised. Chemical matrixectomy was then performed with phenol and irrigated out with alcohol. The tourniquet was then removed and sterile dressing applied. Disposition: Patient tolerated procedure well. Patient to return in 2 weeks for follow-up.   No follow-ups on file.

## 2023-11-04 ENCOUNTER — Encounter: Payer: Self-pay | Admitting: Internal Medicine

## 2023-11-06 ENCOUNTER — Other Ambulatory Visit: Payer: Self-pay | Admitting: Family Medicine

## 2023-11-06 NOTE — Telephone Encounter (Signed)
 Is this okay to refill?

## 2023-11-09 ENCOUNTER — Telehealth: Payer: Self-pay

## 2023-11-09 MED ORDER — TRIAMCINOLONE ACETONIDE 0.025 % EX CREA: 1.0000 | TOPICAL_CREAM | Freq: Two times a day (BID) | CUTANEOUS | 0 refills | Status: DC

## 2023-11-09 NOTE — Progress Notes (Signed)
   11/09/2023  Patient ID: [name], male   DOB: [birth year], 89 y.o.   MRN: [id number]  Received notification from patient's pharmacy that alclometasone cream had bee discontinued. Attempted to contact patient to discuss alternatives covered by insurance.  Kenalog is most affordable with insurance (they still do not cover his preferred desonide).   Sending in 0.025% tube with PCP approval as patient will be using sparingly on his nose (equivalent to dose of alclometasone he was using). Patient still has alclometasone tube at home that will last him 3-4 months, so wants rx placed on file until he requests it at pharmacy.   Carnell Christian, PharmD Clinical Pharmacist [phone]

## 2023-11-23 ENCOUNTER — Ambulatory Visit: Admitting: Orthopaedic Surgery

## 2023-11-23 DIAGNOSIS — M1712 Unilateral primary osteoarthritis, left knee: Secondary | ICD-10-CM

## 2023-11-23 DIAGNOSIS — M170 Bilateral primary osteoarthritis of knee: Secondary | ICD-10-CM

## 2023-11-23 DIAGNOSIS — M1711 Unilateral primary osteoarthritis, right knee: Secondary | ICD-10-CM

## 2023-11-23 MED ORDER — LIDOCAINE HCL 1 % IJ SOLN
2.0000 mL | INTRAMUSCULAR | Status: AC | PRN
  Administered 2023-11-23: 2 mL

## 2023-11-23 MED ORDER — BUPIVACAINE HCL 0.5 % IJ SOLN
2.0000 mL | INTRAMUSCULAR | Status: AC | PRN
  Administered 2023-11-23: 2 mL via INTRA_ARTICULAR

## 2023-11-23 MED ORDER — METHYLPREDNISOLONE ACETATE 40 MG/ML IJ SUSP
40.0000 mg | INTRAMUSCULAR | Status: AC | PRN
  Administered 2023-11-23: 40 mg via INTRA_ARTICULAR

## 2023-11-23 NOTE — Progress Notes (Signed)
 Office Visit Note   Patient: [name]           Date of Birth: [birth year]           MRN: [id number] Visit Date: 11/23/2023              Requested by: Watson Hacking, MD [address] [place],  [place] [address] PCP: Watson Hacking, MD   Assessment & Plan: Visit Diagnoses:  1. Primary osteoarthritis of right knee   2. Primary osteoarthritis of left knee     Plan: Assessment and Plan    Bilateral Knee pain due to OA Chronic bilateral knee pain, left knee more functional. Knee replacement contraindicated due to age. Prefers injections over surgery. - Administer knee injections for pain management in both knees.  Goals of Care Prefers injections to maintain quality of life, focusing on mobility and independence.     Follow-Up Instructions: No follow-ups on file.   Orders:  Orders Placed This Encounter  Procedures   Large Joint Inj: bilateral knee   No orders of the defined types were placed in this encounter.     Procedures: Large Joint Inj: bilateral knee on 11/23/2023 10:51 AM Indications: pain Details: 22 G needle  Arthrogram: No  Medications (Right): 2 mL lidocaine  1 %; 2 mL bupivacaine  0.5 %; 40 mg methylPREDNISolone  acetate 40 MG/ML Medications (Left): 2 mL lidocaine  1 %; 2 mL bupivacaine  0.5 %; 40 mg methylPREDNISolone  acetate 40 MG/ML Outcome: tolerated well, no immediate complications Patient was prepped and draped in the usual sterile fashion.      Subjective: Chief Complaint  Patient presents with   Right Knee - Pain   Left Knee - Pain    HPI Discussed the use of AI scribe software for clinical note transcription with the patient, who gave verbal consent to proceed.  History of Present Illness   [name] is a 89 year old male who presents for bilateral knee osteoarthritis.  He experiences increasing problems with his knees, with his previously 'good knee' now causing issues. He uses a cane for support. Previous knee  injections have been beneficial, though he wishes they lasted longer. He is not considering knee replacement surgery due to his age.      Review of Systems  Constitutional: Negative.   HENT: Negative.    Eyes: Negative.   Respiratory: Negative.    Cardiovascular: Negative.   Gastrointestinal: Negative.   Endocrine: Negative.   Genitourinary: Negative.   Skin: Negative.   Allergic/Immunologic: Negative.   Neurological: Negative.   Hematological: Negative.   Psychiatric/Behavioral: Negative.    All other systems reviewed and are negative.    Objective: Vital Signs: There were no vitals taken for this visit.  Physical Exam Vitals and nursing note reviewed.  Constitutional:      Appearance: He is well-developed.  Pulmonary:     Effort: Pulmonary effort is normal.  Abdominal:     Palpations: Abdomen is soft.  Skin:    General: Skin is warm.  Neurological:     Mental Status: He is alert and oriented to person, place, and time.  Psychiatric:        Behavior: Behavior normal.        Thought Content: Thought content normal.        Judgment: Judgment normal.     Ortho Exam Physical Exam   Bilateral knee exam unchanged from prior visit.      Specialty Comments:  EXAM: CT LUMBAR SPINE  WITHOUT CONTRAST   TECHNIQUE: Multidetector CT imaging of the lumbar spine was performed without intravenous contrast administration. Multiplanar CT image reconstructions were also generated.   COMPARISON:  Lumbar spine radiographs 02/01/2018   FINDINGS: Segmentation: Normal   Alignment: Mild retrolisthesis L1-2, L2-3, L3-4. Mild anterolisthesis L4-5 approximately 4 mm. 5 mm retrolisthesis L5-S1. Mild lumbar scoliosis.   Vertebrae: Negative for fracture or mass   Paraspinal and other soft tissues: Heavily calcified aorta and iliac arteries without aneurysm. No retroperitoneal adenopathy or mass   Disc levels: T11-12: Bilateral facet degeneration. Negative for stenosis    L1-2: Advanced disc degeneration with disc space narrowing and mild spurring. Mild facet degeneration. Moderate right foraminal stenosis due to spurring   L1-2: Advanced disc degeneration with disc space narrowing. Diffuse disc bulging. Moderate right foraminal encroachment due to vertebral endplate spurring and facet hypertrophy.   L2-3: Advanced disc degeneration with fusion of the disc space on the right. Mild foraminal stenosis bilaterally.   L3-4: Disc degeneration and spondylosis. Bilateral facet hypertrophy. Severe left foraminal encroachment and mild right foraminal encroachment due to spurring   L4-5: 4 mm anterolisthesis with advanced disc degeneration. Advanced facet hypertrophy bilaterally. Moderate central canal stenosis. Severe subarticular stenosis bilaterally with severe left foraminal encroachment.   L5-S1: 5 mm retrolisthesis with advanced disc degeneration. Diffuse endplate spurring and mild facet hypertrophy. Left-sided facet degeneration and spurring projecting into the spinal canal and likely affecting the left L5 nerve root. Moderate subarticular stenosis and foraminal stenosis bilaterally due to spurring   IMPRESSION: Advanced multilevel degenerative change throughout the lumbar spine with extensive disc space narrowing and spurring. Multilevel foraminal encroachment due to spurring as described above. Moderate central canal stenosis at L4-5.   Atherosclerotic aorta     Electronically Signed   By: Anastasio Balsam M.D.   On: 02/19/2018 13:32  Imaging: No results found.   PMFS History: Patient Active Problem List   Diagnosis Date Noted   Left inguinal hernia 03/23/2023   Aortic atherosclerosis (HCC) 05/03/2022   Parkinsonism (HCC) 02/07/2022   Retinopathy  11/05/2020   Senile purpura (HCC) 04/30/2020   Stage 3a chronic kidney disease (HCC) 04/30/2020   Irritable bowel syndrome with constipation 04/30/2020   Primary osteoarthritis of left  knee 10/03/2019   Primary osteoarthritis of right knee 10/03/2019   Gastroesophageal reflux disease 04/29/2019   Pacemaker 04/21/2017   Complete heart block (HCC) 01/11/2017   History of prostate cancer 12/27/2010   Arthritis 12/27/2010   History of cataract extraction 12/27/2010   HH (hiatus hernia)    History of colonic polyps    Hyperlipidemia LDL goal <130 04/21/2008   Allergic rhinitis 04/21/2008   Essential hypertension 04/18/2008   Past Medical History:  Diagnosis Date   1st degree AV block    Complete Heart block   Aortic atherosclerosis (HCC) 05/03/2022   Arthritis    "hands, back, neck, shoulders, knees" (01/11/2017)   Eczema    GERD (gastroesophageal reflux disease)    HH (hiatus hernia)    History of blood transfusion    "maybe; w/prostate OR; they would have used my own" (01/11/2017)   History of colonic polyps    "I don't think I've had any polyps" (01/11/2017)   Hypercholesterolemia    Hypertension    Presence of permanent cardiac pacemaker    Prostate cancer (HCC) 1997   Seasonal allergies    SVT (supraventricular tachycardia) (HCC)     Family History  Problem Relation Age of Onset   Cancer Mother  Alcoholism Father     Past Surgical History:  Procedure Laterality Date   AV NODE ABLATION  2000s X 2   AVNODE REENTRANT ABLATION   CATARACT EXTRACTION W/ INTRAOCULAR LENS  IMPLANT, BILATERAL     COLONOSCOPY  2007   MAGOD   INGUINAL HERNIA REPAIR Left 03/23/2023   Procedure: LEFT INGUINAL HERNIA REPAIR WITH MESH;  Surgeon: Dareen Ebbing, MD;  Location: [facility];  Service: General;  Laterality: Left;   INSERT / REPLACE / REMOVE PACEMAKER  01/11/2017   PACEMAKER IMPLANT N/A 01/11/2017   Procedure: Pacemaker Implant;  Surgeon: Tammie Fall, MD;  [facility];  Service: Cardiovascular;  Laterality: N/A;   PROSTATECTOMY  1997   SKIN BIOPSY Right 02/04/2019   verruca vulgaris, irritated   TESTICLE SURGERY Left 2010   "benign growth";  [facility]   TONSILLECTOMY     UMBILICAL HERNIA REPAIR N/A 03/23/2023   Procedure: HERNIA REPAIR UMBILICAL ADULT WITH MESH;  Surgeon: Dareen Ebbing, MD;  Location: [facility];  Service: General;  Laterality: N/A;   Social History   Occupational History   Occupation: retired  Tobacco Use   Smoking status: Former    Average packs/day: 1.5 packs/day for 13.0 years (19.5 ttl pk-yrs)    Types: Cigarettes    Start date: 08/25/1962    Quit date: 08/25/1961    Years since quitting: 62.2   Smokeless tobacco: Never  Vaping Use   Vaping status: Never Used  Substance and Sexual Activity   Alcohol use: No   Drug use: No   Sexual activity: Not Currently

## 2023-11-30 NOTE — Progress Notes (Signed)
 Remote pacemaker transmission.

## 2024-01-12 ENCOUNTER — Telehealth: Payer: Self-pay

## 2024-01-12 NOTE — Telephone Encounter (Signed)
 Alcometasone CRE 0.05% is no longer available from [facility]  Pt will need to fill locally @ [facility] on [address] [place] 

## 2024-01-17 ENCOUNTER — Other Ambulatory Visit: Payer: Self-pay

## 2024-01-17 ENCOUNTER — Ambulatory Visit (INDEPENDENT_AMBULATORY_CARE_PROVIDER_SITE_OTHER): Payer: Medicare Other

## 2024-01-17 ENCOUNTER — Telehealth: Payer: Self-pay

## 2024-01-17 ENCOUNTER — Other Ambulatory Visit: Payer: Self-pay | Admitting: Family Medicine

## 2024-01-17 DIAGNOSIS — I442 Atrioventricular block, complete: Secondary | ICD-10-CM | POA: Diagnosis not present

## 2024-01-17 LAB — CUP PACEART REMOTE DEVICE CHECK
Battery Remaining Longevity: 33 mo
Battery Voltage: 2.92 V
Brady Statistic AP VP Percent: 62.67 %
Brady Statistic AP VS Percent: 0.03 %
Brady Statistic AS VP Percent: 36.55 %
Brady Statistic AS VS Percent: 0.76 %
Brady Statistic RA Percent Paced: 62.86 %
Brady Statistic RV Percent Paced: 99.22 %
Date Time Interrogation Session: 20250624190200
Implantable Lead Connection Status: 753985
Implantable Lead Connection Status: 753985
Implantable Lead Implant Date: 20180620
Implantable Lead Implant Date: 20180620
Implantable Lead Location: 753859
Implantable Lead Location: 753860
Implantable Lead Model: 5076
Implantable Lead Model: 5076
Implantable Pulse Generator Implant Date: 20180620
Lead Channel Impedance Value: 304 Ohm
Lead Channel Impedance Value: 342 Ohm
Lead Channel Impedance Value: 380 Ohm
Lead Channel Impedance Value: 399 Ohm
Lead Channel Pacing Threshold Amplitude: 0.5 V
Lead Channel Pacing Threshold Amplitude: 0.75 V
Lead Channel Pacing Threshold Pulse Width: 0.4 ms
Lead Channel Pacing Threshold Pulse Width: 0.4 ms
Lead Channel Sensing Intrinsic Amplitude: 4.625 mV
Lead Channel Sensing Intrinsic Amplitude: 4.625 mV
Lead Channel Sensing Intrinsic Amplitude: 9.125 mV
Lead Channel Sensing Intrinsic Amplitude: 9.125 mV
Lead Channel Setting Pacing Amplitude: 1.5 V
Lead Channel Setting Pacing Amplitude: 2.5 V
Lead Channel Setting Pacing Pulse Width: 0.4 ms
Lead Channel Setting Sensing Sensitivity: 2 mV
Zone Setting Status: 755011
Zone Setting Status: 755011

## 2024-01-17 MED ORDER — ALCLOMETASONE DIPROPIONATE 0.05 % EX CREA: TOPICAL_CREAM | Freq: Two times a day (BID) | CUTANEOUS | 1 refills | Status: DC

## 2024-01-17 NOTE — Telephone Encounter (Signed)
 Copied from CRM [id number]. Topic: Clinical - Medication Refill >> Jan 17, 2024 10:20 AM Santiya F wrote: Medication: alclomethasone (ACLOVATE ) 0.05 % cream [[id number]]  Has the patient contacted their pharmacy? Yes  (Agent: If yes, when and what did the pharmacy advise?) Call office   This is the patient's preferred pharmacy:   [facility] [address] ([address] [place] - [address] [place] ([place]) [place] [address] Phone: [phone] Fax: [phone]  Is this the correct pharmacy for this prescription? Yes If no, delete pharmacy and type the correct one.   Has the prescription been filled recently? Yes  Is the patient out of the medication? Yes  Has the patient been seen for an appointment in the last year OR does the patient have an upcoming appointment? Yes  Can we respond through MyChart? Yes  Agent: Please be advised that Rx refills may take up to 3 business days. We ask that you follow-up with your pharmacy.  Patient is requesting a text or message be sent to him when it's sent to the pharmacy.   ----------------------------------------------------------------------- From previous Reason for Contact - Medication Refill: Medication:   Has the patient contacted their pharmacy?   (Agent: If no, request that the patient contact the pharmacy for the refill. If patient does not wish to contact the pharmacy document the reason why and proceed with request.) (Agent: If yes, when and what did the pharmacy advise?)  This is the patient's preferred pharmacy:  [facility] - [place], [place] - [address] [facility] [address] [place] [address] [place] [address] Phone: [phone] Fax: [phone]  [facility] - [place], [place] - [address] [place] [address] Phone: [phone] Fax: [phone]  Is this the correct pharmacy for this prescription?   If no,  delete pharmacy and type the correct one.   Has the prescription been filled recently?    Is the patient out of the medication?    Has the patient been seen for an appointment in the last year OR does the patient have an upcoming appointment?    Can we respond through MyChart?    Agent: Please be advised that Rx refills may take up to 3 business days. We ask that you follow-up with your pharmacy.   ----------------------------------------------------------------------- From previous Reason for Contact - Medication Refill: Medication: alclomethasone (ACLOVATE ) 0.05 % cream [[id number]]  Has the patient contacted their pharmacy? Yes (Agent: If no, request that the patient contact the pharmacy for the refill. If patient does not wish to contact the pharmacy document the reason why and proceed with request.) (Agent: If yes, when and what did the pharmacy advise?)  This is the patient's preferred pharmacy:  [facility] - [place], [place] - [address] [facility] [address] [place] [address] [place] [address] Phone: [phone] Fax: [phone]  [facility] - [place], [place] - [address] [place]  [address] Phone: [phone] Fax: [phone]  Is this the correct pharmacy for this prescription?   If no, delete pharmacy and type the correct one.   Has the prescription been filled recently?    Is the patient out of the medication?    Has the patient been seen for an appointment in the last year OR does the patient have an upcoming appointment?    Can we  respond through MyChart?    Agent: Please be advised that Rx refills may take up to 3 business days. We ask that you follow-up with your pharmacy.

## 2024-01-17 NOTE — Telephone Encounter (Signed)
 Copied from CRM [id number]. Topic: Clinical - Medication Question >> Jan 17, 2024  3:32 PM Santiya F wrote: Reason for CRM: Patient is calling in checking on the status of his refill request. Patient says he needs the medication as soon as possible. Informed patient of the 3 business day turnaround time and patient expressed he needed it immediately. Please advise.  Talked to patient and send prescriptions to Dr. Joyce to refill.

## 2024-01-25 ENCOUNTER — Encounter: Payer: Self-pay | Admitting: Podiatry

## 2024-01-25 ENCOUNTER — Ambulatory Visit: Payer: Self-pay | Admitting: Podiatry

## 2024-01-25 VITALS — Ht 66.0 in | Wt 141.0 lb

## 2024-01-25 DIAGNOSIS — S90112A Contusion of left great toe without damage to nail, initial encounter: Secondary | ICD-10-CM | POA: Diagnosis not present

## 2024-01-25 NOTE — Progress Notes (Signed)
 Subjective:   Patient ID: [name], male   DOB: 89 y.o.   MRN: [id number]   HPI Patient presents with caregiver with concerns about discoloration of the left hallux medial side that he does not remember injury with.  States that it has just been recent does not hurt just wanted it checked   ROS      Objective:  Physical Exam  Neurovascular status unchanged previous visits with patient having good mental acuity and noted to have discoloration of the left hallux medial side localized in the proximal portion     Assessment:  Probability that this is a blood blister versus any other kind of pathology as its only been around short-term     Plan:  H&P reviewed and I have recommended no current treatment unless it were to expand become painful or start draining.  I do think it is a blood blister and I educated him and caregiver on that fact

## 2024-01-29 ENCOUNTER — Ambulatory Visit: Payer: Self-pay | Admitting: Cardiology

## 2024-02-06 ENCOUNTER — Ambulatory Visit: Admitting: Internal Medicine

## 2024-02-15 ENCOUNTER — Encounter: Payer: Self-pay | Admitting: Internal Medicine

## 2024-02-15 ENCOUNTER — Ambulatory Visit: Admitting: Internal Medicine

## 2024-02-15 VITALS — BP 132/72 | HR 63 | Ht 66.0 in | Wt 132.8 lb

## 2024-02-15 DIAGNOSIS — K5900 Constipation, unspecified: Secondary | ICD-10-CM

## 2024-02-15 DIAGNOSIS — R140 Abdominal distension (gaseous): Secondary | ICD-10-CM

## 2024-02-15 DIAGNOSIS — K581 Irritable bowel syndrome with constipation: Secondary | ICD-10-CM | POA: Diagnosis not present

## 2024-02-15 NOTE — Progress Notes (Signed)
 HISTORY OF PRESENT ILLNESS:  [name] is a 89 y.o. male, former [employer] from [employer], who presents today with his daughter regarding abdominal complaints and bowel habit issues.  Patient has had chronic issues of varying degrees with bloating, increased intestinal gas, and constipation.  He was last evaluated in this office by the GI physician assistant January 05, 2023.  See that dictation.  The patient brings with him today a historical outline of his lower GI issues as well as his regimen throughout.  In short, things are going well until about 5 months ago after having had inguinal and umbilical hernia repair surgery he noticed that his bowels were more difficult to fully evacuate.  This resulted in an uncomfortable sensation.  He goes daily.  No adjustment in his medical regimen.  Less exercise (bike riding) than prior to surgery.  No other issues.  His daughter mentions weight loss over time.  He is down about 10 pounds from last year.  No bleeding or other issues.  He takes 1 dose of MiraLAX daily.  He has a modified diet.  He wonders about the new inguinal hernia on the right.  REVIEW OF SYSTEMS:  All non-GI ROS negative. Past Medical History:  Diagnosis Date   1st degree AV block    Complete Heart block   Aortic atherosclerosis (HCC) 05/03/2022   Arthritis    hands, back, neck, shoulders, knees (01/11/2017)   Eczema    GERD (gastroesophageal reflux disease)    HH (hiatus hernia)    History of blood transfusion    maybe; w/prostate OR; they would have used my own (01/11/2017)   History of colonic polyps    I don't think I've had any polyps (01/11/2017)   Hypercholesterolemia    Hypertension    Presence of permanent cardiac pacemaker    Prostate cancer (HCC) 1997   Seasonal allergies    SVT (supraventricular tachycardia) (HCC)     Past Surgical History:  Procedure Laterality Date   AV NODE ABLATION  2000s X 2   AVNODE REENTRANT ABLATION    CATARACT EXTRACTION W/ INTRAOCULAR LENS  IMPLANT, BILATERAL     COLONOSCOPY  2007   MAGOD   INGUINAL HERNIA REPAIR Left 03/23/2023   Procedure: LEFT INGUINAL HERNIA REPAIR WITH MESH;  Surgeon: Belinda Cough, MD;  Location: [facility];  Service: General;  Laterality: Left;   INSERT / REPLACE / REMOVE PACEMAKER  01/11/2017   PACEMAKER IMPLANT N/A 01/11/2017   Procedure: Pacemaker Implant;  Surgeon: Waddell Danelle ORN, MD;  [facility]: [facility];  Service: Cardiovascular;  Laterality: N/A;   PROSTATECTOMY  1997   SKIN BIOPSY Right 02/04/2019   verruca vulgaris, irritated   TESTICLE SURGERY Left 2010   benign growth; [facility]   TONSILLECTOMY     UMBILICAL HERNIA REPAIR N/A 03/23/2023   Procedure: HERNIA REPAIR UMBILICAL ADULT WITH MESH;  Surgeon: Belinda Cough, MD;  Location: [facility];  Service: General;  Laterality: N/A;    Social History [name]  reports that he quit smoking about 62 years ago. His smoking use included cigarettes. He started smoking about 61 years ago. He has a 19.5 pack-year smoking history. He has never used smokeless tobacco. He reports that he does not drink alcohol and does not use drugs.  family history includes Alcoholism in his father; Cancer in his mother.  Allergies  Allergen Reactions   Brompheniramine-Pseudoeph Other (See Comments)    Makes pt use the bathroom frequently.  Codeine Nausea And Vomiting       PHYSICAL EXAMINATION: Vital signs: BP 132/72   Pulse 63   Ht 5' 6 (1.676 m)   Wt 132 lb 12.8 oz (60.2 kg)   BMI 21.43 kg/m   Constitutional: Spry and generally well-appearing 89 year old, no acute distress Psychiatric: alert and oriented x3, cooperative Eyes: extraocular movements intact, anicteric, conjunctiva pink Mouth: oral pharynx moist, no lesions Neck: A bit kyphotic, no lymphadenopathy Cardiovascular: heart regular rate and rhythm, no murmur Lungs: clear to auscultation bilaterally Abdomen: soft, nontender,  nondistended, no obvious ascites, no peritoneal signs, normal bowel sounds, no organomegaly Rectal omitted Extremities: no clubbing, cyanosis, or lower extremity edema bilaterally Skin: no lesions on visible extremities Neuro: No focal deficits.  Cranial nerves intact  ASSESSMENT:  1.  Chronic bloating and constipation.  Change in bowel habits after hernia surgeries as described.   PLAN:  1.  Recommend increasing MiraLAX from 1 capful to 1.5 capfuls daily.  May adjust further if needed 2.  Follow-up as needed Total time of 30 minutes was spent preparing to see the patient, obtaining comprehensive history, performing medically appropriate physical exam, counseling and educating the patient and his daughter regarding the above issues, answering questions, directing medical therapies, and documenting clinical information in the health record

## 2024-02-15 NOTE — Patient Instructions (Signed)
 Please follow up as needed.  _______________________________________________________  If your blood pressure at your visit was 140/90 or greater, please contact your primary care physician to follow up on this.  _______________________________________________________  If you are age 88 or older, your body mass index should be between 23-30. Your Body mass index is 21.43 kg/m. If this is out of the aforementioned range listed, please consider follow up with your Primary Care Provider.  If you are age 72 or younger, your body mass index should be between 19-25. Your Body mass index is 21.43 kg/m. If this is out of the aformentioned range listed, please consider follow up with your Primary Care Provider.   ________________________________________________________  The  GI providers would like to encourage you to use MYCHART to communicate with providers for non-urgent requests or questions.  Due to long hold times on the telephone, sending your provider a message by [facility] may be a faster and more efficient way to get a response.  Please allow 48 business hours for a response.  Please remember that this is for non-urgent requests.  _______________________________________________________  [facility] is using a team-based approach to care.  Your team is made up of your doctor and two to three APPS. Our APPS (Nurse Practitioners and Physician Assistants) work with your physician to ensure care continuity for you. They are fully qualified to address your health concerns and develop a treatment plan. They communicate directly with your gastroenterologist to care for you. Seeing the Advanced Practice Practitioners on your physician's team can help you by facilitating care more promptly, often allowing for earlier appointments, access to diagnostic testing, procedures, and other specialty referrals.

## 2024-02-16 ENCOUNTER — Other Ambulatory Visit: Payer: Self-pay | Admitting: Family Medicine

## 2024-02-16 DIAGNOSIS — I10 Essential (primary) hypertension: Secondary | ICD-10-CM

## 2024-03-04 ENCOUNTER — Other Ambulatory Visit: Payer: Self-pay | Admitting: Family Medicine

## 2024-03-04 DIAGNOSIS — K449 Diaphragmatic hernia without obstruction or gangrene: Secondary | ICD-10-CM

## 2024-03-12 ENCOUNTER — Telehealth: Payer: Self-pay | Admitting: Podiatry

## 2024-03-12 NOTE — Telephone Encounter (Signed)
 Patient called in asking for return call in regards to his pain in great left toe. Tried to get further information as to what it was in regards to but patient just states that sometimes he is having pain the pain and other times he is not.

## 2024-03-20 ENCOUNTER — Telehealth: Payer: Self-pay | Admitting: Lab

## 2024-03-20 NOTE — Telephone Encounter (Signed)
 Patient would like provider to call in regards to his pain on left foot.

## 2024-03-22 NOTE — Telephone Encounter (Signed)
 I don't have anything I can add to his case. If he is having pain he needs to make appointment

## 2024-03-22 NOTE — Telephone Encounter (Signed)
 Left detailed message.

## 2024-03-25 ENCOUNTER — Other Ambulatory Visit: Payer: Self-pay | Admitting: Family Medicine

## 2024-03-25 DIAGNOSIS — I700 Atherosclerosis of aorta: Secondary | ICD-10-CM

## 2024-04-03 ENCOUNTER — Telehealth: Payer: Self-pay

## 2024-04-03 NOTE — Telephone Encounter (Signed)
 Pt. Aware that prescription has been faxed to his requested pharmacy.   Copied from CRM [id number]. Topic: Clinical - Medical Advice >> Apr 03, 2024 10:08 AM [name] wrote: Reason for CRM: Patient called in regarding a covid prescription being sent to the pharmacy

## 2024-04-10 ENCOUNTER — Encounter: Payer: Self-pay | Admitting: Internal Medicine

## 2024-04-10 NOTE — Progress Notes (Signed)
 Remote PPM Transmission

## 2024-04-12 DIAGNOSIS — H348122 Central retinal vein occlusion, left eye, stable: Secondary | ICD-10-CM | POA: Diagnosis not present

## 2024-04-12 DIAGNOSIS — H40013 Open angle with borderline findings, low risk, bilateral: Secondary | ICD-10-CM | POA: Diagnosis not present

## 2024-04-12 DIAGNOSIS — D3131 Benign neoplasm of right choroid: Secondary | ICD-10-CM | POA: Diagnosis not present

## 2024-04-12 DIAGNOSIS — H35371 Puckering of macula, right eye: Secondary | ICD-10-CM | POA: Diagnosis not present

## 2024-04-12 DIAGNOSIS — H524 Presbyopia: Secondary | ICD-10-CM | POA: Diagnosis not present

## 2024-04-17 ENCOUNTER — Ambulatory Visit: Payer: Medicare Other

## 2024-04-17 DIAGNOSIS — I442 Atrioventricular block, complete: Secondary | ICD-10-CM | POA: Diagnosis not present

## 2024-04-18 LAB — CUP PACEART REMOTE DEVICE CHECK
Battery Remaining Longevity: 31 mo
Battery Voltage: 2.91 V
Brady Statistic AP VP Percent: 65.05 %
Brady Statistic AP VS Percent: 0 %
Brady Statistic AS VP Percent: 34.65 %
Brady Statistic AS VS Percent: 0.29 %
Brady Statistic RA Percent Paced: 65.12 %
Brady Statistic RV Percent Paced: 99.7 %
Date Time Interrogation Session: 20250923192547
Implantable Lead Connection Status: 753985
Implantable Lead Connection Status: 753985
Implantable Lead Implant Date: 20180620
Implantable Lead Implant Date: 20180620
Implantable Lead Location: 753859
Implantable Lead Location: 753860
Implantable Lead Model: 5076
Implantable Lead Model: 5076
Implantable Pulse Generator Implant Date: 20180620
Lead Channel Impedance Value: 266 Ohm
Lead Channel Impedance Value: 323 Ohm
Lead Channel Impedance Value: 361 Ohm
Lead Channel Impedance Value: 361 Ohm
Lead Channel Pacing Threshold Amplitude: 0.5 V
Lead Channel Pacing Threshold Amplitude: 0.75 V
Lead Channel Pacing Threshold Pulse Width: 0.4 ms
Lead Channel Pacing Threshold Pulse Width: 0.4 ms
Lead Channel Sensing Intrinsic Amplitude: 3.25 mV
Lead Channel Sensing Intrinsic Amplitude: 3.25 mV
Lead Channel Sensing Intrinsic Amplitude: 4.125 mV
Lead Channel Sensing Intrinsic Amplitude: 4.125 mV
Lead Channel Setting Pacing Amplitude: 1.5 V
Lead Channel Setting Pacing Amplitude: 2.5 V
Lead Channel Setting Pacing Pulse Width: 0.4 ms
Lead Channel Setting Sensing Sensitivity: 2 mV
Zone Setting Status: 755011
Zone Setting Status: 755011

## 2024-04-19 NOTE — Progress Notes (Signed)
 Remote PPM Transmission

## 2024-04-30 ENCOUNTER — Ambulatory Visit: Payer: Self-pay | Admitting: Cardiovascular Disease

## 2024-05-27 ENCOUNTER — Encounter: Payer: Self-pay | Admitting: Radiology

## 2024-06-10 ENCOUNTER — Other Ambulatory Visit: Payer: Self-pay

## 2024-06-10 ENCOUNTER — Other Ambulatory Visit: Payer: Self-pay | Admitting: Family Medicine

## 2024-06-10 MED ORDER — TRIAMCINOLONE ACETONIDE 0.025 % EX CREA: 1.0000 | TOPICAL_CREAM | Freq: Two times a day (BID) | CUTANEOUS | 0 refills | Status: AC

## 2024-06-10 NOTE — Telephone Encounter (Signed)
 Copied from CRM #[id number]. Topic: Clinical - Medication Question >> Jun 10, 2024 10:35 AM Franky GRADE wrote: Reason for CRM: Patient is calling to make sure the prescription for  triamcinolone  (KENALOG ) 0.025 % cream [[id number]] be sent to [facility] - [place] ([place]), [place] - [address]  Phone: [phone] Fax: [phone] instead of [facility].

## 2024-06-12 ENCOUNTER — Telehealth: Payer: Self-pay | Admitting: Internal Medicine

## 2024-06-12 NOTE — Telephone Encounter (Unsigned)
 Copied from CRM #[id number]. Topic: Clinical - Prescription Issue >> Jun 12, 2024  3:40 PM Shereese L wrote: Reason for CRM: Patient stated that the wrong medication triamcinolone  (KENALOG ) 0.025 % cream was prescribed. He stated that he should have received alclometasone  dipropionate and wanted to know why it was changed  and what's the difference.Patient is requesting a call back

## 2024-06-13 MED ORDER — ALCLOMETASONE DIPROPIONATE 0.05 % EX CREA: TOPICAL_CREAM | Freq: Two times a day (BID) | CUTANEOUS | 1 refills | Status: AC

## 2024-07-11 ENCOUNTER — Ambulatory Visit: Payer: Medicare Other | Admitting: Family Medicine

## 2024-07-11 ENCOUNTER — Encounter: Payer: Self-pay | Admitting: Family Medicine

## 2024-07-11 VITALS — BP 122/84 | HR 69 | Ht 64.0 in | Wt 131.9 lb

## 2024-07-11 DIAGNOSIS — K469 Unspecified abdominal hernia without obstruction or gangrene: Secondary | ICD-10-CM | POA: Diagnosis not present

## 2024-07-11 DIAGNOSIS — E785 Hyperlipidemia, unspecified: Secondary | ICD-10-CM

## 2024-07-11 DIAGNOSIS — L602 Onychogryphosis: Secondary | ICD-10-CM

## 2024-07-11 DIAGNOSIS — I700 Atherosclerosis of aorta: Secondary | ICD-10-CM | POA: Diagnosis not present

## 2024-07-11 DIAGNOSIS — H6123 Impacted cerumen, bilateral: Secondary | ICD-10-CM

## 2024-07-11 DIAGNOSIS — Z0000 Encounter for general adult medical examination without abnormal findings: Secondary | ICD-10-CM

## 2024-07-11 LAB — CBC WITH DIFFERENTIAL/PLATELET
Basophils Absolute: 0 x10E3/uL (ref 0.0–0.2)
Basos: 0 %
EOS (ABSOLUTE): 0.1 x10E3/uL (ref 0.0–0.4)
Eos: 2 %
Hematocrit: 44 % (ref 37.5–51.0)
Hemoglobin: 14.8 g/dL (ref 13.0–17.7)
Immature Grans (Abs): 0 x10E3/uL (ref 0.0–0.1)
Immature Granulocytes: 0 %
Lymphocytes Absolute: 0.4 x10E3/uL — ABNORMAL LOW (ref 0.7–3.1)
Lymphs: 7 %
MCH: 31.8 pg (ref 26.6–33.0)
MCHC: 33.6 g/dL (ref 31.5–35.7)
MCV: 94 fL (ref 79–97)
Monocytes Absolute: 0.5 x10E3/uL (ref 0.1–0.9)
Monocytes: 8 %
Neutrophils Absolute: 5.4 x10E3/uL (ref 1.4–7.0)
Neutrophils: 83 %
Platelets: 159 x10E3/uL (ref 150–450)
RBC: 4.66 x10E6/uL (ref 4.14–5.80)
RDW: 11.9 % (ref 11.6–15.4)
WBC: 6.5 x10E3/uL (ref 3.4–10.8)

## 2024-07-11 LAB — COMPREHENSIVE METABOLIC PANEL WITH GFR
ALT: 20 IU/L (ref 0–44)
AST: 28 IU/L (ref 0–40)
Albumin: 4.6 g/dL (ref 3.6–4.6)
Alkaline Phosphatase: 101 IU/L (ref 48–129)
BUN/Creatinine Ratio: 14 (ref 10–24)
BUN: 13 mg/dL (ref 10–36)
Bilirubin Total: 0.5 mg/dL (ref 0.0–1.2)
CO2: 25 mmol/L (ref 20–29)
Calcium: 10.1 mg/dL (ref 8.6–10.2)
Chloride: 90 mmol/L — ABNORMAL LOW (ref 96–106)
Creatinine, Ser: 0.9 mg/dL (ref 0.76–1.27)
Globulin, Total: 2.2 g/dL (ref 1.5–4.5)
Glucose: 94 mg/dL (ref 70–99)
Potassium: 3.7 mmol/L (ref 3.5–5.2)
Sodium: 131 mmol/L — ABNORMAL LOW (ref 134–144)
Total Protein: 6.8 g/dL (ref 6.0–8.5)
eGFR: 81 mL/min/1.73 (ref >59)

## 2024-07-11 MED ORDER — ROSUVASTATIN CALCIUM 20 MG PO TABS: 20.0000 mg | ORAL_TABLET | Freq: Every day | ORAL | 1 refills | Status: AC

## 2024-07-11 NOTE — Addendum Note (Signed)
 Addended by: Aldan Camey on: 07/11/2024 10:42 AM   Modules accepted: Level of Service

## 2024-07-11 NOTE — Progress Notes (Signed)
 No chief complaint on file.    Subjective:   [name] is a 89 y.o. male who presents for a Medicare Annual Wellness Visit.  Visit info / Clinical Intake: Medicare Wellness Visit Type:: Subsequent Annual Wellness Visit Persons participating in visit and providing information:: patient Medicare Wellness Visit Mode:: In-person (required for WTM) Interpreter Needed?: No Pre-visit prep was completed: no AWV questionnaire completed by patient prior to visit?: no Living arrangements:: lives with spouse/significant other Patient's Overall Health Status Rating: good Typical amount of pain: none Does pain affect daily life?: no Are you currently prescribed opioids?: no  Dietary Habits and Nutritional Risks How many meals a day?: 3 Eats fruit and vegetables daily?: yes Most meals are obtained by: having others provide food; eating out; preparing own meals In the last 2 weeks, have you had any of the following?: none Diabetic:: no  Functional Status Activities of Daily Living (to include ambulation/medication): Independent Ambulation: Independent Medication Administration: Independent Home Management (perform basic housework or laundry): Independent Manage your own finances?: yes Primary transportation is: driving Concerns about vision?: no *vision screening is required for WTM* Concerns about hearing?: no  Fall Screening Falls in the past year?: 0 Number of falls in past year: 0 Was there an injury with Fall?: 0 Fall Risk Category Calculator: 0 Patient Fall Risk Level: Low Fall Risk  Fall Risk Patient at Risk for Falls Due to: No Fall Risks Fall risk Follow up: Falls evaluation completed  Home and Transportation Safety: All rugs have non-skid backing?: yes All stairs or steps have railings?: yes Grab bars in the bathtub or shower?: yes Have non-skid surface in bathtub or shower?: yes Good home lighting?: yes Regular seat belt use?: yes Hospital stays in the last  year:: no  Cognitive Assessment Difficulty concentrating, remembering, or making decisions? : yes Will 6CIT or Mini Cog be Completed: yes What year is it?: 0 points What month is it?: 0 points Give patient an address phrase to remember (5 components): [address] About what time is it?: 0 points Count backwards from 20 to 1: 0 points Say the months of the year in reverse: 0 points Repeat the address phrase from earlier: 0 points 6 CIT Score: 0 points  Advance Directives (For Healthcare) Does Patient Have a Medical Advance Directive?: Yes Does patient want to make changes to medical advance directive?: No - Patient declined Type of Advance Directive: Healthcare Power of [place]; Living will; Out of facility DNR (pink MOST or yellow form) Copy of Healthcare Power of Attorney in Chart?: Yes - validated most recent copy scanned in chart (See row information) Copy of Living Will in Chart?: Yes - validated most recent copy scanned in chart (See row information) Out of facility DNR (pink MOST or yellow form) in Chart? (Ambulatory ONLY): Yes - validated most recent copy scanned in chart  Reviewed/Updated  Reviewed/Updated: Reviewed All (Medical, Surgical, Family, Medications, Allergies, Care Teams, Patient Goals)    Allergies (verified) Brompheniramine-pseudoeph and Codeine   Current Medications (verified) Outpatient Encounter Medications as of 07/11/2024  Medication Sig   acetaminophen  (TYLENOL ) 500 MG tablet Take 500-1,000 mg by mouth every 6 (six) hours as needed for moderate pain or mild pain.   alclomethasone (ACLOVATE ) 0.05 % cream Apply topically 2 (two) times daily.   amLODipine  (NORVASC ) 5 MG tablet TAKE 1 TABLET BY MOUTH DAILY   cholecalciferol (VITAMIN D3) 25 MCG (1000 UNIT) tablet Take 1 tablet (1,000 Units total) by mouth daily.   dextromethorphan-guaiFENesin  (  MUCINEX  DM) 30-600 MG 12hr tablet Take 1 tablet by mouth 2 (two) times daily.   hydrochlorothiazide  (HYDRODIURIL )  12.5 MG tablet TAKE 1 TABLET BY MOUTH DAILY   loratadine  (CLARITIN ) 10 MG tablet Take 10 mg by mouth daily.   omeprazole  (PRILOSEC) 20 MG capsule TAKE 1 CAPSULE BY MOUTH TWICE  DAILY BEFORE MEALS   polyethylene glycol (MIRALAX / GLYCOLAX) 17 g packet Take 17 g by mouth daily.   triamcinolone  (KENALOG ) 0.025 % cream Apply 1 Application topically 2 (two) times daily.   [DISCONTINUED] rosuvastatin  (CRESTOR ) 20 MG tablet TAKE 1 TABLET BY MOUTH DAILY   brompheniramine-pseudoephedrine-DM 30-2-10 MG/5ML syrup Take 5 mLs by mouth 4 (four) times daily as needed (congestion, drainage, cough). (Patient not taking: Reported on 07/11/2024)   rosuvastatin  (CRESTOR ) 20 MG tablet Take 1 tablet (20 mg total) by mouth daily.   No facility-administered encounter medications on file as of 07/11/2024.    History: Past Medical History:  Diagnosis Date   1st degree AV block    Complete Heart block   Aortic atherosclerosis 05/03/2022   Arthritis    hands, back, neck, shoulders, knees (01/11/2017)   Eczema    GERD (gastroesophageal reflux disease)    HH (hiatus hernia)    History of blood transfusion    maybe; w/prostate OR; they would have used my own (01/11/2017)   History of colonic polyps    I don't think I've had any polyps (01/11/2017)   Hypercholesterolemia    Hypertension    Presence of permanent cardiac pacemaker    Prostate cancer (HCC) 1997   Seasonal allergies    SVT (supraventricular tachycardia)    Past Surgical History:  Procedure Laterality Date   AV NODE ABLATION  2000s X 2   AVNODE REENTRANT ABLATION   CATARACT EXTRACTION W/ INTRAOCULAR LENS  IMPLANT, BILATERAL     COLONOSCOPY  2007   MAGOD   INGUINAL HERNIA REPAIR Left 03/23/2023   Procedure: LEFT INGUINAL HERNIA REPAIR WITH MESH;  Surgeon: Belinda Cough, MD;  Location: [facility];  Service: General;  Laterality: Left;   INSERT / REPLACE / REMOVE PACEMAKER  01/11/2017   PACEMAKER IMPLANT N/A 01/11/2017   Procedure: Pacemaker  Implant;  Surgeon: Waddell Danelle ORN, MD;  Location: [facility];  Service: Cardiovascular;  Laterality: N/A;   PROSTATECTOMY  1997   SKIN BIOPSY Right 02/04/2019   verruca vulgaris, irritated   TESTICLE SURGERY Left 2010   benign growth; [facility]   TONSILLECTOMY     UMBILICAL HERNIA REPAIR N/A 03/23/2023   Procedure: HERNIA REPAIR UMBILICAL ADULT WITH MESH;  Surgeon: Belinda Cough, MD;  Location: [facility];  Service: General;  Laterality: N/A;   Family History  Problem Relation Age of Onset   Cancer Mother    Alcoholism Father    Social History   Occupational History   Occupation: retired  Tobacco Use   Smoking status: Former    Average packs/day: 1.5 packs/day for 13.0 years (19.5 ttl pk-yrs)    Types: Cigarettes    Start date: 08/25/1962    Quit date: 08/25/1961    Years since quitting: 62.9   Smokeless tobacco: Never  Vaping Use   Vaping status: Never Used  Substance and Sexual Activity   Alcohol use: No   Drug use: No   Sexual activity: Not Currently   Tobacco Counseling Counseling given: Not Answered  SDOH Screenings   Food Insecurity: No Food Insecurity (07/11/2024)  Housing: Low Risk (07/11/2024)  Transportation Needs: No Transportation  Needs (07/11/2024)  Utilities: Not At Risk (07/11/2024)  Alcohol Screen: Low Risk (05/23/2023)  Depression (PHQ2-9): Low Risk (07/11/2024)  Financial Resource Strain: Low Risk (05/23/2023)  Physical Activity: Insufficiently Active (07/11/2024)  Social Connections: Moderately Isolated (07/11/2024)  Stress: No Stress Concern Present (07/11/2024)  Tobacco Use: Medium Risk (07/11/2024)  Health Literacy: Adequate Health Literacy (07/11/2024)   See flowsheets for full screening details  Depression Screen PHQ 2 & 9 Depression Scale- Over the past 2 weeks, how often have you been bothered by any of the following problems? Little interest or pleasure in doing things: 0 Feeling down, depressed, or hopeless (PHQ Adolescent also  includes...irritable): 0 PHQ-2 Total Score: 0     Goals Addressed             This Visit's Progress    Patient Stated       Stay alive             Objective:    Today's Vitals   07/11/24 0940  BP: 122/84  Pulse: 69  SpO2: 99%  Weight: 131 lb 14.4 oz (59.8 kg)  Height: 5' 4 (1.626 m)   Body mass index is 22.64 kg/m.  Hearing/Vision screen No results found. Immunizations and Health Maintenance Health Maintenance  Topic Date Due   DTaP/Tdap/Td (3 - Td or Tdap) 01/02/2022   Influenza Vaccine  Never done   COVID-19 Vaccine (7 - 2025-26 season) 03/25/2024   Medicare Annual Wellness (AWV)  07/11/2025   Pneumococcal Vaccine: 50+ Years  Completed   Zoster Vaccines- Shingrix  Completed   Meningococcal B Vaccine  Aged Out        Assessment/Plan:  This is a routine wellness examination for [place].  Patient Care Team: Kiegan Macaraeg, MD as PCP - General (Family Medicine) Waddell Danelle ORN, MD as PCP - Electrophysiology (Cardiology) [facility], [facility]  I have personally reviewed and noted the following in the patients chart:   Medical and social history Use of alcohol, tobacco or illicit drugs  Current medications and supplements including opioid prescriptions. Functional ability and status Nutritional status Physical activity Advanced directives List of other physicians Hospitalizations, surgeries, and ER visits in previous 12 months Vitals Screenings to include cognitive, depression, and falls Referrals and appointments  Orders Placed This Encounter  Procedures   CBC with Differential   Comprehensive metabolic panel   Ambulatory referral to Podiatry    Referral Priority:   Routine    Referral Type:   Consultation    Referral Reason:   Specialty Services Required    Requested Specialty:   Podiatry    Number of Visits Requested:   1   In addition, I have reviewed and discussed with patient certain preventive protocols, quality metrics, and best  practice recommendations. A written personalized care plan for preventive services as well as general preventive health recommendations were provided to patient. >=15 minutes spent in face-to-face counseling focused on reducing cardiovascular risk.  Topics reviewed: - Assessment of individual CVD risk factors (BP, lipids, glucose, weight, activity level) - Lifestyle modifications including heart-healthy diet, sodium reduction, and increased physical activity - Smoking cessation and alcohol moderation when applicable - Importance of blood pressure control, lipid optimization, and medication adherence - Personalized risk-reduction plan discussed and agreed upon   Zyrion Coey A Ran Tullis, MD   07/11/2024   No follow-ups on file.  After Visit Summary: (In Person-Printed) AVS printed and given to the patient  Nurse Notes: none

## 2024-07-11 NOTE — Patient Instructions (Signed)
 Please increase your protein intake, try and get atleast 50g of protein a day.   Drinks like protein shakes, ensure, and boost are great to ways to get this  Please start taking creatine monohydrate  Please start taking magnesium glycinate

## 2024-07-11 NOTE — Progress Notes (Signed)
 Name: [name]   Date of Visit: 07/11/2024   Date of last visit with me: Visit date not found   CHIEF COMPLAINT: No chief complaint on file.      HPI:  Discussed the use of AI scribe software for clinical note transcription with the patient, who gave verbal consent to proceed.  History of Present Illness   [name] is a 89 year old male who presents for an annual physical exam.  He has an issue with his toe that has not healed, located at the top near the nail. The symptoms are intermittent and not constantly bothersome. He has tried soaking it in salt water occasionally and previously consulted a podiatrist but found the visit unhelpful.  He mentions having a hernia on the left side that occasionally protrudes but resolves on its own. He had a previous hernia operation on the left side. He reports intermittent hernia symptoms.  He has a history of irritable bowel syndrome diagnosed in 2018, leading to dietary modifications. He avoids bread and foods that aggravate his stomach. He takes Miralax daily for constipation and maintains a water intake of eight ounces eight times a day.  He experiences hearing difficulties and has hearing aids but does not wear them.  His right leg is red and swollen and has been this way for several years.  His weight has decreased from 155 pounds in 2018 to 130 pounds currently. He reports stable weight with a decrease since 2018.  He takes omeprazole  and a pill described as an orange pill, which he takes at lunchtime.         OBJECTIVE:       07/11/2024    9:37 AM  Depression screen PHQ 2/9  Decreased Interest 0  Down, Depressed, Hopeless 0  PHQ - 2 Score 0     BP Readings from Last 3 Encounters:  07/11/24 122/84  02/15/24 132/72  05/24/23 136/70    BP 122/84   Pulse 69   Ht 5' 4 (1.626 m)   Wt 131 lb 14.4 oz (59.8 kg)   SpO2 99%   BMI 22.64 kg/m    Physical Exam   MEASUREMENTS: Weight- 130. HEENT: Cerumen  present in the left ear with clogging, wax is soft. EXTREMITIES: Toenail thickened, joint space loss at distal toe, arthritic changes present.      Physical Exam  ASSESSMENT/PLAN:   Assessment & Plan Encounter for Medicare annual wellness exam  Annual physical exam  Thickened nails  Aortic atherosclerosis  Non-recurrent abdominal hernia without obstruction or gangrene, unspecified hernia type  Bilateral impacted cerumen  Hyperlipidemia, unspecified hyperlipidemia type    Assessment and Plan    Onychogryphosis with toe osteoarthritis Chronic toenail thickening with mild distal interphalangeal joint arthritis causing intermittent discomfort. - Referred to a different podiatrist for toenail trimming and management.  Non-recurrent abdominal hernia Intermittent left-sided hernia with low risk of incarceration, no current pain or constant protrusion. - Monitor for changes such as constant protrusion or pain and report if these occur.  Bilateral impacted cerumen Cerumen impaction in left ear causing hearing difficulties. - Performed ear irrigation to remove impacted cerumen.  Irritable bowel syndrome with constipation Chronic IBS with constipation managed with Miralax and dietary modifications. - Continue Miralax. - Maintain dietary modifications and hydration regimen.  Chronic venous insufficiency of right leg Stable chronic venous insufficiency with no acute changes. - Continue current management.  Hyperlipidemia Well-controlled with current medication regimen. - Continue current medication regimen.  General Health Maintenance Discussed protein intake for muscle mass maintenance and supplements for cardiovascular and muscle health. - Ensure protein intake of at least 50 grams per day through diet or supplements. - Consider magnesium glycinate and creatine monohydrate supplements.  -Comprehensive annual physical exam completed today. Reviewed interval history, current  medical issues, medications, allergies, and preventive care needs. Addressed all patient questions and concerns. Discussed lifestyle factors including diet, exercise, sleep, and stress management. Reviewed recommended age-appropriate screenings, labs, and vaccinations. Counseling provided on healthy habits and routine health maintenance. Follow-up as indicated based on findings and results.         Brittyn Salaz A. Vita MD [facility]

## 2024-07-12 ENCOUNTER — Ambulatory Visit: Payer: Self-pay | Admitting: Family Medicine

## 2024-07-12 ENCOUNTER — Telehealth: Payer: Self-pay

## 2024-07-12 NOTE — Telephone Encounter (Signed)
 Copied from CRM #[id number]. Topic: General - Other >> Jul 11, 2024  2:23 PM [name] wrote: Reason for CRM: Patient requests call back, or mychart message and has  questions concerning the  hernia ,bi lateral impacted cerumen, hyperlipidemia  unspecified type, put in paperwork in visits.

## 2024-07-15 NOTE — Telephone Encounter (Signed)
 Has a hernia on his right abdomin, could not show because it is not happening at the moment. Wants to know what his course of action would be if it was to come back and stay.

## 2024-07-19 ENCOUNTER — Ambulatory Visit: Payer: Medicare Other

## 2024-07-19 DIAGNOSIS — I442 Atrioventricular block, complete: Secondary | ICD-10-CM | POA: Diagnosis not present

## 2024-07-21 LAB — CUP PACEART REMOTE DEVICE CHECK
Battery Remaining Longevity: 29 mo
Battery Voltage: 2.9 V
Brady Statistic AP VP Percent: 60.29 %
Brady Statistic AP VS Percent: 0.01 %
Brady Statistic AS VP Percent: 39.42 %
Brady Statistic AS VS Percent: 0.29 %
Brady Statistic RA Percent Paced: 60.32 %
Brady Statistic RV Percent Paced: 99.71 %
Date Time Interrogation Session: 20251225204940
Implantable Lead Connection Status: 753985
Implantable Lead Connection Status: 753985
Implantable Lead Implant Date: 20180620
Implantable Lead Implant Date: 20180620
Implantable Lead Location: 753859
Implantable Lead Location: 753860
Implantable Lead Model: 5076
Implantable Lead Model: 5076
Implantable Pulse Generator Implant Date: 20180620
Lead Channel Impedance Value: 266 Ohm
Lead Channel Impedance Value: 304 Ohm
Lead Channel Impedance Value: 361 Ohm
Lead Channel Impedance Value: 361 Ohm
Lead Channel Pacing Threshold Amplitude: 0.5 V
Lead Channel Pacing Threshold Amplitude: 0.875 V
Lead Channel Pacing Threshold Pulse Width: 0.4 ms
Lead Channel Pacing Threshold Pulse Width: 0.4 ms
Lead Channel Sensing Intrinsic Amplitude: 3.875 mV
Lead Channel Sensing Intrinsic Amplitude: 3.875 mV
Lead Channel Sensing Intrinsic Amplitude: 4.375 mV
Lead Channel Sensing Intrinsic Amplitude: 4.375 mV
Lead Channel Setting Pacing Amplitude: 1.5 V
Lead Channel Setting Pacing Amplitude: 2.5 V
Lead Channel Setting Pacing Pulse Width: 0.4 ms
Lead Channel Setting Sensing Sensitivity: 2 mV
Zone Setting Status: 755011
Zone Setting Status: 755011

## 2024-07-22 NOTE — Progress Notes (Signed)
 Remote PPM Transmission

## 2024-07-24 ENCOUNTER — Ambulatory Visit: Payer: Self-pay | Admitting: Cardiovascular Disease

## 2024-08-30 ENCOUNTER — Ambulatory Visit: Payer: Self-pay

## 2024-08-30 NOTE — Telephone Encounter (Signed)
 FYI Only or Action Required?: FYI only for provider: appointment scheduled on 09/02/24. Pt asked for appt on Tuesday, offered appt for Monday and pt agreeable.   Patient was last seen in primary care on 07/11/2024 by Vita Morrow, MD.  Called Nurse Triage reporting Leg Swelling.  Symptoms began a week ago.  Interventions attempted: Rest, hydration, or home remedies.  Symptoms are: stable.  Triage Disposition: See PCP When Office is Open (Within 3 Days) (overriding See Physician Within 24 Hours)  Patient/caregiver understands and will follow disposition?: Yes  Reason for Triage: Pt called in stating that his right leg is red and swollen. Pt stated that his right leg looks bigger than his left leg. Denied having any other symptoms. Warm transferred to nurse triage.  Reason for Disposition  [1] MODERATE leg swelling (e.g., swelling extends up to knees) AND [2] new-onset or getting worse  Answer Assessment - Initial Assessment Questions 1. ONSET: When did the swelling start? (e.g., minutes, hours, days)     1 week 2. LOCATION: What part of the leg is swollen?  Are both legs swollen or just one leg?     R leg from ankle 6-8 above  3. SEVERITY: How bad is the swelling? (e.g., localized; mild, moderate, severe)     moderate 4. REDNESS: Is there redness or signs of infection?     yes 5. PAIN: Is the swelling painful to touch? If Yes, ask: How painful is it?   (Scale 1-10; mild, moderate or severe)     no 9. RECURRENT SYMPTOM: Have you had leg swelling before? If Yes, ask: When was the last time? What happened that time?     Ongoing issue 10. OTHER SYMPTOMS: Do you have any other symptoms? (e.g., chest pain, difficulty breathing)  Protocols used: Leg Swelling and Edema-A-AH

## 2024-08-30 NOTE — Telephone Encounter (Signed)
 Contacted patient to see if he wants to come today versus Monday. Left message for pt to call us  back

## 2024-09-02 ENCOUNTER — Ambulatory Visit: Admitting: Family Medicine

## 2024-10-18 ENCOUNTER — Ambulatory Visit

## 2025-01-17 ENCOUNTER — Ambulatory Visit

## 2025-04-18 ENCOUNTER — Ambulatory Visit

## 2025-07-15 ENCOUNTER — Encounter: Admitting: Family Medicine

## 2025-07-18 ENCOUNTER — Ambulatory Visit

## 2025-10-17 ENCOUNTER — Ambulatory Visit
